# Patient Record
Sex: Male | Born: 1975 | Marital: Single | Smoking: Never smoker
Health system: Southern US, Community
[De-identification: ages and names within clinical notes are randomized; demographics above are authoritative.]

## PROBLEM LIST (undated history)

## (undated) DIAGNOSIS — K219 Gastro-esophageal reflux disease without esophagitis: Secondary | ICD-10-CM

## (undated) DIAGNOSIS — I10 Essential (primary) hypertension: Secondary | ICD-10-CM

## (undated) DIAGNOSIS — M545 Low back pain, unspecified: Secondary | ICD-10-CM

## (undated) DIAGNOSIS — F419 Anxiety disorder, unspecified: Secondary | ICD-10-CM

## (undated) DIAGNOSIS — G4700 Insomnia, unspecified: Secondary | ICD-10-CM

## (undated) DIAGNOSIS — J302 Other seasonal allergic rhinitis: Secondary | ICD-10-CM

## (undated) DIAGNOSIS — E669 Obesity, unspecified: Secondary | ICD-10-CM

## (undated) DIAGNOSIS — N200 Calculus of kidney: Secondary | ICD-10-CM

## (undated) DIAGNOSIS — E785 Hyperlipidemia, unspecified: Secondary | ICD-10-CM

## (undated) HISTORY — DX: Essential (primary) hypertension: I10

## (undated) HISTORY — DX: Gastro-esophageal reflux disease without esophagitis: K21.9

## (undated) HISTORY — DX: Low back pain, unspecified: M54.50

## (undated) HISTORY — PX: NO PAST SURGERIES: SHX2092

## (undated) HISTORY — DX: Obesity, unspecified: E66.9

## (undated) HISTORY — DX: Hyperlipidemia, unspecified: E78.5

## (undated) HISTORY — DX: Calculus of kidney: N20.0

---

## 2006-09-16 ENCOUNTER — Ambulatory Visit: Payer: Self-pay | Admitting: Specialist

## 2007-06-09 ENCOUNTER — Emergency Department: Payer: Self-pay

## 2007-06-10 ENCOUNTER — Emergency Department: Payer: Self-pay | Admitting: Internal Medicine

## 2007-12-26 ENCOUNTER — Emergency Department (HOSPITAL_COMMUNITY): Admission: EM | Admit: 2007-12-26 | Discharge: 2007-12-26 | Payer: Self-pay | Admitting: Emergency Medicine

## 2008-12-14 ENCOUNTER — Ambulatory Visit: Payer: Self-pay | Admitting: Unknown Physician Specialty

## 2010-01-04 ENCOUNTER — Ambulatory Visit: Payer: Self-pay | Admitting: Internal Medicine

## 2011-06-12 ENCOUNTER — Ambulatory Visit (INDEPENDENT_AMBULATORY_CARE_PROVIDER_SITE_OTHER): Payer: BC Managed Care – PPO | Admitting: Internal Medicine

## 2011-06-12 ENCOUNTER — Encounter: Payer: Self-pay | Admitting: Internal Medicine

## 2011-06-12 DIAGNOSIS — G4700 Insomnia, unspecified: Secondary | ICD-10-CM

## 2011-06-12 DIAGNOSIS — E669 Obesity, unspecified: Secondary | ICD-10-CM

## 2011-06-12 DIAGNOSIS — I10 Essential (primary) hypertension: Secondary | ICD-10-CM

## 2011-06-12 DIAGNOSIS — E785 Hyperlipidemia, unspecified: Secondary | ICD-10-CM | POA: Insufficient documentation

## 2011-06-12 MED ORDER — ZOLPIDEM TARTRATE 10 MG PO TABS: 10.0000 mg | ORAL_TABLET | Freq: Every evening | ORAL | Status: DC | PRN

## 2011-06-12 NOTE — Assessment & Plan Note (Signed)
He has lost 15 lbs since his last visit through carbohydrate restriction and exercise .  Reviewed his exercise regimen and suggested that he decrease his regimen from 90 minutes to 60 and increase the frequency to 5 days per week.

## 2011-06-12 NOTE — Patient Instructions (Addendum)
Consider using G2 during your workouts to replace the electrolytes you are losing in your sweat.  Consider shortening your workouts to an hour and increasing the frequency to 5 tmes per week to boost your metabolism and prevent muscle injury  You are doing grreat!  We'll repeat your labs before the holidays.

## 2011-06-12 NOTE — Assessment & Plan Note (Signed)
improved control on current regimen

## 2011-06-12 NOTE — Assessment & Plan Note (Signed)
With elevated triglycerides and LDL by last check.  He would like another 3 months before rechecking.

## 2011-06-12 NOTE — Progress Notes (Signed)
  Subjective:    Patient ID: Mark Welch, male    DOB: September 04, 1976, 35 y.o.   MRN: [id number]  HPI  35 yo hispanic male with history of obesity, hypertension and GERD  With rcenent labs suggesting prediabetes and hypertrigyceridemia, presents for followup .  He has had episodes of  light headedness and muscle cramps.  Both improving with exercise and weight loss.  Has lost 15 lbs in the last month with a low carb diet and exercise .  Goal is 60 more lbs,  Exercising 3 days per week 90 minutes per session.     Review of Systems  Constitutional: Negative for fever, chills, diaphoresis, activity change, appetite change, fatigue and unexpected weight change.  HENT: Negative for hearing loss, ear pain, nosebleeds, congestion, sore throat, facial swelling, rhinorrhea, sneezing, drooling, mouth sores, trouble swallowing, neck pain, neck stiffness, dental problem, voice change, postnasal drip, sinus pressure, tinnitus and ear discharge.   Eyes: Negative for photophobia, pain, discharge, redness, itching and visual disturbance.  Respiratory: Negative for apnea, cough, choking, chest tightness, shortness of breath, wheezing and stridor.   Cardiovascular: Negative for chest pain, palpitations and leg swelling.  Gastrointestinal: Negative for nausea, vomiting, abdominal pain, diarrhea, constipation, blood in stool, abdominal distention, anal bleeding and rectal pain.  Genitourinary: Negative for dysuria, urgency, frequency, hematuria, flank pain, decreased urine volume, scrotal swelling, difficulty urinating and testicular pain.  Musculoskeletal: Positive for myalgias. Negative for back pain, joint swelling, arthralgias and gait problem.  Skin: Negative for color change, rash and wound.  Neurological: Positive for dizziness. Negative for tremors, seizures, syncope, speech difficulty, weakness, light-headedness, numbness and headaches.  Psychiatric/Behavioral: Negative for suicidal ideas, hallucinations,  behavioral problems, confusion, sleep disturbance, dysphoric mood, decreased concentration and agitation. The patient is not nervous/anxious.        Objective:   Physical Exam  Constitutional: He is oriented to person, place, and time.  HENT:  Head: Normocephalic and atraumatic.  Mouth/Throat: Oropharynx is clear and moist.  Eyes: Conjunctivae and EOM are normal.  Neck: Normal range of motion. Neck supple. No JVD present. No thyromegaly present.  Cardiovascular: Normal rate, regular rhythm and normal heart sounds.   Pulmonary/Chest: Effort normal and breath sounds normal. He has no wheezes. He has no rales.  Abdominal: Soft. Bowel sounds are normal. He exhibits no mass. There is no tenderness. There is no rebound.  Musculoskeletal: Normal range of motion. He exhibits no edema.  Neurological: He is alert and oriented to person, place, and time.  Skin: Skin is warm and dry.  Psychiatric: He has a normal mood and affect.          Assessment & Plan:  BP 127/87  Pulse 69  Temp(Src) 97.8 F (36.6 C) (Oral)  Resp 16  Ht 5\' 11"  (1.803 m)  Wt 266 lb 8 oz (120.884 kg)  BMI 37.17 kg/m2  SpO2 97%

## 2011-07-24 ENCOUNTER — Ambulatory Visit: Payer: Self-pay | Admitting: Specialist

## 2011-08-14 ENCOUNTER — Other Ambulatory Visit: Payer: Self-pay | Admitting: Internal Medicine

## 2011-08-21 ENCOUNTER — Other Ambulatory Visit: Payer: BC Managed Care – PPO

## 2011-09-13 ENCOUNTER — Other Ambulatory Visit: Payer: Self-pay | Admitting: Internal Medicine

## 2011-09-20 ENCOUNTER — Telehealth: Payer: Self-pay | Admitting: Internal Medicine

## 2011-09-20 DIAGNOSIS — E785 Hyperlipidemia, unspecified: Secondary | ICD-10-CM

## 2011-09-20 DIAGNOSIS — I10 Essential (primary) hypertension: Secondary | ICD-10-CM

## 2011-09-20 NOTE — Telephone Encounter (Signed)
[phone]  Pt wants to get lab before his appointment on 1/16

## 2011-09-20 NOTE — Telephone Encounter (Signed)
  Morrie Sheldon can you place order for fasting lipids, CM[facility] ? thanks

## 2011-09-23 NOTE — Telephone Encounter (Signed)
Left message asking patient to call back and schedule lab appt.

## 2011-09-23 NOTE — Telephone Encounter (Signed)
Patient returned call. Since it is a fasting lab he can not come this afternoon because he obviously has already eaten and per patient he can not come int tomorrow. I advised him to wait until his appt on wed.

## 2011-09-25 ENCOUNTER — Ambulatory Visit (INDEPENDENT_AMBULATORY_CARE_PROVIDER_SITE_OTHER): Payer: BC Managed Care – PPO | Admitting: Internal Medicine

## 2011-09-25 ENCOUNTER — Encounter: Payer: Self-pay | Admitting: Internal Medicine

## 2011-09-25 VITALS — BP 120/84 | HR 76 | Temp 97.3°F | Wt 274.0 lb

## 2011-09-25 DIAGNOSIS — J069 Acute upper respiratory infection, unspecified: Secondary | ICD-10-CM

## 2011-09-25 DIAGNOSIS — I10 Essential (primary) hypertension: Secondary | ICD-10-CM

## 2011-09-25 DIAGNOSIS — E785 Hyperlipidemia, unspecified: Secondary | ICD-10-CM

## 2011-09-25 DIAGNOSIS — Z23 Encounter for immunization: Secondary | ICD-10-CM

## 2011-09-25 DIAGNOSIS — Z0000 Encounter for general adult medical examination without abnormal findings: Secondary | ICD-10-CM

## 2011-09-25 DIAGNOSIS — E669 Obesity, unspecified: Secondary | ICD-10-CM

## 2011-09-25 LAB — COMPREHENSIVE METABOLIC PANEL
Alkaline Phosphatase: 51 U/L (ref 39–117)
BUN: 14 mg/dL (ref 6–23)
CO2: 22 mEq/L (ref 19–32)
Creatinine, Ser: 0.9 mg/dL (ref 0.4–1.5)
GFR: 96.77 mL/min (ref >60.00)
Glucose, Bld: 135 mg/dL — ABNORMAL HIGH (ref 70–99)
Sodium: 139 mEq/L (ref 135–145)
Total Bilirubin: 1.5 mg/dL — ABNORMAL HIGH (ref 0.3–1.2)
Total Protein: 7.9 g/dL (ref 6.0–8.3)

## 2011-09-25 LAB — HEMOGLOBIN A1C: Hgb A1c MFr Bld: 5.9 % (ref 4.6–6.5)

## 2011-09-25 MED ORDER — AMOXICILLIN-POT CLAVULANATE 875-125 MG PO TABS: 1.0000 | ORAL_TABLET | Freq: Two times a day (BID) | ORAL | Status: AC

## 2011-09-25 NOTE — Patient Instructions (Signed)

## 2011-09-25 NOTE — Progress Notes (Signed)
Subjective:    Patient ID: Mark Welch, male    DOB: [birth year], 36 y.o.   MRN: [id number]  HPI  Mark Welch is a 36 year old Hispanic male with a history of hypertension hyperlipidemia and obesity as well as allergic rhinitis who presents with presents with scratchy throat , rhinitis with clear to yellow/green drainage, and minimal sneezing for the last 2 or 3 days.  He denies any history of productive cough fevers or night sweats and significant myalgias. He has no history of recent sick contacts. He has not had an influenza vaccine this year.  Past Medical History  Diagnosis Date  . Obesity (BMI 30-39.9)   . Hypertension   . Hyperlipidemia   . Kidney stones   . GERD (gastroesophageal reflux disease)     Current Outpatient Prescriptions on File Prior to Visit  Medication Sig Dispense Refill  . amitriptyline (ELAVIL) 25 MG tablet TAKE ONE TABLET BY MOUTH AT BEDTIME  90 tablet  3  . cetirizine (ZYRTEC) 10 MG tablet Take 10 mg by mouth daily.        Marland Kitchen losartan (COZAAR) 50 MG tablet TAKE ONE TABLET BY MOUTH EVERY DAY FOR BLOOD PRESSURE  30 tablet  3  . omeprazole (PRILOSEC) 20 MG capsule Take 20 mg by mouth daily.        Marland Kitchen zolpidem (AMBIEN) 10 MG tablet Take 1 tablet (10 mg total) by mouth at bedtime as needed.  30 tablet  5    Review of Systems  Constitutional: Negative for fever, chills, diaphoresis, activity change, appetite change, fatigue and unexpected weight change.  HENT: Positive for congestion, rhinorrhea and sneezing. Negative for hearing loss, ear pain, nosebleeds, sore throat, facial swelling, drooling, mouth sores, trouble swallowing, neck pain, neck stiffness, dental problem, voice change, postnasal drip, sinus pressure, tinnitus and ear discharge.   Eyes: Negative for photophobia, pain, discharge, redness, itching and visual disturbance.  Respiratory: Negative for apnea, cough, choking, chest tightness, shortness of breath, wheezing and stridor.   Cardiovascular:  Negative for chest pain, palpitations and leg swelling.  Gastrointestinal: Negative for nausea, vomiting, abdominal pain, diarrhea, constipation, blood in stool, abdominal distention, anal bleeding and rectal pain.  Genitourinary: Negative for dysuria, urgency, frequency, hematuria, flank pain, decreased urine volume, scrotal swelling, difficulty urinating and testicular pain.  Musculoskeletal: Negative for myalgias, back pain, joint swelling, arthralgias and gait problem.  Skin: Negative for color change, rash and wound.  Neurological: Negative for dizziness, tremors, seizures, syncope, speech difficulty, weakness, light-headedness, numbness and headaches.  Psychiatric/Behavioral: Negative for suicidal ideas, hallucinations, behavioral problems, confusion, sleep disturbance, dysphoric mood, decreased concentration and agitation. The patient is not nervous/anxious.    BP 120/84  Pulse 76  Temp(Src) 97.3 F (36.3 C) (Oral)  Wt 274 lb (124.286 kg)  SpO2 96%    Objective:   Physical Exam  Constitutional: He is oriented to person, place, and time.  HENT:  Head: Normocephalic and atraumatic.  Mouth/Throat: Oropharynx is clear and moist.  Eyes: Conjunctivae and EOM are normal.  Neck: Normal range of motion. Neck supple. No JVD present. No thyromegaly present.  Cardiovascular: Normal rate, regular rhythm and normal heart sounds.   Pulmonary/Chest: Effort normal and breath sounds normal. He has no wheezes. He has no rales.  Abdominal: Soft. Bowel sounds are normal. He exhibits no mass. There is no tenderness. There is no rebound.  Musculoskeletal: Normal range of motion. He exhibits no edema.  Neurological: He is alert and oriented to person, place, and time.  Skin:  Skin is warm and dry.  Psychiatric: He has a normal mood and affect.      Assessment & Plan:   Acute URI This history and clinical exam are consistent with a viral upper respiratory infection indication and printed information  about management of this illness has been given. He has been given a prescription for an antibiotic if his symptoms were to worsen to the point that he developed fevers above 100.4 severe facial pain or ear pain or purulent discharge that was blood streaked or greenish brown in nature.  Hypertension Well controlled on current regimen no changes today.  Assessment of renal function due today.  Hyperlipidemia He has a history of hypertriglyceridemia and mild hyperlipidemia. He is overdue for fasting labs but is not fasting today. We'll check an LDL today and if significantly elevated we'll have him return for fasting lipid panel.  Obesity (BMI 30-39.9) He has been counseled about his obesity and is making a concerted effort to reduce his weight through diet and exercise. Currently he is exercising 2-3 times daily and following a low carbohydrate diet.    Updated Medication List Outpatient Encounter Prescriptions as of 09/25/2011  Medication Sig Dispense Refill  . amitriptyline (ELAVIL) 25 MG tablet TAKE ONE TABLET BY MOUTH AT BEDTIME  90 tablet  3  . cetirizine (ZYRTEC) 10 MG tablet Take 10 mg by mouth daily.        Marland Kitchen losartan (COZAAR) 50 MG tablet TAKE ONE TABLET BY MOUTH EVERY DAY FOR BLOOD PRESSURE  30 tablet  3  . omeprazole (PRILOSEC) 20 MG capsule Take 20 mg by mouth daily.        Marland Kitchen zolpidem (AMBIEN) 10 MG tablet Take 1 tablet (10 mg total) by mouth at bedtime as needed.  30 tablet  5  . amoxicillin-clavulanate (AUGMENTIN) 875-125 MG per tablet Take 1 tablet by mouth 2 (two) times daily.  14 tablet  0  . DISCONTD: losartan (COZAAR) 25 MG tablet Take 50 mg by mouth daily.

## 2011-09-27 DIAGNOSIS — J069 Acute upper respiratory infection, unspecified: Secondary | ICD-10-CM | POA: Insufficient documentation

## 2011-09-27 NOTE — Assessment & Plan Note (Signed)
This history and clinical exam are consistent with a viral upper respiratory infection indication and printed information about management of this illness has been given. He has been given a prescription for an antibiotic if his symptoms were to worsen to the point that he developed fevers above 100.4 severe facial pain or ear pain or purulent discharge that was blood streaked or greenish brown in nature.

## 2011-09-27 NOTE — Assessment & Plan Note (Signed)
He has been counseled about his obesity and is making a concerted effort to reduce his weight through diet and exercise. Currently he is exercising 2-3 times daily and following a low carbohydrate diet.

## 2011-09-27 NOTE — Assessment & Plan Note (Signed)
He has a history of hypertriglyceridemia and mild hyperlipidemia. He is overdue for fasting labs but is not fasting today. We'll check an LDL today and if significantly elevated we'll have him return for fasting lipid panel.

## 2011-09-27 NOTE — Assessment & Plan Note (Signed)
Well controlled on current regimen no changes today.  Assessment of renal function due today.

## 2011-12-11 ENCOUNTER — Other Ambulatory Visit: Payer: Self-pay | Admitting: Internal Medicine

## 2012-01-01 ENCOUNTER — Other Ambulatory Visit (INDEPENDENT_AMBULATORY_CARE_PROVIDER_SITE_OTHER): Payer: BC Managed Care – PPO | Admitting: *Deleted

## 2012-01-01 DIAGNOSIS — I10 Essential (primary) hypertension: Secondary | ICD-10-CM

## 2012-01-01 DIAGNOSIS — E669 Obesity, unspecified: Secondary | ICD-10-CM

## 2012-01-01 DIAGNOSIS — E785 Hyperlipidemia, unspecified: Secondary | ICD-10-CM

## 2012-01-01 LAB — HEMOGLOBIN A1C: Hgb A1c MFr Bld: 6.2 % (ref 4.6–6.5)

## 2012-01-01 LAB — COMPREHENSIVE METABOLIC PANEL
ALT: 41 U/L (ref 0–53)
AST: 32 U/L (ref 0–37)
Albumin: 3.8 g/dL (ref 3.5–5.2)
CO2: 26 mEq/L (ref 19–32)
Calcium: 9.3 mg/dL (ref 8.4–10.5)
Chloride: 105 mEq/L (ref 96–112)
Potassium: 4.2 mEq/L (ref 3.5–5.1)
Total Protein: 7.9 g/dL (ref 6.0–8.3)

## 2012-01-01 LAB — LIPID PANEL: Total CHOL/HDL Ratio: 7

## 2012-01-02 ENCOUNTER — Other Ambulatory Visit: Payer: Self-pay | Admitting: Internal Medicine

## 2012-01-03 MED ORDER — ZOLPIDEM TARTRATE 10 MG PO TABS: 10.0000 mg | ORAL_TABLET | Freq: Every evening | ORAL | Status: DC | PRN

## 2012-01-03 MED ORDER — OMEPRAZOLE 40 MG PO CPDR: 40.0000 mg | DELAYED_RELEASE_CAPSULE | Freq: Every day | ORAL | Status: DC

## 2012-01-03 MED ORDER — LOSARTAN POTASSIUM 50 MG PO TABS: 50.0000 mg | ORAL_TABLET | Freq: Every day | ORAL | Status: DC

## 2012-07-21 ENCOUNTER — Other Ambulatory Visit: Payer: Self-pay | Admitting: Internal Medicine

## 2012-07-22 ENCOUNTER — Other Ambulatory Visit: Payer: Self-pay

## 2012-07-31 ENCOUNTER — Ambulatory Visit (INDEPENDENT_AMBULATORY_CARE_PROVIDER_SITE_OTHER): Payer: BC Managed Care – PPO | Admitting: Internal Medicine

## 2012-07-31 ENCOUNTER — Encounter: Payer: Self-pay | Admitting: Internal Medicine

## 2012-07-31 VITALS — BP 130/80 | HR 90 | Temp 98.5°F | Resp 12 | Ht 71.5 in | Wt 271.5 lb

## 2012-07-31 DIAGNOSIS — I10 Essential (primary) hypertension: Secondary | ICD-10-CM

## 2012-07-31 DIAGNOSIS — E119 Type 2 diabetes mellitus without complications: Secondary | ICD-10-CM

## 2012-07-31 DIAGNOSIS — E785 Hyperlipidemia, unspecified: Secondary | ICD-10-CM

## 2012-07-31 DIAGNOSIS — E669 Obesity, unspecified: Secondary | ICD-10-CM

## 2012-07-31 DIAGNOSIS — M25539 Pain in unspecified wrist: Secondary | ICD-10-CM

## 2012-07-31 LAB — COMPREHENSIVE METABOLIC PANEL
ALT: 31 U/L (ref 0–53)
AST: 28 U/L (ref 0–37)
Albumin: 3.9 g/dL (ref 3.5–5.2)
CO2: 25 mEq/L (ref 19–32)
Calcium: 9.2 mg/dL (ref 8.4–10.5)
Chloride: 105 mEq/L (ref 96–112)
Creatinine, Ser: 1 mg/dL (ref 0.4–1.5)
GFR: 90.69 mL/min (ref >60.00)
Potassium: 3.9 mEq/L (ref 3.5–5.1)

## 2012-07-31 LAB — HEMOGLOBIN A1C: Hgb A1c MFr Bld: 6 % (ref 4.6–6.5)

## 2012-07-31 LAB — LIPID PANEL
Cholesterol: 218 mg/dL — ABNORMAL HIGH (ref 0–200)
Total CHOL/HDL Ratio: 8
VLDL: 46.4 mg/dL — ABNORMAL HIGH (ref 0.0–40.0)

## 2012-07-31 NOTE — Progress Notes (Signed)
Patient ID: Mark Welch, male   DOB: [birth year], 36 y.o.   MRN: [id number]  Patient Active Problem List  Diagnosis  . Obesity (BMI 30-39.9)  . Hypertension  . Hyperlipidemia  . Acute URI  . Diabetes mellitus type 2, diet-controlled    Subjective:  CC:   Chief Complaint  Patient presents with  . Medication Refill    HPI:   Mark Welch is a 36 y.o. male who presents for follow up on diet controlled diabetes mellitus, obesity, hypertension and dyslipidemia.  He has no specific complaints today he has not been following a low glycemic index diet or exercising regularly due to work and other distractions. He denies any joint pain and insomnia urinary frequency and chest pain. He does not check his blood sugars since he is diet controlled. He has no history of neuropathy and nephropathy or use of medications to control diabetes.   Past Medical History  Diagnosis Date  . Obesity (BMI 30-39.9)   . Hypertension   . Hyperlipidemia   . Kidney stones   . GERD (gastroesophageal reflux disease)     History reviewed. No pertinent past surgical history.       The following portions of the patient's history were reviewed and updated as appropriate: Allergies, current medications, and problem list.    Review of Systems:  Patient denies headache, fevers, malaise, unintentional weight loss, skin rash, eye pain, sinus congestion and sinus pain, sore throat, dysphagia,  hemoptysis , cough, dyspnea, wheezing, chest pain, palpitations, orthopnea, edema, abdominal pain, nausea, melena, diarrhea, constipation, flank pain, dysuria, hematuria, urinary  Frequency, nocturia, numbness, tingling, seizures,  Focal weakness, Loss of consciousness,  Tremor, insomnia, depression, anxiety, and suicidal ideation.       History   Social History  . Marital Status: Single    Spouse Name: N/A    Number of Children: N/A  . Years of Education: N/A   Occupational History  . Not on file.    Social History Main Topics  . Smoking status: Never Smoker   . Smokeless tobacco: Never Used  . Alcohol Use: No  . Drug Use: No  . Sexually Active: Not on file   Other Topics Concern  . Not on file   Social History Narrative  . No narrative on file    Objective:  BP 130/80  Pulse 90  Temp 98.5 F (36.9 C) (Oral)  Resp 12  Ht 5' 11.5" (1.816 m)  Wt 271 lb 8 oz (123.152 kg)  BMI 37.34 kg/m2  SpO2 97%  General appearance: alert, cooperative and appears stated age Ears: normal TM's and external ear canals both ears Throat: lips, mucosa, and tongue normal; teeth and gums normal Neck: no adenopathy, no carotid bruit, supple, symmetrical, trachea midline and thyroid not enlarged, symmetric, no tenderness/mass/nodules Back: symmetric, no curvature. ROM normal. No CVA tenderness. Lungs: clear to auscultation bilaterally Heart: regular rate and rhythm, S1, S2 normal, no murmur, click, rub or gallop Abdomen: soft, non-tender; bowel sounds normal; no masses,  no organomegaly Pulses: 2+ and symmetric Skin: Skin color, texture, turgor normal. No rashes or lesions Lymph nodes: Cervical, supraclavicular, and axillary nodes normal.  Assessment and Plan:  Obesity (BMI 30-39.9) His BMI remains unchanged  And is 37  We discussed the nature and quality of his exercise regimen as well his diet.  He should strive to achieve a sustained heart rate in the aerobic zone for 20 to 30 minutes,  5 days per week as a goal. .I  also am advising him to get back on the low GI diet using six smaller meals a day to stimulate his metabolism. He is not a candidate for appetite suppressants given his high resting heart rate and history of hypertension   Hyperlipidemia He has dyslipidemia with elevated triglycerides, LDLof 166  and low HDL. Low GI diet recommended. Will recommend statin therapy with pravastatin for starting therapy and repeat lipids in 3 months   Diabetes mellitus type 2,  diet-controlled Diagnosis with fasting glucose of 135 in January. His diabetes remains well controlled on diet alone. Hemoglobin A1c 6.0. There is no signs of proteinuria or nephropathy. Foot exam today was normal.  Hypertension Well controlled on current medications.  No changes today.   Updated Medication List Outpatient Encounter Prescriptions as of 07/31/2012  Medication Sig Dispense Refill  . amitriptyline (ELAVIL) 25 MG tablet TAKE ONE TABLET BY MOUTH AT BEDTIME  90 tablet  3  . cetirizine (ZYRTEC) 10 MG tablet Take 10 mg by mouth daily.        Marland Kitchen losartan (COZAAR) 50 MG tablet TAKE ONE TABLET BY MOUTH EVERY DAY  30 tablet  2  . omeprazole (PRILOSEC) 40 MG capsule Take 1 capsule (40 mg total) by mouth daily.  30 capsule  5  . zolpidem (AMBIEN) 10 MG tablet TAKE ONE TABLET BY MOUTH AT BEDTIME AS NEEDED  30 tablet  2  . omeprazole (PRILOSEC) 20 MG capsule Take 20 mg by mouth daily.        . pravastatin (PRAVACHOL) 20 MG tablet Take 1 tablet (20 mg total) by mouth daily.  90 tablet  3     Orders Placed This Encounter  Procedures  . DG Wrist Complete Left  . Hemoglobin A1c  . Lipid panel  . Comprehensive metabolic panel  . TSH  . Microalbumin / creatinine urine ratio  . LDL cholesterol, direct  . HM DIABETES EYE EXAM    No Follow-up on file.

## 2012-07-31 NOTE — Patient Instructions (Addendum)
Your weight has increased again.  I  Recommend you resume your exercise program with a goal of 25 lb st loss over the next 6 months.    This is  my version of a  "Low GI"  Diet:  All of the foods can be found at grocery stores and in bulk at [employer].  The Atkins protein bars and shakes are available in more varieties at [employer], [employer] and [employer].     7 AM Breakfast:  Low carbohydrate Protein  Shakes (I recommend the [employer] AdvantEdge "Carb Control" shakes  Or the low carb shakes by Atkins.   Both are available everywhere:  In  cases at [employer]  Or in 4 packs at grocery stores and pharmacies  2.5 carbs  (Alternative is  a toasted Arnold's Sandwhich Thin w/ peanut butter, a "Bagel Thin" with cream cheese and salmon) or  a scrambled egg burrito made with a low carb tortilla .  Avoid cereal and bananas, oatmeal too unless you are cooking the old fashioned kind that takes 30-40 minutes to prepare.  the rest is overly processed, has minimal fiber, and is loaded with carbohydrates!   10 AM: Protein bar by Atkins (the snack size, under 200 cal).  There are many varieties , available widely again or in bulk in limited varieties at [employer]Js)  Other so called "protein bars" tend to be loaded with carbohydrates.  Remember, in food advertising, the word "energy" is synonymous for " carbohydrate."  Lunch: sandwich of [place], (or any lunchmeat, grilled meat or canned tuna), fresh avocado, mayonnaise  and cheese on a lower carbohydrate pita bread, flatbread, or tortilla . Ok to use regular mayonnaise. The bread is the only source or carbohydrate that can be decreased (Joseph's makes a pita bread and a flat bread that are 50 cal and 4 net carbs ; Toufayan makes a low carb flatbread that's 100 cal and 9 net carbs  and  [place] makes a low carb whole wheat tortilla  That is 210 cal and 6 net carbs)  3 PM:  Mid day :  Another protein bar,  Or a  cheese stick (100 cal, 0 carbs),  Or 1 ounce of  almonds, walnuts, pistachios,  pecans, peanuts,  Macadamia nuts. Or a Dannon light n Fit greek yogurt, 80 cal 8 net carbs . Avoid "granola"; the dried cranberries and raisins are loaded with carbohydrates. Mixed nuts ok if no raisins or cranberries or dried fruit.      6 PM  Dinner:  "mean and green:"  Meat/chicken/fish or a high protein legume; , with a green salad, and a low GI  Veggie (broccoli, cauliflower, green beans, spinach, brussel sprouts. Lima beans) : Avoid "Low fat dressings, as well as Reyne Dumas and [address]! They are loaded with sugar! Instead use ranch, vinagrette,  Blue cheese, etc.  There is a low carb pasta by Dreamfield's available at [facility] that is acceptable and tastes great. Try Michel Angel's chicken piccata over low carb pasta. The chicken dish is 0 carbs, and can be found in frozen section at [employer]Js and [employer]. Also try [employer] "Carnitas" (pulled pork, no sauce,  0 carbs) and his pot roast.   both are in the refrigerated section at BJs   9 PM snack : Breyer's "low carb" fudgsicle or  ice cream bar (Carb Smart line), or  Weight Watcher's ice cream bar , or another "no sugar added" ice cream;a serving of fresh berries/cherries with whipped  cream (Avoid bananas, pineapple, grapes  and watermelon on a regular basis because they are high in sugar)   Remember that snack Substitutions should be less than 15 to 20 carbs  Per serving. Remember to subtract fiber grams and sugar alcohols to get the "net carbs."

## 2012-08-02 ENCOUNTER — Encounter: Payer: Self-pay | Admitting: Internal Medicine

## 2012-08-02 DIAGNOSIS — E119 Type 2 diabetes mellitus without complications: Secondary | ICD-10-CM | POA: Insufficient documentation

## 2012-08-02 MED ORDER — PRAVASTATIN SODIUM 20 MG PO TABS: 20.0000 mg | ORAL_TABLET | Freq: Every day | ORAL | Status: DC

## 2012-08-02 NOTE — Assessment & Plan Note (Signed)
Well controlled on current medications.  No changes today. 

## 2012-08-02 NOTE — Assessment & Plan Note (Addendum)
He has dyslipidemia with elevated triglycerides, LDLof 166  and low HDL. Low GI diet recommended. Will recommend statin therapy with pravastatin for starting therapy and repeat lipids in 3 months

## 2012-08-02 NOTE — Assessment & Plan Note (Signed)
Diagnosis with fasting glucose of 135 in January. His diabetes remains well controlled on diet alone. Hemoglobin A1c 6.0. There is no signs of proteinuria or nephropathy. Foot exam today was normal.

## 2012-08-02 NOTE — Assessment & Plan Note (Addendum)
His BMI remains unchanged  And is 37  We discussed the nature and quality of his exercise regimen as well his diet.  He should strive to achieve a sustained heart rate in the aerobic zone.I also am advising him to get back on the low GI diet using six smaller meals a day to stimulate his metabolism. He is not a candidate for appetite suppressants given his high resting heart rate and history of hypertension

## 2012-08-03 ENCOUNTER — Ambulatory Visit: Payer: Self-pay | Admitting: Internal Medicine

## 2012-08-04 ENCOUNTER — Telehealth: Payer: Self-pay | Admitting: Internal Medicine

## 2012-08-04 DIAGNOSIS — M25532 Pain in left wrist: Secondary | ICD-10-CM | POA: Insufficient documentation

## 2012-08-04 NOTE — Telephone Encounter (Signed)
There are no signs of erosive arthritis, fracture or dislocation by plain films.  Advise trial of tylenol and ibuprofen as needed

## 2012-08-04 NOTE — Assessment & Plan Note (Signed)
No signs of erosive arthritis, fracture or dislocation by plain films.

## 2012-08-04 NOTE — Telephone Encounter (Signed)
Patient advised as instructed 

## 2012-08-10 ENCOUNTER — Encounter: Payer: Self-pay | Admitting: Internal Medicine

## 2012-09-18 ENCOUNTER — Encounter: Payer: Self-pay | Admitting: Internal Medicine

## 2012-09-18 ENCOUNTER — Ambulatory Visit (INDEPENDENT_AMBULATORY_CARE_PROVIDER_SITE_OTHER): Payer: BC Managed Care – PPO | Admitting: Internal Medicine

## 2012-09-18 VITALS — BP 120/84 | HR 80 | Temp 98.2°F | Resp 16 | Wt 274.2 lb

## 2012-09-18 DIAGNOSIS — M7050 Other bursitis of knee, unspecified knee: Secondary | ICD-10-CM

## 2012-09-18 DIAGNOSIS — E669 Obesity, unspecified: Secondary | ICD-10-CM

## 2012-09-18 DIAGNOSIS — I10 Essential (primary) hypertension: Secondary | ICD-10-CM

## 2012-09-18 DIAGNOSIS — M76899 Other specified enthesopathies of unspecified lower limb, excluding foot: Secondary | ICD-10-CM

## 2012-09-18 LAB — HM DIABETES EYE EXAM: HM Diabetic Eye Exam: NORMAL

## 2012-09-18 MED ORDER — MELOXICAM 15 MG PO TABS: 15.0000 mg | ORAL_TABLET | Freq: Every day | ORAL | Status: DC

## 2012-09-18 NOTE — Progress Notes (Signed)
Patient ID: Mark Welch, male   DOB: 1976-07-01, 37 y.o.   MRN: [id number]  Patient Active Problem List  Diagnosis  . Obesity (BMI 30-39.9)  . Hypertension  . Hyperlipidemia  . Diabetes mellitus type 2, diet-controlled  . Wrist pain, left  . Bursitis of knee    Subjective:  CC:   Chief Complaint  Patient presents with  . Knee pain    HPI:   Mark Welch a 37 y.o. male who presents Recurrent knee pain ,  Left knee.  Also having pain in his left quadriceps muscle.  His knee pain is aggravated by walking.  Using the elliptical machine which does not bother it. He notices  Anterior swelling at times and popping,  No giving way. Has not tried NSAIDs daily but last night tried ibuprofen 200 mg dose which did not help .  No history of trauma.    Past Medical History  Diagnosis Date  . Obesity (BMI 30-39.9)   . Hypertension   . Hyperlipidemia   . Kidney stones   . GERD (gastroesophageal reflux disease)     History reviewed. No pertinent past surgical history.       The following portions of the patient's history were reviewed and updated as appropriate: Allergies, current medications, and problem list.    Review of Systems:   12 Pt  review of systems was negative except those addressed in the HPI,     History   Social History  . Marital Status: Single    Spouse Name: N/A    Number of Children: N/A  . Years of Education: N/A   Occupational History  . Not on file.   Social History Main Topics  . Smoking status: Never Smoker   . Smokeless tobacco: Never Used  . Alcohol Use: No  . Drug Use: No  . Sexually Active: Not on file   Other Topics Concern  . Not on file   Social History Narrative  . No narrative on file    Objective:  BP 120/84  Pulse 80  Temp 98.2 F (36.8 C) (Oral)  Resp 16  Wt 274 lb 4 oz (124.399 kg)  SpO2 96%  General appearance: alert, cooperative and appears stated age Ears: normal TM's and external ear canals both  ears Throat: lips, mucosa, and tongue normal; teeth and gums normal Neck: no adenopathy, no carotid bruit, supple, symmetrical, trachea midline and thyroid not enlarged, symmetric, no tenderness/mass/nodules Back: symmetric, no curvature. ROM normal. No CVA tenderness. Lungs: clear to auscultation bilaterally Heart: regular rate and rhythm, S1, S2 normal, no murmur, click, rub or gallop Abdomen: soft, non-tender; bowel sounds normal; no masses,  no organomegaly Pulses: 2+ and symmetric Skin: Skin color, texture, turgor normal. No rashes or lesions Lymph nodes: Cervical, supraclavicular, and axillary nodes normal. MSK: left knee; normal anatomic markings, no effusion, no ligament laxity.  Assessment and Plan:  Bursitis of knee Recommended therapeutic strength NSAIDs for four weeks and avoidance of squats and lunges.   Knee extensions ok with very light weights.  Ice knee after exercise.  Obesity (BMI 30-39.9) His BMI remains unchanged despite exercise. We discussed the nature and quality of her exercises well as of his diet. Recommended using the machines that adjust pace and resistance to maintain his pulse in the aerobic zone.   I also am advising him to get back on the low GI diet using six smaller meals a day to stimulate her metabolism.    Hypertension Well controlled on  current regimen. Renal function stable, no changes today.   Updated Medication List Outpatient Encounter Prescriptions as of 09/18/2012  Medication Sig Dispense Refill  . amitriptyline (ELAVIL) 25 MG tablet TAKE ONE TABLET BY MOUTH AT BEDTIME  90 tablet  3  . cetirizine (ZYRTEC) 10 MG tablet Take 10 mg by mouth daily.        Marland Kitchen losartan (COZAAR) 50 MG tablet TAKE ONE TABLET BY MOUTH EVERY DAY  30 tablet  2  . omeprazole (PRILOSEC) 40 MG capsule Take 1 capsule (40 mg total) by mouth daily.  30 capsule  5  . pravastatin (PRAVACHOL) 20 MG tablet Take 1 tablet (20 mg total) by mouth daily.  90 tablet  3  . zolpidem  (AMBIEN) 10 MG tablet TAKE ONE TABLET BY MOUTH AT BEDTIME AS NEEDED  30 tablet  2  . meloxicam (MOBIC) 15 MG tablet Take 1 tablet (15 mg total) by mouth daily.  90 tablet  1  . [DISCONTINUED] omeprazole (PRILOSEC) 20 MG capsule Take 20 mg by mouth daily.           No orders of the defined types were placed in this encounter.    No Follow-up on file.

## 2012-09-18 NOTE — Patient Instructions (Addendum)
You knee pain is due to bursitis.  We will treat for 4 to 6 weeks with NSAID (meloxicam)  You can still go to the gym but avoid lunges and squats,  Ok to use the  recumbent bike  Ok to add tylenol up to 200 mg daily max dose ( 500 or 1000 mg twice daily ok to add)   Ok to add knee extensions with very low weight to strengthen quads.   Ice your knee for 15 minutes after every workout.  If no better in 4 weeks ,  Call and we will set you up to see Dr Zachery Dauer,  Sports Medicine  Return inlate Feb or early march for lab visit to repeat fasting lipids, A1c.   Look for [employer] pasta to help lower carb intake ([employer],  [employer])

## 2012-09-20 ENCOUNTER — Encounter: Payer: Self-pay | Admitting: Internal Medicine

## 2012-09-20 NOTE — Assessment & Plan Note (Signed)
His BMI remains unchanged despite exercise. We discussed the nature and quality of her exercises well as of his diet. Recommended using the machines that adjust pace and resistance to maintain his pulse in the aerobic zone.   I also am advising him to get back on the low GI diet using six smaller meals a day to stimulate her metabolism.

## 2012-09-20 NOTE — Assessment & Plan Note (Signed)
Well controlled on current regimen. Renal function stable, no changes today. 

## 2012-09-20 NOTE — Assessment & Plan Note (Addendum)
Recommended therapeutic strength NSAIDs for four weeks and avoidance of squats and lunges.   Knee extensions ok with very light weights.  Ice knee after exercise.

## 2012-10-27 ENCOUNTER — Telehealth: Payer: Self-pay | Admitting: Internal Medicine

## 2012-10-27 DIAGNOSIS — E119 Type 2 diabetes mellitus without complications: Secondary | ICD-10-CM

## 2012-10-27 DIAGNOSIS — E785 Hyperlipidemia, unspecified: Secondary | ICD-10-CM

## 2012-10-27 NOTE — Telephone Encounter (Signed)
His insurance will not pay for his a1c becaseu it has not been 90 days  (11/22 was his last lab) , he needs to wait until  Feb 22

## 2012-10-27 NOTE — Telephone Encounter (Signed)
Left detailed message on patient's voice mail.

## 2012-10-27 NOTE — Addendum Note (Signed)
Addended by: Sherlene Shams on: 10/27/2012 05:34 PM   Modules accepted: Orders

## 2012-10-27 NOTE — Telephone Encounter (Signed)
Pt is coming in for labs tomorrow (02.19.2014) what labs and dx would you like? Thank you

## 2012-10-27 NOTE — Telephone Encounter (Signed)
Pt sched lab appt for 2/19, states should be fasting labs.

## 2012-10-28 ENCOUNTER — Other Ambulatory Visit: Payer: BC Managed Care – PPO

## 2012-11-02 ENCOUNTER — Other Ambulatory Visit (INDEPENDENT_AMBULATORY_CARE_PROVIDER_SITE_OTHER): Payer: BC Managed Care – PPO

## 2012-11-02 DIAGNOSIS — E785 Hyperlipidemia, unspecified: Secondary | ICD-10-CM

## 2012-11-02 DIAGNOSIS — E119 Type 2 diabetes mellitus without complications: Secondary | ICD-10-CM

## 2012-11-02 LAB — HEMOGLOBIN A1C: Hgb A1c MFr Bld: 6 % (ref 4.6–6.5)

## 2012-11-02 LAB — COMPREHENSIVE METABOLIC PANEL
ALT: 36 U/L (ref 0–53)
AST: 29 U/L (ref 0–37)
Albumin: 3.7 g/dL (ref 3.5–5.2)
Alkaline Phosphatase: 46 U/L (ref 39–117)
Calcium: 9.1 mg/dL (ref 8.4–10.5)
Chloride: 106 mEq/L (ref 96–112)
Potassium: 3.8 mEq/L (ref 3.5–5.1)
Sodium: 139 mEq/L (ref 135–145)
Total Protein: 7.5 g/dL (ref 6.0–8.3)

## 2012-11-02 LAB — LIPID PANEL
Cholesterol: 203 mg/dL — ABNORMAL HIGH (ref 0–200)
Total CHOL/HDL Ratio: 7

## 2012-11-05 ENCOUNTER — Ambulatory Visit (INDEPENDENT_AMBULATORY_CARE_PROVIDER_SITE_OTHER): Payer: BC Managed Care – PPO | Admitting: Internal Medicine

## 2012-11-05 ENCOUNTER — Encounter: Payer: Self-pay | Admitting: Internal Medicine

## 2012-11-05 VITALS — BP 120/98 | HR 85 | Temp 98.3°F | Resp 12 | Wt 274.0 lb

## 2012-11-05 DIAGNOSIS — E119 Type 2 diabetes mellitus without complications: Secondary | ICD-10-CM

## 2012-11-05 DIAGNOSIS — J069 Acute upper respiratory infection, unspecified: Secondary | ICD-10-CM

## 2012-11-05 DIAGNOSIS — E785 Hyperlipidemia, unspecified: Secondary | ICD-10-CM

## 2012-11-05 MED ORDER — ZOLPIDEM TARTRATE 10 MG PO TABS: 10.0000 mg | ORAL_TABLET | Freq: Every evening | ORAL | Status: DC | PRN

## 2012-11-05 MED ORDER — AMITRIPTYLINE HCL 25 MG PO TABS: 25.0000 mg | ORAL_TABLET | Freq: Every day | ORAL | Status: DC

## 2012-11-05 MED ORDER — LOSARTAN POTASSIUM 50 MG PO TABS: 50.0000 mg | ORAL_TABLET | Freq: Every day | ORAL | Status: DC

## 2012-11-05 MED ORDER — OMEPRAZOLE 40 MG PO CPDR: 40.0000 mg | DELAYED_RELEASE_CAPSULE | Freq: Every day | ORAL | Status: DC

## 2012-11-05 NOTE — Progress Notes (Signed)
Patient ID: Mark Welch, male   DOB: May 26, 1976, 37 y.o.   MRN: [id number]  Patient Active Problem List  Diagnosis  . Obesity (BMI 30-39.9)  . Hypertension  . Dyslipidemia (high LDL; low HDL)  . Diabetes mellitus type 2, diet-controlled  . Wrist pain, left  . Bursitis of knee  . Acute upper respiratory infections of unspecified site    Subjective:  CC:   Chief Complaint  Patient presents with  . Follow-up    HPI:   Mark Welch a 37 y.o. male who presents  For three-month followup on diabetes mellitus, hypertension, hyperlipidemia, and obesity. He has been exercising regularly and following a low glycemic index diet. He has had no low blood sugars. He continues to have occasional wrist and knee pain due to bursitis. This is managed with meloxicam. He has been noticing a dry throat in the mornings for the last week which usually resolves by mid day  And is accompanied by sinus congestion without purulent drainage .  He has been told that he snores.   Past Medical History  Diagnosis Date  . Obesity (BMI 30-39.9)   . Hypertension   . Hyperlipidemia   . Kidney stones   . GERD (gastroesophageal reflux disease)     History reviewed. No pertinent past surgical history.     The following portions of the patient's history were reviewed and updated as appropriate: Allergies, current medications, and problem list.    Review of Systems:  Patient denies headache, fevers, malaise, unintentional weight loss, skin rash, eye pain, sinus congestion and sinus pain, sore throat, dysphagia,  hemoptysis , cough, dyspnea, wheezing, chest pain, palpitations, orthopnea, edema, abdominal pain, nausea, melena, diarrhea, constipation, flank pain, dysuria, hematuria, urinary  Frequency, nocturia, numbness, tingling, seizures,  Focal weakness, Loss of consciousness,  Tremor, insomnia, depression, anxiety, and suicidal ideation.     History   Social History  . Marital Status: Single    Spouse Name: N/A    Number of Children: N/A  . Years of Education: N/A   Occupational History  . Not on file.   Social History Main Topics  . Smoking status: Never Smoker   . Smokeless tobacco: Never Used  . Alcohol Use: No  . Drug Use: No  . Sexually Active: Not on file   Other Topics Concern  . Not on file   Social History Narrative  . No narrative on file    Objective:  BP 120/98  Pulse 85  Temp(Src) 98.3 F (36.8 C) (Tympanic)  Resp 12  Wt 274 lb (124.286 kg)  BMI 37.69 kg/m2  SpO2 98%  General appearance: alert, cooperative and appears stated age Ears: normal TM's and external ear canals both ears Throat: lips, mucosa, and tongue normal; teeth and gums normal Neck: no adenopathy, no carotid bruit, supple, symmetrical, trachea midline and thyroid not enlarged, symmetric, no tenderness/mass/nodules Back: symmetric, no curvature. ROM normal. No CVA tenderness. Lungs: clear to auscultation bilaterally Heart: regular rate and rhythm, S1, S2 normal, no murmur, click, rub or gallop Abdomen: soft, non-tender; bowel sounds normal; no masses,  no organomegaly Pulses: 2+ and symmetric Skin: Skin color, texture, turgor normal. No rashes or lesions Lymph nodes: Cervical, supraclavicular, and axillary nodes normal.  Assessment and Plan:  Diabetes mellitus type 2, diet-controlled Well-controlled on diet alone   hemoglobin A1c has been consistently less than 7.0 . He is up-to-date on eye exams and his foot exam is normal  He has a normal urine microalbumin to creatinine  ratio . He is on the appropriate medications he was reminded to take a baby aspirin daily.  Dyslipidemia (high LDL; low HDL) He has lowered his LDL to 135 and raised his HDL to 30 using diet and exercise and low dose pravastatin . New ACC guidelines recommend starting patients aged 23 or higher on moderate intensity statin therapy for diabetes and concurrent LDLbetween 70-189. He's currently taking pravastatin  20 mg daily. Given his young age and lack of strong family history we will continue pravastatin at this time .    Acute upper respiratory infections of unspecified site His exam is benign in his symptoms are mild.. Advised to use oral and nasal decongestants,  Ibuprofen 400 mg and tylenol 650 mq 8 hrs for aches and pains,  Delsym every 8 hours prn cough  Advised to call for  abx only if symptoms worsen to include fevers, facial pain, purulent sputum./drainage.    Updated Medication List Outpatient Encounter Prescriptions as of 11/05/2012  Medication Sig Dispense Refill  . amitriptyline (ELAVIL) 25 MG tablet Take 1 tablet (25 mg total) by mouth at bedtime.  90 tablet  3  . cetirizine (ZYRTEC) 10 MG tablet Take 10 mg by mouth daily.        Marland Kitchen losartan (COZAAR) 50 MG tablet Take 1 tablet (50 mg total) by mouth daily.  90 tablet  3  . meloxicam (MOBIC) 15 MG tablet Take 1 tablet (15 mg total) by mouth daily.  90 tablet  1  . omeprazole (PRILOSEC) 40 MG capsule Take 1 capsule (40 mg total) by mouth daily.  90 capsule  3  . pravastatin (PRAVACHOL) 20 MG tablet Take 1 tablet (20 mg total) by mouth daily.  90 tablet  3  . zolpidem (AMBIEN) 10 MG tablet Take 1 tablet (10 mg total) by mouth at bedtime as needed.  30 tablet  2  . [DISCONTINUED] amitriptyline (ELAVIL) 25 MG tablet TAKE ONE TABLET BY MOUTH AT BEDTIME  90 tablet  3  . [DISCONTINUED] losartan (COZAAR) 50 MG tablet TAKE ONE TABLET BY MOUTH EVERY DAY  30 tablet  2  . [DISCONTINUED] omeprazole (PRILOSEC) 40 MG capsule Take 1 capsule (40 mg total) by mouth daily.  30 capsule  5  . [DISCONTINUED] zolpidem (AMBIEN) 10 MG tablet TAKE ONE TABLET BY MOUTH AT BEDTIME AS NEEDED  30 tablet  2  . aspirin 81 MG EC tablet Take 1 tablet (81 mg total) by mouth daily. Swallow whole.  30 tablet  12   No facility-administered encounter medications on file as of 11/05/2012.     Orders Placed This Encounter  Procedures  . HM DIABETES FOOT EXAM    No  Follow-up on file.

## 2012-11-05 NOTE — Patient Instructions (Addendum)
Try Sudafed PE 10 mg  (up to 3 every 6 hours for congestion)  Try Simply Saline to flush Sinuses   If you develop ear  Or facial pain ,  Or bloody nasal dsicahrge call for an antibiotic  Your cholewterol and diabetes are excellent!

## 2012-11-07 ENCOUNTER — Encounter: Payer: Self-pay | Admitting: Internal Medicine

## 2012-11-07 DIAGNOSIS — J069 Acute upper respiratory infection, unspecified: Secondary | ICD-10-CM | POA: Insufficient documentation

## 2012-11-07 MED ORDER — ASPIRIN 81 MG PO TBEC: 81.0000 mg | DELAYED_RELEASE_TABLET | Freq: Every day | ORAL | Status: DC

## 2012-11-07 NOTE — Assessment & Plan Note (Signed)
He has lowered his LDL to 135 and raised his HDL to 30 using diet and exercise and low dose pravastatin . New ACC guidelines recommend starting patients aged 39 or higher on moderate intensity statin therapy for diabetes and concurrent LDLbetween 70-189. He's currently taking pravastatin 20 mg daily. Given his young age and lack of strong family history we will continue pravastatin at this time .

## 2012-11-07 NOTE — Assessment & Plan Note (Addendum)
Well-controlled on diet alone   hemoglobin A1c has been consistently less than 7.0 . He is up-to-date on eye exams and his foot exam is normal  He has a normal urine microalbumin to creatinine ratio . He is on the appropriate medications he was reminded to take a baby aspirin daily.

## 2012-11-07 NOTE — Assessment & Plan Note (Signed)
His exam is benign in his symptoms are mild.. Advised to use oral and nasal decongestants,  Ibuprofen 400 mg and tylenol 650 mq 8 hrs for aches and pains,  Delsym every 8 hours prn cough  Advised to call for  abx only if symptoms worsen to include fevers, facial pain, purulent sputum./drainage.

## 2012-11-10 ENCOUNTER — Telehealth: Payer: Self-pay | Admitting: Internal Medicine

## 2012-11-10 NOTE — Telephone Encounter (Signed)
Caller: Chrisotpher/Patient; PCP: Duncan Dull (Adults only); CB#: [phone]; Call regarding Sore Throat;   Today, 11/10/2012 , pt calling  stating he had physical on 11/05/2012  and had mentioned dry throat , with sinus congestion x 1 week. Still  with scratchy throat/ nasal congestion, but hasn't had tried decongestant as suggested by Dr. Darrick Huntsman.  Also with frontal headache, but hasn't tried Tylenol either.  RN reached See in 24 hours  pressure/ pain above eyes with yellow/ green drainage per Upper Respiratory Infection protocol.  Pt wants to try home care tonight and  wants note sent to Dr. Darrick Huntsman about antibiotic that she mentioned at last OV.   RN advised note to Dr. Darrick Huntsman will be sent  after hours , but will not be seen until 11/11/2012  when office reopens. OFFICE FOLLOWUP REQUEST

## 2012-11-12 ENCOUNTER — Ambulatory Visit (INDEPENDENT_AMBULATORY_CARE_PROVIDER_SITE_OTHER): Payer: BC Managed Care – PPO | Admitting: Adult Health

## 2012-11-12 ENCOUNTER — Telehealth: Payer: Self-pay | Admitting: Internal Medicine

## 2012-11-12 ENCOUNTER — Encounter: Payer: Self-pay | Admitting: Adult Health

## 2012-11-12 VITALS — BP 147/89 | HR 85 | Temp 98.5°F | Resp 14 | Ht 72.0 in | Wt 278.0 lb

## 2012-11-12 DIAGNOSIS — J069 Acute upper respiratory infection, unspecified: Secondary | ICD-10-CM

## 2012-11-12 MED ORDER — AMOXICILLIN-POT CLAVULANATE 875-125 MG PO TABS: 1.0000 | ORAL_TABLET | Freq: Two times a day (BID) | ORAL | Status: DC

## 2012-11-12 MED ORDER — FLUTICASONE PROPIONATE 50 MCG/ACT NA SUSP: 2.0000 | Freq: Every day | NASAL | Status: DC

## 2012-11-12 NOTE — Telephone Encounter (Signed)
Patient Information:  Caller Name: [place]  Phone: [phone]  Patient: Mark Welch, Mark Welch  Gender: Male  DOB: 05-14-1976  Age: 37 Years  PCP: Duncan Dull (Adults only)  Office Follow Up:  Does the office need to follow up with this patient?: No  Instructions For The Office: N/A   Symptoms  Reason For Call & Symptoms: Seen in office last week on 11/05/12 for Physical and dx with virus. Advised to take Muscinex and use Saline nasal spray and sx worsening.  Sinus Headaches and has Sore Throat since 11/08/12. Last night he woke up frequently, he was very congested and throat sore and dry. Eyes puffy and feeling pressure. Greenish yellow nasal drainage.  Reviewed Health History In EMR: Yes  Reviewed Medications In EMR: Yes  Reviewed Allergies In EMR: Yes  Reviewed Surgeries / Procedures: Yes  Date of Onset of Symptoms: 12/03/2012  Treatments Tried: Saline nasal spray, Musinex, Tylenol Cold and Sinus 1 tab PO q 6 hr prn- helps some  Treatments Tried Worked: Not Really  Guideline(s) Used:  Headache  Sinus Pain and Congestion  Disposition Per Guideline:   Go to Office Now  Reason For Disposition Reached:   Redness or swelling on the cheek, forehead, or around the eye  Advice Given:  Reassurance:   Sinus congestion is a normal part of a cold.  Usually home treatment with nasal washes can prevent an actual bacterial sinus infection.  Nasal Decongestants for a Very Stuffy or Runny Nose:  If you have a very stuffy nose, nasal decongestant medicines can shrink the swollen nasal mucosa and allow for easier breathing. If you have a very runny nose, these medicines can reduce the amount of drainage. They may be taken as pills by mouth or as a nasal spray.  Most people do Not need to use these medicines. If your nose feels blocked, you should try using nasal washes first.  Pain and Fever Medicines:  For pain or fever relief, take either acetaminophen or ibuprofen.  They are  over-the-counter (OTC) drugs that help treat both fever and pain. You can buy them at the drugstore.  Pain and Fever Medicines:  For pain or fever relief, take either acetaminophen or ibuprofen.  They are over-the-counter (OTC) drugs that help treat both fever and pain. You can buy them at the drugstore.  Acetaminophen (e.g., Tylenol):  Regular Strength Tylenol: Take 650 mg (two 325 mg pills) by mouth every 4-6 hours as needed. Each Regular Strength Tylenol pill has 325 mg of acetaminophen.  Extra Strength Tylenol: Take 1,000 mg (two 500 mg pills) every 8 hours as needed. Each Extra Strength Tylenol pill has 500 mg of acetaminophen.  The most you should take each day is 3,000 mg (10 Regular Strength or 6 Extra Strength pills a day).  Hydration:  Drink plenty of liquids (6-8 glasses of water daily). If the air in your home is dry, use a cool mist humidifier  Expected Course:  Sinus congestion from viral upper respiratory infections (colds) usually lasts 5-10 days.  Occasionally a cold can worsen and turn into bacterial sinusitis. Clues to this are sinus symptoms lasting longer than 10 days, fever lasting longer than 3 days, and worsening pain. Bacterial sinusitis may need antibiotic treatment.  Call Back If:   Severe pain lasts longer than 2 hours after pain medicine  Sinus pain lasts longer than 1 day after starting treatment using nasal washes  Sinus congestion (fullness) lasts longer than 10 days  Fever lasts longer  than 3 days  You become worse.  Appointment Scheduled:  11/12/2012 14:00:00 Appointment Scheduled Provider:  Orville Govern

## 2012-11-12 NOTE — Progress Notes (Signed)
  Subjective:    Patient ID: Mark Welch, male    DOB: Jan 13, 1976, 37 y.o.   MRN: [id number]  HPI  Patient is a pleasant 37 y/o male who presents to clinic today with c/o sinus congestion, sore throat, post nasal drip, headache and mild cough. Denies shortness of breath or wheezing. Symptoms started over the weekend. Patient has tried mucinex. He has also been using saline spray. He does not feel that the OTC medication is helping him.   Current Outpatient Prescriptions on File Prior to Visit  Medication Sig Dispense Refill  . amitriptyline (ELAVIL) 25 MG tablet Take 1 tablet (25 mg total) by mouth at bedtime.  90 tablet  3  . meloxicam (MOBIC) 15 MG tablet Take 1 tablet (15 mg total) by mouth daily.  90 tablet  1  . omeprazole (PRILOSEC) 40 MG capsule Take 1 capsule (40 mg total) by mouth daily.  90 capsule  3  . pravastatin (PRAVACHOL) 20 MG tablet Take 1 tablet (20 mg total) by mouth daily.  90 tablet  3  . zolpidem (AMBIEN) 10 MG tablet Take 1 tablet (10 mg total) by mouth at bedtime as needed.  30 tablet  2  . aspirin 81 MG EC tablet Take 1 tablet (81 mg total) by mouth daily. Swallow whole.  30 tablet  12  . cetirizine (ZYRTEC) 10 MG tablet Take 10 mg by mouth daily.        Marland Kitchen losartan (COZAAR) 50 MG tablet Take 1 tablet (50 mg total) by mouth daily.  90 tablet  3   No current facility-administered medications on file prior to visit.     Review of Systems  Constitutional: Negative for fever and chills.  HENT: Positive for congestion, sore throat, postnasal drip and sinus pressure.   Respiratory: Positive for cough. Negative for chest tightness, shortness of breath and wheezing.   Cardiovascular: Negative for chest pain.  Gastrointestinal: Negative for nausea, vomiting and diarrhea.  Neurological: Positive for headaches. Negative for dizziness and light-headedness.  Psychiatric/Behavioral: Negative.     BP 147/89  Pulse 85  Temp(Src) 98.5 F (36.9 C) (Oral)  Ht 6' (1.829  m)  Wt 278 lb (126.1 kg)  BMI 37.7 kg/m2  SpO2 97%     Objective:   Physical Exam  Constitutional: He is oriented to person, place, and time. No distress.  Overweight.  HENT:  Head: Normocephalic and atraumatic.  Right Ear: External ear normal.  Left Ear: External ear normal.  Pharyngeal erythema without exudate.  Cardiovascular: Normal rate, regular rhythm, normal heart sounds and intact distal pulses.  Exam reveals no gallop.   No murmur heard. Pulmonary/Chest: Effort normal and breath sounds normal. No respiratory distress. He has no wheezes. He has no rales.  Abdominal: Soft. Bowel sounds are normal.  Lymphadenopathy:    He has no cervical adenopathy.  Neurological: He is alert and oriented to person, place, and time.  Skin: Skin is warm and dry.  Psychiatric: He has a normal mood and affect. His behavior is normal. Judgment and thought content normal.          Assessment & Plan:

## 2012-11-12 NOTE — Telephone Encounter (Signed)
FYI Dr Tullo 

## 2012-11-12 NOTE — Assessment & Plan Note (Signed)
Suspect sinus infection. Will treat empirically with Augmentin x 7 days. Also, start flonase nasal spray 2 sprays in each nostril daily. RTC if no resolution of symptoms by Monday.

## 2012-11-12 NOTE — Patient Instructions (Addendum)
Please start your antibiotic today. You will take this twice a day for 7 days.  Also start your flonase nasal spray. You will do 2 sprays in each nostril once a day.  Please let us know if you are not improved by Monday.  What is sinusitis? - Sinusitis is a condition that can cause a stuffy nose, pain in the face, and yellow or green discharge (mucus) from the nose. The sinuses are hollow areas in the bones of the face. They have a thin lining that normally makes a small amount of mucus. When this lining gets infected, it swells and makes extra mucus. This causes symptoms.   Sinusitis can occur when a person gets sick with a cold. The germs causing the cold can also infect the sinuses. Many times, a person feels like his or her cold is getting better. But then he or she gets sinusitis and begins to feel sick again.  What are the symptoms of sinusitis? - Common symptoms of sinusitis include: Stuffy or blocked nose  Thick yellow or green discharge from the nose  Pain in the teeth  Pain or pressure in the face - This often feels worse when a person bends forward.   People with sinusitis can also have other symptoms that include: Fever  Cough  Trouble smelling  Ear pressure or fullness  Headache  Bad breath  Feeling tired   Most of the time, symptoms start to improve in 7 to 10 days.  Should I see a doctor or nurse? - See your doctor or nurse if your symptoms last more than 7 days, or if your symptoms get better at first but then get worse. Sometimes, sinusitis can lead to serious problems. See your doctor or nurse right away (do not wait 7 days) if you have: Fever higher than 102.26F (39.2C)  Sudden and severe pain in the face and head  Trouble seeing or seeing double  Trouble thinking clearly  Swelling or redness around 1 or both eyes  Trouble breathing or a stiff neck   Is there anything I can do on my own to feel better? - Yes. To reduce your symptoms, you can: Take an  over-the-counter pain reliever to reduce the pain  Rinse your nose and sinuses with salt water a few times a day - Ask your doctor or nurse about the best way to do this.  Use a decongestant nose spray - These sprays are sold in a pharmacy. But do not use decongestant nose sprays for more than 2 to 3 days in a row. Using them more than 3 days in a row can make symptoms worse.   You should NOT take an antihistamine for sinusitis. Common antihistamines include diphenhydramine (sample brand name: Benadryl), chlorpheniramine (sample brand name: Chlor-Trimeton), loratadine (sample brand name: Claritin), and cetirizine (sample brand name: Zyrtec). They can treat allergies, but not sinus infections, and could increase your discomfort by drying the lining of your nose and sinuses, or making you tired.   Your doctor might also prescribe a steroid nose spray to reduce the swelling in your nose. (Steroid nose sprays do not contain the same steroids that athletes take to build muscle.)  How is sinusitis treated? - Most of the time, sinusitis does not need to be treated with antibiotic medicines. This is because most sinusitis is caused by viruses - not bacteria - and antibiotics do not kill viruses. Many people get over sinus infections without antibiotics.  Some people with sinusitis do  need treatment with antibiotics. If your symptoms have not improved after 7 to 10 days, ask your doctor if you should take antibiotics. Your doctor might recommend that you wait 1 more week to see if your symptoms improve. But if you have symptoms such as a fever or a lot of pain, he or she might prescribe antibiotics. It is important to follow your doctor's instructions about taking your antibiotics.

## 2013-03-17 ENCOUNTER — Encounter: Payer: Self-pay | Admitting: Adult Health

## 2013-03-17 ENCOUNTER — Ambulatory Visit (INDEPENDENT_AMBULATORY_CARE_PROVIDER_SITE_OTHER): Payer: BC Managed Care – PPO | Admitting: Adult Health

## 2013-03-17 ENCOUNTER — Other Ambulatory Visit: Payer: Self-pay | Admitting: Internal Medicine

## 2013-03-17 VITALS — BP 138/92 | HR 100 | Temp 98.0°F | Resp 12 | Ht 71.5 in | Wt 280.0 lb

## 2013-03-17 DIAGNOSIS — E669 Obesity, unspecified: Secondary | ICD-10-CM

## 2013-03-17 DIAGNOSIS — F419 Anxiety disorder, unspecified: Secondary | ICD-10-CM | POA: Insufficient documentation

## 2013-03-17 DIAGNOSIS — I10 Essential (primary) hypertension: Secondary | ICD-10-CM

## 2013-03-17 DIAGNOSIS — E785 Hyperlipidemia, unspecified: Secondary | ICD-10-CM

## 2013-03-17 DIAGNOSIS — E119 Type 2 diabetes mellitus without complications: Secondary | ICD-10-CM

## 2013-03-17 DIAGNOSIS — Z0000 Encounter for general adult medical examination without abnormal findings: Secondary | ICD-10-CM

## 2013-03-17 DIAGNOSIS — G479 Sleep disorder, unspecified: Secondary | ICD-10-CM

## 2013-03-17 MED ORDER — OMEPRAZOLE 40 MG PO CPDR: 40.0000 mg | DELAYED_RELEASE_CAPSULE | Freq: Every day | ORAL | Status: DC

## 2013-03-17 MED ORDER — TRAZODONE 25 MG HALF TABLET: ORAL_TABLET | ORAL | Status: DC

## 2013-03-17 MED ORDER — PRAVASTATIN SODIUM 20 MG PO TABS: 20.0000 mg | ORAL_TABLET | Freq: Every day | ORAL | Status: DC

## 2013-03-17 MED ORDER — AMITRIPTYLINE HCL 25 MG PO TABS: 25.0000 mg | ORAL_TABLET | Freq: Every day | ORAL | Status: DC

## 2013-03-17 MED ORDER — LOSARTAN POTASSIUM 50 MG PO TABS: 50.0000 mg | ORAL_TABLET | Freq: Every day | ORAL | Status: DC

## 2013-03-17 NOTE — Assessment & Plan Note (Addendum)
Blood pressure slightly elevated today during the visit. Suspect element of white coat syndrome. Continue losartan. Check CBC and a metabolic panel

## 2013-03-17 NOTE — Assessment & Plan Note (Signed)
Difficulty with weight. He recently changed jobs and feels that he has more time to focus on eating properly. His previous job was extremely busy with minimal time for breaks which meant that he would go for hours without anything to eat. This resulted in overeating at the end of the day when he would get home. Discussed following Dr. Melina Schools diet which is carbohydrate control. I think he would do very well on this diet. Patient has copy of the diet. If he manages to drop some weight this would help not only his diabetes but also his cholesterol and blood pressure.

## 2013-03-17 NOTE — Telephone Encounter (Signed)
Pt states no longer taking Meloxicam.

## 2013-03-17 NOTE — Assessment & Plan Note (Signed)
Patient is on pravastatin and tolerating well. Check lipid panel.

## 2013-03-17 NOTE — Assessment & Plan Note (Signed)
Patient has been on Ambien 10 mg at bedtime as needed. He reports inability to sleep if he does not have Ambien. Insurance only provides 15 tablets per month. He would like something that he could take nightly. We will try trazodone 25 mg at bedtime as needed.

## 2013-03-17 NOTE — Assessment & Plan Note (Signed)
Patient's diabetes has been controlled with diet. Check hemoglobin A1c, microalbumin.

## 2013-03-17 NOTE — Progress Notes (Signed)
Subjective:    Patient ID: Mark Welch, male    DOB: 05/20/1976, 37 y.o.   MRN: [id number]  HPI  Patient is a pleasant 37 year old male with history of diabetes mellitus type 2 diet controlled, hypertension, hyperlipidemia, obesity who presents to clinic for his yearly physical. Patient recently started a new job and reports experiencing mild anxiety and sleep disturbance. He has been taking Ambien but needs something that he could take on a daily basis. Patient reports that since starting this new job he has been able to take adequate breaks and eat better. Patient reports that previously he would go for hours at a time without being able to stop for even a snack. Then he would get home starving and overeat. Again, since starting this new job he has been eating more regularly and feels that he has been able to focus on more healthier choices.  Past Medical History  Diagnosis Date  . Obesity (BMI 30-39.9)   . Hypertension   . Hyperlipidemia   . Kidney stones   . GERD (gastroesophageal reflux disease)     Health Maintenance:  Tdap - 07/2010  Flu shot - due 06/2013  Labs - Needs Hgb A1C, lipid panel, metabolic panel  Depression Screen - No feelings of sadness, hopelessness or anhedonia  Tobacco Use - Never  Alcohol Use - None  Dental Exams - Every 6 months  Vision Exam - Yearly  Exercise - Not currently  Diet - Discussed Dr. Melina Schools diet plan. He has a copy of her diet   Review of Systems  Constitutional: Negative.   HENT: Negative.        Dry throat. Pt was taking ambien.  Respiratory: Negative.   Cardiovascular: Negative.   Gastrointestinal: Negative.   Endocrine: Negative.   Genitourinary: Negative.   Musculoskeletal:       Left foot plantar fasciitis - followed by podiatry  Allergic/Immunologic:       Seasonal allergies. Allergic to maple trees. Takes year round antihistamines.  Neurological: Negative.   Hematological: Negative.   Psychiatric/Behavioral:  Positive for sleep disturbance. Negative for suicidal ideas, behavioral problems, confusion, self-injury and agitation. The patient is nervous/anxious.        Anxiety with new job and with travel. Difficulty sleeping.    BP 138/92  Pulse 100  Temp(Src) 98 F (36.7 C) (Oral)  Resp 12  Ht 5' 11.5" (1.816 m)  Wt 280 lb (127.007 kg)  BMI 38.51 kg/m2  SpO2 97%    Objective:   Physical Exam  Constitutional:  Overweight, pleasant male in no apparent distress  HENT:  Head: Normocephalic and atraumatic.  Right Ear: External ear normal.  Left Ear: External ear normal.  Nose: Nose normal.  Mouth/Throat: Oropharynx is clear and moist.  Eyes: Conjunctivae and EOM are normal. Pupils are equal, round, and reactive to light.  Neck: Normal range of motion. Neck supple. No tracheal deviation present. No thyromegaly present.  Cardiovascular: Normal rate, regular rhythm, normal heart sounds and intact distal pulses.  Exam reveals no gallop.   No murmur heard. Pulmonary/Chest: Effort normal and breath sounds normal. No respiratory distress. He has no wheezes. He has no rales.  Abdominal: Soft. Bowel sounds are normal. He exhibits no distension and no mass. There is no tenderness. There is no rebound and no guarding. Hernia confirmed negative in the right inguinal area and confirmed negative in the left inguinal area.  Genitourinary: Testes normal and penis normal. Right testis shows no mass, no swelling and no  tenderness. Right testis is descended. Left testis shows no mass, no swelling and no tenderness. Left testis is descended. No hypospadias, penile erythema or penile tenderness. No discharge found.  Lymphadenopathy:    He has no cervical adenopathy.       Right: No inguinal adenopathy present.       Left: No inguinal adenopathy present.       Assessment & Plan:

## 2013-03-17 NOTE — Patient Instructions (Addendum)
  Please return for your fasting labs. You may have water or black coffee prior.  We will notify you of the results once they are available.  I have sent in refills for your prescriptions to your pharmacy.  Please call with any questions or concerns.

## 2013-03-18 ENCOUNTER — Telehealth: Payer: Self-pay | Admitting: *Deleted

## 2013-03-18 NOTE — Telephone Encounter (Signed)
Ok to change to 50mg  1/2 tab nightly?

## 2013-03-18 NOTE — Telephone Encounter (Signed)
Message below came from:  [employer] number: [phone]  Fax number: [phone]

## 2013-03-18 NOTE — Telephone Encounter (Signed)
Pharmacy Note:  We cannot fet 25 mg strength please prescribe 50 mg, 100 mg or 150 mg strength for this patient   Trazodone 25 mg tab

## 2013-03-19 NOTE — Telephone Encounter (Signed)
Trazodone 50 mg tablets take 1/2 tab at bedtime as needed for sleep.

## 2013-03-22 NOTE — Telephone Encounter (Signed)
[employer] [facility] called, notified of new Rx.

## 2013-03-22 NOTE — Addendum Note (Signed)
Addended by: Chandra Batch E on: 03/22/2013 09:19 AM   Modules accepted: Orders

## 2013-06-18 ENCOUNTER — Ambulatory Visit (INDEPENDENT_AMBULATORY_CARE_PROVIDER_SITE_OTHER): Payer: Managed Care, Other (non HMO) | Admitting: Internal Medicine

## 2013-06-18 ENCOUNTER — Encounter: Payer: Self-pay | Admitting: Internal Medicine

## 2013-06-18 VITALS — BP 140/94 | HR 83 | Temp 98.1°F | Resp 14 | Wt 281.8 lb

## 2013-06-18 DIAGNOSIS — E669 Obesity, unspecified: Secondary | ICD-10-CM

## 2013-06-18 DIAGNOSIS — Z0000 Encounter for general adult medical examination without abnormal findings: Secondary | ICD-10-CM

## 2013-06-18 DIAGNOSIS — E119 Type 2 diabetes mellitus without complications: Secondary | ICD-10-CM

## 2013-06-18 DIAGNOSIS — G479 Sleep disorder, unspecified: Secondary | ICD-10-CM

## 2013-06-18 DIAGNOSIS — E785 Hyperlipidemia, unspecified: Secondary | ICD-10-CM

## 2013-06-18 DIAGNOSIS — Z23 Encounter for immunization: Secondary | ICD-10-CM

## 2013-06-18 DIAGNOSIS — I10 Essential (primary) hypertension: Secondary | ICD-10-CM

## 2013-06-18 LAB — BASIC METABOLIC PANEL
BUN: 13 mg/dL (ref 6–23)
CO2: 27 mEq/L (ref 19–32)
Calcium: 9.2 mg/dL (ref 8.4–10.5)
Chloride: 104 mEq/L (ref 96–112)
Creatinine, Ser: 0.9 mg/dL (ref 0.4–1.5)
GFR: 106.38 mL/min (ref >60.00)
Glucose, Bld: 105 mg/dL — ABNORMAL HIGH (ref 70–99)
Potassium: 4.2 mEq/L (ref 3.5–5.1)
Sodium: 139 mEq/L (ref 135–145)

## 2013-06-18 LAB — CBC WITH DIFFERENTIAL/PLATELET
Basophils Absolute: 0 10*3/uL (ref 0.0–0.1)
Eosinophils Absolute: 0 10*3/uL (ref 0.0–0.7)
Hemoglobin: 14.1 g/dL (ref 13.0–17.0)
Lymphocytes Relative: 22.2 % (ref 12.0–46.0)
MCHC: 34.1 g/dL (ref 30.0–36.0)
Monocytes Relative: 8.4 % (ref 3.0–12.0)
Neutrophils Relative %: 69.1 % (ref 43.0–77.0)
Platelets: 198 10*3/uL (ref 150.0–400.0)
RDW: 13.4 % (ref 11.5–14.6)

## 2013-06-18 LAB — HM DIABETES FOOT EXAM: HM Diabetic Foot Exam: NORMAL

## 2013-06-18 LAB — LIPID PANEL
Cholesterol: 230 mg/dL — ABNORMAL HIGH (ref 0–200)
HDL: 34.3 mg/dL — ABNORMAL LOW (ref >39.00)
Total CHOL/HDL Ratio: 7

## 2013-06-18 LAB — HEMOGLOBIN A1C: Hgb A1c MFr Bld: 6.6 % — ABNORMAL HIGH (ref 4.6–6.5)

## 2013-06-18 MED ORDER — LOSARTAN POTASSIUM 50 MG PO TABS: 100.0000 mg | ORAL_TABLET | Freq: Every day | ORAL | Status: DC

## 2013-06-18 MED ORDER — ESZOPICLONE 2 MG PO TABS: 2.0000 mg | ORAL_TABLET | Freq: Every day | ORAL | Status: DC

## 2013-06-18 NOTE — Patient Instructions (Signed)
I have increased your losartan to 100 mg daily  I have changed your sleep medicaiton to Lunesta  ,  You may increase the dose to 1.5 tablet in a week of it is not helping.   You need to lose weight .    Your first goal is to get your BMI < 30.  This means getting down to 210 lbs.   This is  One version of a  "Low GI"  Diet:  It will still lower your blood sugars and allow you to lose 4 to 8  lbs  per month if you follow it carefully.  Your goal with exercise is a minimum of 30 minutes of aerobic exercise 5 days per week (Walking does not count once it becomes easy!)    All of the foods can be found at grocery stores and in bulk at [employer].  The Atkins protein bars and shakes are available in more varieties at Target, WalMart and [employer]'s Foods.     7 AM Breakfast:  Choose from the following:  Low carbohydrate Protein  Shakes (I recommend the [employer]AS [employer]vantEdge "Carb Control" shakes  Or the low carb shakes by Atkins.    2.5 carbs   Arnold's "Sandwhich Thin"toasted  w/ peanut butter (no jelly: about 20 net carbs  "Bagel Thin" with cream cheese and salmon: about 20 carbs   a scrambled egg/bacon/cheese burrito made with Mission's "carb balance" whole wheat tortilla  (about 10 net carbs )   Avoid cereal and bananas, oatmeal and cream of wheat and grits. They are loaded with carbohydrates!   10 AM: high protein snack  Protein bar by Atkins (the snack size, under 200 cal, usually < 6 net carbs).    A stick of cheese:  Around 1 carb,  100 cal     Dannon Light n Fit [place] Yogurt  (80 cal, 8 carbs)  Other so called "protein bars" and Greek yogurts tend to be loaded with carbohydrates.  Remember, in food advertising, the word "energy" is synonymous for " carbohydrate."  Lunch:   A Sandwich using the bread choices listed, Can use any  Eggs,  lunchmeat, grilled meat or canned tuna), avocado, regular mayo/mustard  and cheese.  A Salad using blue cheese, ranch,  Goddess or vinagrette,  No croutons or  "confetti" and no "candied nuts" but regular nuts OK.   No pretzels or chips.  Pickles and miniature sweet peppers are a good low carb alternative that provide a "crunch"  The bread is the only source of carbohydrate in a sandwich and  can be decreased by trying some of these alternatives to traditional loaf bread  Joseph's makes a pita bread and a flat bread that are 50 cal and 4 net carbs available at BJs and WalMart.  This can be toasted to use with hummous as well  Toufayan makes a low carb flatbread that's 100 cal and 9 net carbs available at [employer] and [employer] makes 2 sizes of  Low carb whole wheat tortilla  (The large one is 210 cal and 6 net carbs) Avoid "Low fat dressings, as well as Reyne Dumas and [address] dressings They are loaded with sugar!   3 PM/ Mid day  Snack:  Consider  1 ounce of  almonds, walnuts, pistachios, pecans, peanuts,  Macadamia nuts or a nut medley.  Avoid "granola"; the dried cranberries and raisins are loaded with carbohydrates. Mixed nuts as long as there are no raisins,  cranberries  or dried fruit.     6 PM  Dinner:     Meat/fowl/fish with a green salad, and either broccoli, cauliflower, green beans, spinach, brussel sprouts or  Lima beans. DO NOT BREAD THE PROTEIN!!      There is a low carb pasta by Dreamfield's that is acceptable and tastes great: only 5 digestible carbs/serving.( All grocery stores but BJs carry it )  Try Kai Levins Angelo's chicken piccata or chicken or eggplant parm over low carb pasta.(Lowes and BJs)   Clifton Custard Sanchez's "Carnitas" (pulled pork, no sauce,  0 carbs) or his beef pot roast to make a dinner burrito (at BJ's)  Pesto over low carb pasta (bj's sells a good quality pesto in the center refrigerated section of the deli   Whole wheat pasta is still full of digestible carbs and  Not as low in glycemic index as Dreamfield's.   Brown rice is still rice,  So skip the rice and noodles if you eat [place] or [place] (or at least limit  to 1/2 cup)  9 PM snack :   Breyer's "low carb" fudgsicle or  ice cream bar (Carb Smart line), or  Weight Watcher's ice cream bar , or another "no sugar added" ice cream;  a serving of fresh berries/cherries with whipped cream   Cheese or DANNON'S LlGHT N FIT [place] YOGURT  Avoid bananas, pineapple, grapes  and watermelon on a regular basis because they are high in sugar.  THINK OF THEM AS DESSERT  Remember that snack Substitutions should be less than 10 NET carbs per serving and meals < 20 carbs. Remember to subtract fiber grams to get the "net carbs."

## 2013-06-18 NOTE — Progress Notes (Signed)
Patient ID: Mark Welch, male   DOB: [birth year], 37 y.o.   MRN: [id number]    Patient Active Problem List   Diagnosis Date Noted  . Sleep disturbance 03/17/2013  . Bursitis of knee 09/18/2012  . Diabetes mellitus type 2, diet-controlled 08/02/2012  . Obesity (BMI 30-39.9)   . Hypertension   . Dyslipidemia (high LDL; low HDL)     Subjective:  CC:   Chief Complaint  Patient presents with  . Follow-up    refill medications    HPI:   [name] a 37 y.o. male who presents for follow up on diet controlled diabetes, hypertension, and obesity. He was last seen 8 months ago. He had an insurance lapse which complicated his ability to afford regular care. He notes that his blood pressure has been elevated lately. He has been checking it at home and at the pharmacy and has noted that his systolics have been between 130 and 140/90.  He does not check his blood sugars regularly. He has not been exercising lately due to persistent foot pain.  He was treated for plantar fasciitis at the [facility].  The  use of the nighttime boots and the injections have helped  improve his pain level but her symptoms are not completely resolved.Marland Kitchen  Has not been back in a few months bc of insurance lapse   he was treated recently by a nurse practitioner for insomnia. She changed his medication from [place] to trazdone  which has not been effective. He is interested in trying [place].    Past Medical History  Diagnosis Date  . Obesity (BMI 30-39.9)   . Hypertension   . Hyperlipidemia   . Kidney stones   . GERD (gastroesophageal reflux disease)     No past surgical history on file.     The following portions of the patient's history were reviewed and updated as appropriate: Allergies, current medications, and problem list.    Review of Systems:   12 Pt  review of systems was negative except those addressed in the HPI,     History   Social History  . Marital Status: Single     Spouse Name: N/A    Number of Children: N/A  . Years of Education: N/A   Occupational History  . Not on file.   Social History Main Topics  . Smoking status: Never Smoker   . Smokeless tobacco: Never Used  . Alcohol Use: No  . Drug Use: No  . Sexual Activity: Not on file   Other Topics Concern  . Not on file   Social History Narrative  . No narrative on file    Objective:  Filed Vitals:   06/18/13 1035  BP: 140/94  Pulse: 83  Temp: 98.1 F (36.7 C)  Resp: 14     General appearance: alert, cooperative and appears stated age Ears: normal TM's and external ear canals both ears Throat: lips, mucosa, and tongue normal; teeth and gums normal Neck: no adenopathy, no carotid bruit, supple, symmetrical, trachea midline and thyroid not enlarged, symmetric, no tenderness/mass/nodules Back: symmetric, no curvature. ROM normal. No CVA tenderness. Lungs: clear to auscultation bilaterally Heart: regular rate and rhythm, S1, S2 normal, no murmur, click, rub or gallop Abdomen: soft, non-tender; bowel sounds normal; no masses,  no organomegaly Pulses: 2+ and symmetric Skin: Skin color, texture, turgor normal. No rashes or lesions Lymph nodes: Cervical, supraclavicular, and axillary nodes normal. Foot exam:  Nails are well trimmed,  No callouses,  Sensation intact to microfilament  Assessment and Plan:  Obesity (BMI 30-39.9) Body mass index is 38.75 kg/(m^2). I have addressed  BMI and recommended wt loss of 10% of body weigh over the next 6 months using a low glycemic index diet and resuming regular exercise a minimum of 5 days per week.    Diabetes mellitus type 2, diet-controlled Well-controlled on diet alone .  hemoglobin A1c has been consistently less than 7.0 . Patient is up-to-date on eye exams and foot exam was done today.  There is  no proteinuria on today's micro urinalysis .  Fasting lipids have been reviewed and statin therapy has been advised and updated according to  new ACC guidelines based on patient's 10 year risk of CAD   Sleep disturbance Recent change from Ambien to trazodone was not successful in treating insomnia. Trial of Lunesta. We'll start with 2 mg daily.  Dyslipidemia (high LDL; low HDL) His LDL and triglycerides are not at goal on current pravastatin. New ACC guidelines recommend starting patients aged 84 or higher on moderate intensity statin therapy for diabetes and concurrent LDL between 70-189. Given his 10 year risk of coronary artery disease of 18%., I have recommended changing him to moderate intensity statin with simvastatin.   Updated Medication List Outpatient Encounter Prescriptions as of 06/18/2013  Medication Sig Dispense Refill  . amitriptyline (ELAVIL) 25 MG tablet Take 1 tablet (25 mg total) by mouth at bedtime.  90 tablet  3  . fexofenadine (ALLEGRA) 180 MG tablet Take 180 mg by mouth daily.      Marland Kitchen losartan (COZAAR) 50 MG tablet Take 2 tablets (100 mg total) by mouth daily.  90 tablet  3  . omeprazole (PRILOSEC) 40 MG capsule Take 1 capsule (40 mg total) by mouth daily.  90 capsule  3  . [DISCONTINUED] losartan (COZAAR) 50 MG tablet Take 1 tablet (50 mg total) by mouth daily.  90 tablet  3  . [DISCONTINUED] pravastatin (PRAVACHOL) 20 MG tablet Take 1 tablet (20 mg total) by mouth daily.  90 tablet  3  . eszopiclone (LUNESTA) 2 MG TABS tablet Take 1 tablet (2 mg total) by mouth at bedtime. Take immediately before bedtime  30 tablet  1  . simvastatin (ZOCOR) 40 MG tablet Take 1 tablet (40 mg total) by mouth at bedtime.  90 tablet  3  . [DISCONTINUED] traZODone (DESYREL) 50 MG tablet Take 25 mg by mouth at bedtime.       No facility-administered encounter medications on file as of 06/18/2013.     Orders Placed This Encounter  Procedures  . Flu Vaccine QUAD 36+ mos PF IM (Fluarix)  . LDL cholesterol, direct  . HM DIABETES EYE EXAM  . HM DIABETES FOOT EXAM    No Follow-up on file.

## 2013-06-20 ENCOUNTER — Telehealth: Payer: Self-pay | Admitting: Internal Medicine

## 2013-06-20 MED ORDER — SIMVASTATIN 40 MG PO TABS: 40.0000 mg | ORAL_TABLET | Freq: Every day | ORAL | Status: DC

## 2013-06-20 NOTE — Assessment & Plan Note (Addendum)
Well-controlled on diet alone .  hemoglobin A1c has been consistently less than 7.0 . Patient is up-to-date on eye exams and foot exam was done today.  There is  no proteinuria on today's micro urinalysis .  Fasting lipids have been reviewed and statin therapy has been advised and updated according to new ACC guidelines based on patient's 10 year risk of CAD  

## 2013-06-20 NOTE — Assessment & Plan Note (Signed)
Body mass index is 38.75 kg/(m^2). I have addressed  BMI and recommended wt loss of 10% of body weigh over the next 6 months using a low glycemic index diet and resuming regular exercise a minimum of 5 days per week.

## 2013-06-20 NOTE — Telephone Encounter (Signed)
Your diabetes remains  well-controlled on diet alone .  hemoglobin A1c has been consistently less than 7.0 .  There is  no proteinuria on today's micro urinalysis Fasting lipids have been reviewed and his current statin therapy is not sufficient. According to the new ACC guidelines based on your 10 year risk of Coronary artery disease you need to be on a stronger cholesterol medication.  I have sent an Rx for generic Zocor to [facility].   You may finish your current pravastatin prescription but then switch to the generic Zocor (simvastatin)   Return in 3 months,  get fasting labs prior if feasible.

## 2013-06-20 NOTE — Assessment & Plan Note (Signed)
Recent change from Ambien to trazodone was not successful in treating insomnia. Trial of Lunesta. We'll start with 2 mg daily.

## 2013-06-20 NOTE — Assessment & Plan Note (Addendum)
His LDL and triglycerides are not at goal on current pravastatin. New ACC guidelines recommend starting patients aged 39 or higher on moderate intensity statin therapy for diabetes and concurrent LDL between 70-189. Given his 10 year risk of coronary artery disease of 18%., I have recommended changing him to moderate intensity statin with simvastatin.

## 2013-06-21 NOTE — Telephone Encounter (Signed)
Patient notified as requested and appointment made for January .

## 2013-06-24 ENCOUNTER — Encounter: Payer: Self-pay | Admitting: *Deleted

## 2013-09-20 ENCOUNTER — Telehealth: Payer: Self-pay | Admitting: *Deleted

## 2013-09-20 NOTE — Telephone Encounter (Signed)
Pt is coming in tomorrow what labs and dx?  

## 2013-09-21 ENCOUNTER — Other Ambulatory Visit (INDEPENDENT_AMBULATORY_CARE_PROVIDER_SITE_OTHER): Payer: Managed Care, Other (non HMO)

## 2013-09-21 ENCOUNTER — Telehealth: Payer: Self-pay | Admitting: *Deleted

## 2013-09-21 DIAGNOSIS — E785 Hyperlipidemia, unspecified: Secondary | ICD-10-CM

## 2013-09-21 DIAGNOSIS — E119 Type 2 diabetes mellitus without complications: Secondary | ICD-10-CM

## 2013-09-21 LAB — COMPREHENSIVE METABOLIC PANEL
ALT: 39 U/L (ref 0–53)
AST: 32 U/L (ref 0–37)
Albumin: 3.7 g/dL (ref 3.5–5.2)
Alkaline Phosphatase: 49 U/L (ref 39–117)
BUN: 15 mg/dL (ref 6–23)
CO2: 26 mEq/L (ref 19–32)
CREATININE: 1 mg/dL (ref 0.4–1.5)
Calcium: 9.2 mg/dL (ref 8.4–10.5)
Chloride: 106 mEq/L (ref 96–112)
GFR: 90.15 mL/min (ref >60.00)
Glucose, Bld: 125 mg/dL — ABNORMAL HIGH (ref 70–99)
Potassium: 3.9 mEq/L (ref 3.5–5.1)
Sodium: 139 mEq/L (ref 135–145)
Total Bilirubin: 0.9 mg/dL (ref 0.3–1.2)
Total Protein: 7.3 g/dL (ref 6.0–8.3)

## 2013-09-21 LAB — LIPID PANEL
Cholesterol: 207 mg/dL — ABNORMAL HIGH (ref 0–200)
HDL: 33.4 mg/dL — ABNORMAL LOW (ref >39.00)
Total CHOL/HDL Ratio: 6
Triglycerides: 203 mg/dL — ABNORMAL HIGH (ref 0.0–149.0)
VLDL: 40.6 mg/dL — ABNORMAL HIGH (ref 0.0–40.0)

## 2013-09-21 LAB — HEMOGLOBIN A1C: HEMOGLOBIN A1C: 6.7 % — AB (ref 4.6–6.5)

## 2013-09-21 LAB — LDL CHOLESTEROL, DIRECT: Direct LDL: 147.5 mg/dL

## 2013-09-21 NOTE — Telephone Encounter (Signed)
Pt is coming in tomorrow what labs and dx?  

## 2013-09-23 ENCOUNTER — Encounter: Payer: Self-pay | Admitting: *Deleted

## 2013-09-24 ENCOUNTER — Ambulatory Visit (INDEPENDENT_AMBULATORY_CARE_PROVIDER_SITE_OTHER): Payer: Managed Care, Other (non HMO) | Admitting: Internal Medicine

## 2013-09-24 ENCOUNTER — Encounter: Payer: Self-pay | Admitting: Internal Medicine

## 2013-09-24 VITALS — BP 128/92 | HR 76 | Temp 97.9°F | Resp 14 | Wt 287.8 lb

## 2013-09-24 DIAGNOSIS — E785 Hyperlipidemia, unspecified: Secondary | ICD-10-CM

## 2013-09-24 DIAGNOSIS — I10 Essential (primary) hypertension: Secondary | ICD-10-CM

## 2013-09-24 DIAGNOSIS — G479 Sleep disorder, unspecified: Secondary | ICD-10-CM

## 2013-09-24 DIAGNOSIS — E119 Type 2 diabetes mellitus without complications: Secondary | ICD-10-CM

## 2013-09-24 DIAGNOSIS — E669 Obesity, unspecified: Secondary | ICD-10-CM

## 2013-09-24 LAB — HM DIABETES EYE EXAM

## 2013-09-24 MED ORDER — ESZOPICLONE 2 MG PO TABS: 2.0000 mg | ORAL_TABLET | Freq: Every day | ORAL | Status: DC

## 2013-09-24 MED ORDER — LOSARTAN POTASSIUM 100 MG PO TABS
100.0000 mg | ORAL_TABLET | Freq: Every day | ORAL | Status: DC
Start: 2013-09-24 — End: 2014-07-18

## 2013-09-24 NOTE — Assessment & Plan Note (Addendum)
Persistently elevated diastolic readings discussed today. He has been checking his blood pressures at home. His Home bps have been 130/80 or less on new dose of losartan. No changes today.

## 2013-09-24 NOTE — Progress Notes (Signed)
Pre-visit discussion using our clinic review tool. No additional management support is needed unless otherwise documented below in the visit note.  

## 2013-09-24 NOTE — Patient Instructions (Addendum)
Your diabetes and cholesterol are fine but your weight is the issue.  Your BMI is 39.  Your are almost "morbidly obese" my AMA standards.  I want you to lose 60 lbs over the next 12 months  to 218 lbs. This will lower your BMI to 30.    This is  Dr Melina Schoolsullo's version of a  "Low GI"  Diet:  It will still lower your blood sugars and allow you to lose 4 to 8  lbs  per month if you follow it carefully.  Your goal with exercise is a minimum of 30 minutes of aerobic exercise 5 days per week (Walking does not count once it becomes easy!)    All of the foods can be found at grocery stores and in bulk at [employer].  The Atkins protein bars and shakes are available in more varieties at Target, WalMart and [employer].     7 AM Breakfast:  Choose from the following:  Low carbohydrate Protein  Shakes (I recommend the EAS AdvantEdge "Carb Control" shakes  Or the low carb shakes by Atkins.    2.5 carbs   Arnold's "Sandwhich Thin"toasted  w/ peanut butter (no jelly: about 20 net carbs  "Bagel Thin" with cream cheese and salmon: about 20 carbs   a scrambled egg/bacon/cheese burrito made with Mission's "carb balance" whole wheat tortilla  (about 10 net carbs )   Avoid cereal and bananas, oatmeal and cream of wheat and grits. They are loaded with carbohydrates!   10 AM: high protein snack  Protein bar by Atkins (the snack size, under 200 cal, usually < 6 net carbs).    A stick of cheese:  Around 1 carb,  100 cal     Dannon Light n Fit [place] Yogurt  (80 cal, 8 carbs)  Other so called "protein bars" and Greek yogurts tend to be loaded with carbohydrates.  Remember, in food advertising, the word "energy" is synonymous for " carbohydrate."  Lunch:   A Sandwich using the bread choices listed, Can use any  Eggs,  lunchmeat, grilled meat or canned tuna), avocado, regular mayo/mustard  and cheese.  A Salad using blue cheese, ranch,  Goddess or vinagrette,  No croutons or "confetti" and no "candied nuts" but regular  nuts OK.   No pretzels or chips.  Pickles and miniature sweet peppers are a good low carb alternative that provide a "crunch"  The bread is the only source of carbohydrate in a sandwich and  can be decreased by trying some of these alternatives to traditional loaf bread  Joseph's makes a pita bread and a flat bread that are 50 cal and 4 net carbs available at BJs and WalMart.  This can be toasted to use with hummous as well  Toufayan makes a low carb flatbread that's 100 cal and 9 net carbs available at [employer] and [employer]  Mission makes 2 sizes of  Low carb whole wheat tortilla  (The large one is 210 cal and 6 net carbs) Avoid "Low fat dressings, as well as Reyne DumasCatalina and [address] dressings They are loaded with sugar!   3 PM/ Mid day  Snack:  Consider  1 ounce of  almonds, walnuts, pistachios, pecans, peanuts,  Macadamia nuts or a nut medley.  Avoid "granola"; the dried cranberries and raisins are loaded with carbohydrates. Mixed nuts as long as there are no raisins,  cranberries or dried fruit.     6 PM  Dinner:  Meat/fowl/fish with a green salad, and either broccoli, cauliflower, green beans, spinach, brussel sprouts or  Lima beans. DO NOT BREAD THE PROTEIN!!      There is a low carb pasta by Dreamfield's that is acceptable and tastes great: only 5 digestible carbs/serving.( All grocery stores but BJs carry it )  Try Hurley Cisco Angelo's chicken piccata or chicken or eggplant parm over low carb pasta.(Lowes and BJs)   Marjory Lies Sanchez's "Carnitas" (pulled pork, no sauce,  0 carbs) or his beef pot roast to make a dinner burrito (at [employer]J's)  Pesto over low carb pasta (bj's sells a good quality pesto in the center refrigerated section of the deli   Whole wheat pasta is still full of digestible carbs and  Not as low in glycemic index as Dreamfield's.   Brown rice is still rice,  So skip the rice and noodles if you eat [place] or [place] (or at least limit to 1/2 cup)  9 PM snack :   Breyer's "low  carb" fudgsicle or  ice cream bar (Carb Smart line), or  Weight Watcher's ice cream bar , or another "no sugar added" ice cream;  a serving of fresh berries/cherries with whipped cream   Cheese or DANNON'S LlGHT N [place] YOGURT  Avoid bananas, pineapple, grapes  and watermelon on a regular basis because they are high in sugar.  THINK OF THEM AS DESSERT  Remember that snack Substitutions should be less than 10 NET carbs per serving and meals < 20 carbs. Remember to subtract fiber grams to get the "net carbs."

## 2013-09-26 NOTE — Progress Notes (Signed)
Patient ID: Cyndee BrightlyJehovany Olesen, male   DOB: [birth year], 38 y.o.   MRN: [id number]  Patient Active Problem List   Diagnosis Date Noted  . Sleep disturbance 03/17/2013  . Bursitis of knee 09/18/2012  . Diabetes mellitus type 2, diet-controlled 08/02/2012  . Obesity (BMI 30-39.9)   . Hypertension   . Dyslipidemia (high LDL; low HDL)     Subjective:  CC:   Chief Complaint  Patient presents with  . Follow-up  . Diabetes    HPI:   Mark Welch a 38 y.o. male who presents for followup on type 2 diabetes, diet controlled,, care by obesity and dyslipidemia. He has no complaints today. He continues to follow a low glycemic index diet but has not been exercising regularly and has gained weight. His knee pain has improved.  He does not cite joint pain as a reason for the lack of exercise; he has just lost his momentum  due to the holidays. and exercise  Past Medical History  Diagnosis Date  . Obesity (BMI 30-39.9)   . Hypertension   . Hyperlipidemia   . Kidney stones   . GERD (gastroesophageal reflux disease)     No past surgical history on file.     The following portions of the patient's history were reviewed and updated as appropriate: Allergies, current medications, and problem list.    Review of Systems:   12 Pt  review of systems was negative except those addressed in the HPI,     History   Social History  . Marital Status: Single    Spouse Name: N/A    Number of Children: N/A  . Years of Education: N/A   Occupational History  . Not on file.   Social History Main Topics  . Smoking status: Never Smoker   . Smokeless tobacco: Never Used  . Alcohol Use: No  . Drug Use: No  . Sexual Activity: Not on file   Other Topics Concern  . Not on file   Social History Narrative  . No narrative on file    Objective:  Filed Vitals:   09/24/13 0821  BP: 128/92  Pulse: 76  Temp: 97.9 F (36.6 C)  Resp: 14     General appearance: alert, cooperative and  appears stated age Ears: normal TM's and external ear canals both ears Throat: lips, mucosa, and tongue normal; teeth and gums normal Neck: no adenopathy, no carotid bruit, supple, symmetrical, trachea midline and thyroid not enlarged, symmetric, no tenderness/mass/nodules Back: symmetric, no curvature. ROM normal. No CVA tenderness. Lungs: clear to auscultation bilaterally Heart: regular rate and rhythm, S1, S2 normal, no murmur, click, rub or gallop Abdomen: soft, non-tender; bowel sounds normal; no masses,  no organomegaly Pulses: 2+ and symmetric Skin: Skin color, texture, turgor normal. No rashes or lesions Lymph nodes: Cervical, supraclavicular, and axillary nodes normal.  Assessment and Plan: Hypertension Persistently elevated diastolic readings discussed today. He has been checking his blood pressures at home. His Home bps have been 130/80 or less on new dose of losartan. No changes today.  Dyslipidemia (high LDL; low HDL) He is taking simvastatin for estimated 10 year risk of coronary artery disease by Framingham risk calculator of 18%.  lfts are normal   Lab Results  Component Value Date   CHOL 207* 09/21/2013   HDL 33.40* 09/21/2013   LDLDIRECT 147.5 09/21/2013   TRIG 203.0* 09/21/2013   CHOLHDL 6 09/21/2013    Diabetes mellitus type 2, diet-controlled Well-controlled on diet alone .  hemoglobin A1c has been consistently less than 7.0 . Patient is up-to-date on eye exams and foot exam was normal today.  There is  no proteinuria on recent micro urinalysis .  Fasting lipids have been reviewed and statin therapy has been advised and updated according to new ACC guidelines based on patient's 10 year risk of CAD .    Lab Results  Component Value Date   MICROALBUR 1.47 06/18/2013    Lab Results  Component Value Date   HGBA1C 6.7* 09/21/2013     Obesity (BMI 30-39.9) I have addressed  BMI and recommended wt loss of 60 lbs over the next 12 months using a low glycemic index  diet and regular exercise a minimum of 5 days per week.     Updated Medication List Outpatient Encounter Prescriptions as of 09/24/2013  Medication Sig  . eszopiclone (LUNESTA) 2 MG TABS tablet Take 1 tablet (2 mg total) by mouth at bedtime. Take immediately before bedtime  . fexofenadine (ALLEGRA) 180 MG tablet Take 180 mg by mouth daily.  Marland Kitchen losartan (COZAAR) 100 MG tablet Take 1 tablet (100 mg total) by mouth daily.  Marland Kitchen omeprazole (PRILOSEC) 40 MG capsule Take 1 capsule (40 mg total) by mouth daily.  . simvastatin (ZOCOR) 40 MG tablet Take 1 tablet (40 mg total) by mouth at bedtime.  . [DISCONTINUED] eszopiclone (LUNESTA) 2 MG TABS tablet Take 1 tablet (2 mg total) by mouth at bedtime. Take immediately before bedtime  . [DISCONTINUED] losartan (COZAAR) 50 MG tablet Take 2 tablets (100 mg total) by mouth daily.  . [DISCONTINUED] amitriptyline (ELAVIL) 25 MG tablet Take 1 tablet (25 mg total) by mouth at bedtime.

## 2013-09-26 NOTE — Assessment & Plan Note (Signed)
Well-controlled on diet alone .  hemoglobin A1c has been consistently less than 7.0 . Patient is up-to-date on eye exams and foot exam was normal today.  There is  no proteinuria on recent micro urinalysis .  Fasting lipids have been reviewed and statin therapy has been advised and updated according to new ACC guidelines based on patient's 10 year risk of CAD .    Lab Results  Component Value Date   MICROALBUR 1.47 06/18/2013    Lab Results  Component Value Date   HGBA1C 6.7* 09/21/2013

## 2013-09-26 NOTE — Assessment & Plan Note (Signed)
I have addressed  BMI and recommended wt loss of 60 lbs over the next 12 months using a low glycemic index diet and regular exercise a minimum of 5 days per week.     

## 2013-09-26 NOTE — Assessment & Plan Note (Signed)
He is taking simvastatin for estimated 10 year risk of coronary artery disease by Framingham risk calculator of 18%.  lfts are normal   Lab Results  Component Value Date   CHOL 207* 09/21/2013   HDL 33.40* 09/21/2013   LDLDIRECT 147.5 09/21/2013   TRIG 203.0* 09/21/2013   CHOLHDL 6 09/21/2013

## 2013-09-28 ENCOUNTER — Telehealth: Payer: Self-pay

## 2013-09-28 NOTE — Telephone Encounter (Signed)
Relevant patient education mailed to patient.  

## 2013-10-07 LAB — HM DIABETES EYE EXAM

## 2013-10-13 ENCOUNTER — Telehealth: Payer: Self-pay | Admitting: Internal Medicine

## 2013-10-13 NOTE — Telephone Encounter (Signed)
Relevant patient education mailed to patient.  

## 2013-11-07 ENCOUNTER — Ambulatory Visit: Payer: Self-pay | Admitting: Emergency Medicine

## 2013-12-31 ENCOUNTER — Ambulatory Visit: Payer: Self-pay | Admitting: Family Medicine

## 2014-01-04 ENCOUNTER — Ambulatory Visit: Payer: Managed Care, Other (non HMO) | Admitting: Internal Medicine

## 2014-01-05 ENCOUNTER — Encounter: Payer: Self-pay | Admitting: Internal Medicine

## 2014-01-05 ENCOUNTER — Ambulatory Visit (INDEPENDENT_AMBULATORY_CARE_PROVIDER_SITE_OTHER): Payer: Managed Care, Other (non HMO) | Admitting: Internal Medicine

## 2014-01-05 VITALS — BP 120/90 | HR 90 | Temp 98.5°F | Resp 16 | Wt 290.8 lb

## 2014-01-05 DIAGNOSIS — E119 Type 2 diabetes mellitus without complications: Secondary | ICD-10-CM

## 2014-01-05 DIAGNOSIS — S43499A Other sprain of unspecified shoulder joint, initial encounter: Secondary | ICD-10-CM

## 2014-01-05 DIAGNOSIS — S46819A Strain of other muscles, fascia and tendons at shoulder and upper arm level, unspecified arm, initial encounter: Secondary | ICD-10-CM

## 2014-01-05 DIAGNOSIS — M62838 Other muscle spasm: Secondary | ICD-10-CM

## 2014-01-05 DIAGNOSIS — G479 Sleep disorder, unspecified: Secondary | ICD-10-CM

## 2014-01-05 DIAGNOSIS — I10 Essential (primary) hypertension: Secondary | ICD-10-CM

## 2014-01-05 DIAGNOSIS — E785 Hyperlipidemia, unspecified: Secondary | ICD-10-CM

## 2014-01-05 LAB — HM DIABETES FOOT EXAM: HM DIABETIC FOOT EXAM: NORMAL

## 2014-01-05 MED ORDER — NABUMETONE 750 MG PO TABS: 750.0000 mg | ORAL_TABLET | Freq: Two times a day (BID) | ORAL | Status: DC

## 2014-01-05 MED ORDER — ESZOPICLONE 2 MG PO TABS: 2.0000 mg | ORAL_TABLET | Freq: Every day | ORAL | Status: DC

## 2014-01-05 NOTE — Patient Instructions (Signed)
Your strained your trapezius muscle  Please stop the ibuprofen and use the nabumetone twice daily for inflammation  You can add  Tylenol 500 every twice daily as well  Save the cyclobenzaprine (muslce relaxer) for night time use since it is sedating  Try alternating between heat and ice applications  15 minutes each

## 2014-01-05 NOTE — Progress Notes (Signed)
Patient ID: Mark Welch, male   DOB: 17-Nov-1975, 38 y.o.   MRN: [id number]  Patient Active Problem List   Diagnosis Date Noted  . Trapezius muscle strain 01/08/2014  . Sleep disturbance 03/17/2013  . Bursitis of knee 09/18/2012  . Diabetes mellitus type 2, diet-controlled 08/02/2012  . Obesity (BMI 30-39.9)   . Hypertension   . Dyslipidemia (high LDL; low HDL)     Subjective:  CC:   Chief Complaint  Patient presents with  . Follow-up    C/O cervical strain [facility] [place] [facility]/13  . Diabetes    HPI:   Mark BrightlyJehovany Lokey is a 38 y.o. male who presents for acute and chronic issues  1) Neck pain.  He developed pain in the top of the scapula on left,  Radiated to side of neck no arm radiation no numbness or tingling.   Marland Kitchen.He was treated for cervical sprain by Urgent Care on Friday and treated with flexeril which has made him drowsy and referred him for [facility]   Cause of pain appears to be work related.  He : works at a [employer] for 6+ hrs ,  And Sleeps on right side . The pain improved initially but has not resolved. Has been taking advil 400 mg  Once daily   2) follow up on diet controlled diabetes, hypertension, and obesity.  He does not check his blood sugars regularly. He has not been exercising lately due to persistent foot pain.  3)  insomnia. He has been taking Lunesta since las visit    Past Medical History  Diagnosis Date  . Obesity (BMI 30-39.9)   . Hypertension   . Hyperlipidemia   . Kidney stones   . GERD (gastroesophageal reflux disease)     No past surgical history on file.     The following portions of the patient's history were reviewed and updated as appropriate: Allergies, current medications, and problem list.    Review of Systems:   Patient denies headache, fevers, malaise, unintentional weight loss, skin rash, eye pain, sinus congestion and sinus pain, sore throat, dysphagia,   hemoptysis , cough, dyspnea, wheezing, chest pain, palpitations, orthopnea, edema, abdominal pain, nausea, melena, diarrhea, constipation, flank pain, dysuria, hematuria, urinary  Frequency, nocturia, numbness, tingling, seizures,  Focal weakness, Loss of consciousness,  Tremor, insomnia, depression, anxiety, and suicidal ideation.     History   Social History  . Marital Status: Single    Spouse Name: N/A    Number of Children: N/A  . Years of Education: N/A   Occupational History  . Not on file.   Social History Main Topics  . Smoking status: Never Smoker   . Smokeless tobacco: Never Used  . Alcohol Use: No  . Drug Use: No  . Sexual Activity: Not on file   Other Topics Concern  . Not on file   Social History Narrative  . No narrative on file    Objective:  Filed Vitals:   01/05/14 1536  BP: 120/90  Pulse: 90  Temp: 98.5 F (36.9 C)  Resp: 16     General appearance: alert, cooperative and appears stated age Ears: normal TM's and external ear canals both ears Throat: lips, mucosa, and tongue normal; teeth and gums normal Neck: no adenopathy, no carotid bruit, supple, symmetrical, trachea midline and thyroid not enlarged, symmetric, no tenderness/mass/nodules Back: symmetric, no curvature. ROM normal. No CVA tenderness. Some tenderness over  trapezius muscles and scapula  Lungs: clear to auscultation bilaterally Heart: regular rate and rhythm, S1, S2 normal, no murmur, click, rub or gallop Abdomen: soft, non-tender; bowel sounds normal; no masses,  no organomegaly Pulses: 2+ and symmetric Skin: Skin color, texture, turgor normal. No rashes or lesions Lymph nodes: Cervical, supraclavicular, and axillary nodes normal.  Assessment and Plan:  Hypertension Well controlled on current regimen. Renal function stable, no changes today.  Diabetes mellitus type 2, diet-controlled Well-controlled on diet alone .  hemoglobin A1c has been consistently less than 7.0 . Patient  is up-to-date on eye exams and foot exam was normal today.  There is  no proteinuria on recent micro urinalysis .  Fasting lipids have been reviewed and statin therapy has been advised and updated according to new ACC guidelines based on patient's 10 year risk of CAD .    Lab Results  Component Value Date   MICROALBUR 0.7 01/05/2014    Lab Results  Component Value Date   HGBA1C 6.5 01/05/2014       Dyslipidemia (high LDL; low HDL) Well controlled on current statin therapy.   Liver enzymes are normal , no changes today.  Lab Results  Component Value Date   CHOL 207* 09/21/2013   HDL 33.40* 09/21/2013   LDLDIRECT 147.5 09/21/2013   TRIG 203.0* 09/21/2013   CHOLHDL 6 09/21/2013    \ Lab Results  Component Value Date   ALT 34 01/05/2014   AST 29 01/05/2014   ALKPHOS 47 01/05/2014   BILITOT 0.6 01/05/2014    Trapezius muscle strain NSAID prescribed.    Updated Medication List Outpatient Encounter Prescriptions as of 01/05/2014  Medication Sig  . cyclobenzaprine (FLEXERIL) 10 MG tablet Take 10 mg by mouth 3 (three) times daily as needed for muscle spasms.  . eszopiclone (LUNESTA) 2 MG TABS tablet Take 1 tablet (2 mg total) by mouth at bedtime. Take immediately before bedtime  . fexofenadine (ALLEGRA) 180 MG tablet Take 180 mg by mouth daily.  Marland Kitchen. losartan (COZAAR) 100 MG tablet Take 1 tablet (100 mg total) by mouth daily.  Marland Kitchen. omeprazole (PRILOSEC) 40 MG capsule Take 1 capsule (40 mg total) by mouth daily.  . simvastatin (ZOCOR) 40 MG tablet Take 1 tablet (40 mg total) by mouth at bedtime.  . [DISCONTINUED] eszopiclone (LUNESTA) 2 MG TABS tablet Take 1 tablet (2 mg total) by mouth at bedtime. Take immediately before bedtime  . nabumetone (RELAFEN) 750 MG tablet Take 1 tablet (750 mg total) by mouth 2 (two) times daily.     Orders Placed This Encounter  Procedures  . Hemoglobin A1c  . Microalbumin / creatinine urine ratio  . Comprehensive metabolic panel  . Magnesium  . HM  DIABETES EYE EXAM  . HM DIABETES FOOT EXAM    Return in about 3 months (around 04/06/2014).

## 2014-01-05 NOTE — Progress Notes (Signed)
Pre-visit discussion using our clinic review tool. No additional management support is needed unless otherwise documented below in the visit note.  

## 2014-01-06 ENCOUNTER — Encounter: Payer: Self-pay | Admitting: *Deleted

## 2014-01-06 LAB — COMPREHENSIVE METABOLIC PANEL
ALK PHOS: 47 U/L (ref 39–117)
ALT: 34 U/L (ref 0–53)
AST: 29 U/L (ref 0–37)
Albumin: 3.9 g/dL (ref 3.5–5.2)
BILIRUBIN TOTAL: 0.6 mg/dL (ref 0.3–1.2)
BUN: 14 mg/dL (ref 6–23)
CO2: 28 meq/L (ref 19–32)
Calcium: 9 mg/dL (ref 8.4–10.5)
Chloride: 105 mEq/L (ref 96–112)
Creatinine, Ser: 0.9 mg/dL (ref 0.4–1.5)
GFR: 94.45 mL/min (ref >60.00)
Glucose, Bld: 87 mg/dL (ref 70–99)
Potassium: 3.7 mEq/L (ref 3.5–5.1)
SODIUM: 138 meq/L (ref 135–145)
Total Protein: 7.5 g/dL (ref 6.0–8.3)

## 2014-01-06 LAB — HEMOGLOBIN A1C: HEMOGLOBIN A1C: 6.5 % (ref 4.6–6.5)

## 2014-01-06 LAB — MICROALBUMIN / CREATININE URINE RATIO
CREATININE, U: 119.2 mg/dL
MICROALB/CREAT RATIO: 0.6 mg/g (ref 0.0–30.0)
Microalb, Ur: 0.7 mg/dL (ref 0.0–1.9)

## 2014-01-06 LAB — MAGNESIUM: Magnesium: 1.9 mg/dL (ref 1.5–2.5)

## 2014-01-08 DIAGNOSIS — S46819A Strain of other muscles, fascia and tendons at shoulder and upper arm level, unspecified arm, initial encounter: Secondary | ICD-10-CM | POA: Insufficient documentation

## 2014-01-08 NOTE — Assessment & Plan Note (Signed)
Well controlled on current regimen. Renal function stable, no changes today. 

## 2014-01-08 NOTE — Assessment & Plan Note (Signed)
Well controlled on current statin therapy.   Liver enzymes are normal , no changes today.  Lab Results  Component Value Date   CHOL 207* 09/21/2013   HDL 33.40* 09/21/2013   LDLDIRECT 147.5 09/21/2013   TRIG 203.0* 09/21/2013   CHOLHDL 6 09/21/2013    \ Lab Results  Component Value Date   ALT 34 01/05/2014   AST 29 01/05/2014   ALKPHOS 47 01/05/2014   BILITOT 0.6 01/05/2014

## 2014-01-08 NOTE — Assessment & Plan Note (Signed)
Well-controlled on diet alone .  hemoglobin A1c has been consistently less than 7.0 . Patient is up-to-date on eye exams and foot exam was normal today.  There is  no proteinuria on recent micro urinalysis .  Fasting lipids have been reviewed and statin therapy has been advised and updated according to new ACC guidelines based on patient's 10 year risk of CAD .    Lab Results  Component Value Date   MICROALBUR 0.7 01/05/2014    Lab Results  Component Value Date   HGBA1C 6.5 01/05/2014

## 2014-01-08 NOTE — Assessment & Plan Note (Signed)
NSAID prescribed.

## 2014-01-18 ENCOUNTER — Encounter: Payer: Self-pay | Admitting: Internal Medicine

## 2014-02-07 ENCOUNTER — Encounter: Payer: Self-pay | Admitting: Internal Medicine

## 2014-03-23 ENCOUNTER — Ambulatory Visit: Payer: Self-pay | Admitting: Internal Medicine

## 2014-03-24 ENCOUNTER — Ambulatory Visit: Payer: Self-pay | Admitting: Internal Medicine

## 2014-03-25 LAB — WBCS, STOOL

## 2014-03-26 LAB — STOOL CULTURE

## 2014-04-01 ENCOUNTER — Ambulatory Visit: Payer: Self-pay | Admitting: Internal Medicine

## 2014-04-03 ENCOUNTER — Ambulatory Visit: Payer: Self-pay | Admitting: Internal Medicine

## 2014-05-04 ENCOUNTER — Other Ambulatory Visit: Payer: Self-pay | Admitting: *Deleted

## 2014-05-04 MED ORDER — OMEPRAZOLE 40 MG PO CPDR: 40.0000 mg | DELAYED_RELEASE_CAPSULE | Freq: Every day | ORAL | Status: DC

## 2014-05-17 LAB — CLOSTRIDIUM DIFFICILE(ARMC)

## 2014-05-24 ENCOUNTER — Ambulatory Visit: Payer: Managed Care, Other (non HMO) | Admitting: Internal Medicine

## 2014-06-28 ENCOUNTER — Ambulatory Visit (INDEPENDENT_AMBULATORY_CARE_PROVIDER_SITE_OTHER): Payer: Managed Care, Other (non HMO) | Admitting: Internal Medicine

## 2014-06-28 ENCOUNTER — Encounter: Payer: Self-pay | Admitting: Internal Medicine

## 2014-06-28 VITALS — BP 124/80 | HR 74 | Temp 98.4°F | Resp 16 | Ht 71.5 in | Wt 285.5 lb

## 2014-06-28 DIAGNOSIS — Z23 Encounter for immunization: Secondary | ICD-10-CM

## 2014-06-28 DIAGNOSIS — E784 Other hyperlipidemia: Secondary | ICD-10-CM

## 2014-06-28 DIAGNOSIS — E119 Type 2 diabetes mellitus without complications: Secondary | ICD-10-CM

## 2014-06-28 DIAGNOSIS — E785 Hyperlipidemia, unspecified: Secondary | ICD-10-CM

## 2014-06-28 DIAGNOSIS — F4010 Social phobia, unspecified: Secondary | ICD-10-CM

## 2014-06-28 DIAGNOSIS — E669 Obesity, unspecified: Secondary | ICD-10-CM

## 2014-06-28 LAB — COMPREHENSIVE METABOLIC PANEL
ALT: 33 U/L (ref 0–53)
AST: 26 U/L (ref 0–37)
Albumin: 3.3 g/dL — ABNORMAL LOW (ref 3.5–5.2)
Alkaline Phosphatase: 52 U/L (ref 39–117)
BILIRUBIN TOTAL: 0.8 mg/dL (ref 0.2–1.2)
BUN: 15 mg/dL (ref 6–23)
CO2: 24 meq/L (ref 19–32)
CREATININE: 1 mg/dL (ref 0.4–1.5)
Calcium: 9.4 mg/dL (ref 8.4–10.5)
Chloride: 103 mEq/L (ref 96–112)
GFR: 91.96 mL/min (ref >60.00)
Glucose, Bld: 104 mg/dL — ABNORMAL HIGH (ref 70–99)
Potassium: 3.7 mEq/L (ref 3.5–5.1)
Sodium: 141 mEq/L (ref 135–145)
Total Protein: 7.5 g/dL (ref 6.0–8.3)

## 2014-06-28 LAB — HEMOGLOBIN A1C: Hgb A1c MFr Bld: 6.4 % (ref 4.6–6.5)

## 2014-06-28 LAB — MICROALBUMIN / CREATININE URINE RATIO
CREATININE, U: 112.4 mg/dL
MICROALB/CREAT RATIO: 0.8 mg/g (ref 0.0–30.0)
Microalb, Ur: 0.9 mg/dL (ref 0.0–1.9)

## 2014-06-28 MED ORDER — PAROXETINE HCL 10 MG PO TABS: 10.0000 mg | ORAL_TABLET | Freq: Every day | ORAL | Status: DC

## 2014-06-28 MED ORDER — ALPRAZOLAM 0.5 MG PO TABS: 0.5000 mg | ORAL_TABLET | Freq: Every day | ORAL | Status: DC | PRN

## 2014-06-28 MED ORDER — ALPRAZOLAM 0.5 MG PO TBDP: 0.5000 mg | ORAL_TABLET | Freq: Two times a day (BID) | ORAL | Status: DC | PRN

## 2014-06-28 NOTE — Patient Instructions (Signed)
I am prescribing paxil to take daily for your social anxiety. Start with 1/2 a tablet daily for the first 4 days,  And increase to a full tablet if you do not experience any nausea.  Save the alprazolam for episodes of panic or extreme anxiety.  It can be sedating,  So start with 1/2 tablet and repeat dose in 20 minutes if not calmed down, .  DO NOT combine with alcohol or share with others because  it is a controlled substance.

## 2014-06-28 NOTE — Progress Notes (Signed)
Pre-visit discussion using our clinic review tool. No additional management support is needed unless otherwise documented below in the visit note.  

## 2014-06-28 NOTE — Progress Notes (Signed)
Patient ID: Mark Welch, male   DOB: [birth year], 38 y.o.   MRN: [id number]  Patient Active Problem List   Diagnosis Date Noted  . Childhood social anxiety disorder 06/29/2014  . Trapezius muscle strain 01/08/2014  . Sleep disturbance 03/17/2013  . Bursitis of knee 09/18/2012  . Diabetes mellitus type 2, diet-controlled 08/02/2012  . Obesity (BMI 30-39.9)   . Hypertension   . Dyslipidemia (high LDL; low HDL)     Subjective:  CC:   Chief Complaint  Patient presents with  . Follow-up    Medication refill  . Anxiety    Patient stated he is forgetting things, and having trouble with anxiety    HPI:   Mark Welch is a 38 y.o. male who presents for   Follow up on DM Type 2, , hypertension and obesity.  New issue:  Anxiety getting out of control ,  Occurring With social situations,  ocurring at work and with large family gatherings.  Reacts to crowds by trying to isolate himself,    History of painful shyness as an adolescent,   Feels like he is forgetting things  A lot,  Not with driving, more with home chores .  Working 40 hours per week,  Has 2 roommates   No panic attacks, but doesn't' handle small difficulties appropriately,    Past Medical History  Diagnosis Date  . Obesity (BMI 30-39.9)   . Hypertension   . Hyperlipidemia   . Kidney stones   . GERD (gastroesophageal reflux disease)     History reviewed. No pertinent past surgical history.     The following portions of the patient's history were reviewed and updated as appropriate: Allergies, current medications, and problem list.    Review of Systems:   Patient denies headache, fevers, malaise, unintentional weight loss, skin rash, eye pain, sinus congestion and sinus pain, sore throat, dysphagia,  hemoptysis , cough, dyspnea, wheezing, chest pain, palpitations, orthopnea, edema, abdominal pain, nausea, melena, diarrhea, constipation, flank pain, dysuria, hematuria, urinary   Frequency, nocturia, numbness, tingling, seizures,  Focal weakness, Loss of consciousness,  Tremor, insomnia, depression, anxiety, and suicidal ideation.     History   Social History  . Marital Status: Single    Spouse Name: N/A    Number of Children: N/A  . Years of Education: N/A   Occupational History  . Not on file.   Social History Main Topics  . Smoking status: Never Smoker   . Smokeless tobacco: Never Used  . Alcohol Use: No  . Drug Use: No  . Sexual Activity: Not on file   Other Topics Concern  . Not on file   Social History Narrative  . No narrative on file    Objective:  Filed Vitals:   06/28/14 1346  BP: 124/80  Pulse: 74  Temp: 98.4 F (36.9 C)  Resp: 16     General appearance: alert, cooperative and appears stated age Ears: normal TM's and external ear canals both ears Throat: lips, mucosa, and tongue normal; teeth and gums normal Neck: no adenopathy, no carotid bruit, supple, symmetrical, trachea midline and thyroid not enlarged, symmetric, no tenderness/mass/nodules Back: symmetric, no curvature. ROM normal. No CVA tenderness. Lungs: clear to auscultation bilaterally Heart: regular rate and rhythm, S1, S2 normal, no murmur, click, rub or gallop Abdomen: soft, non-tender; bowel sounds normal; no masses,  no organomegaly Pulses: 2+ and symmetric Skin: Skin color, texture, turgor normal. No rashes or lesions Lymph nodes: Cervical, supraclavicular, and axillary nodes  normal.  Assessment and Plan:  Diabetes mellitus type 2, diet-controlled Well-controlled on diet alone .  hemoglobin A1c has been consistently less than 7.0 . Patient is up-to-date on eye exams and foot exam was normal today.  There is  no proteinuria on recent micro urinalysis .  Fasting lipids have been reviewed and statin therapy has been advised and updated according to new ACC guidelines based on patient's 10 year risk of CAD .    Lab Results  Component Value Date   HGBA1C 6.4  06/28/2014    Lab Results  Component Value Date   MICROALBUR 0.9 06/28/2014          Obesity (BMI 30-39.9) I have addressed  BMI and recommended wt loss of 60 lbs over the next 12 months using a low glycemic index diet and regular exercise a minimum of 5 days per week.      Dyslipidemia (high LDL; low HDL) Well controlled on current statin therapy.   Liver enzymes are normal , no changes today.  Lab Results  Component Value Date   CHOL 207* 09/21/2013   HDL 33.40* 09/21/2013   LDLDIRECT 147.5 09/21/2013   TRIG 203.0* 09/21/2013   CHOLHDL 6 09/21/2013    \ Lab Results  Component Value Date   ALT 33 06/28/2014   AST 26 06/28/2014   ALKPHOS 52 06/28/2014   BILITOT 0.8 06/28/2014      Childhood social anxiety disorder Starting paxil.  Prn alprazolam. The risks and benefits of benzodiazepine use were discussed with patient today including excessive sedation leading to respiratory depression,  impaired thinking/driving, and addiction.  Patient was advised to avoid concurrent use with alcohol, to use medication only as needed and not to share with others  .     Updated Medication List Outpatient Encounter Prescriptions as of 06/28/2014  Medication Sig  . eszopiclone (LUNESTA) 2 MG TABS tablet Take 1 tablet (2 mg total) by mouth at bedtime. Take immediately before bedtime  . fexofenadine (ALLEGRA) 180 MG tablet Take 180 mg by mouth daily.  . fluticasone (FLONASE) 50 MCG/ACT nasal spray Place 2 sprays into both nostrils daily.  Marland Kitchen. losartan (COZAAR) 100 MG tablet Take 1 tablet (100 mg total) by mouth daily.  Marland Kitchen. omeprazole (PRILOSEC) 40 MG capsule Take 1 capsule (40 mg total) by mouth daily.  . simvastatin (ZOCOR) 40 MG tablet Take 1 tablet (40 mg total) by mouth at bedtime.  . ALPRAZolam (XANAX) 0.5 MG tablet Take 1 tablet (0.5 mg total) by mouth daily as needed for anxiety.  Marland Kitchen. PARoxetine (PAXIL) 10 MG tablet Take 1 tablet (10 mg total) by mouth daily.  . [DISCONTINUED]  ALPRAZolam (NIRAVAM) 0.5 MG dissolvable tablet Take 1 tablet (0.5 mg total) by mouth 2 (two) times daily as needed for anxiety.  . [DISCONTINUED] cyclobenzaprine (FLEXERIL) 10 MG tablet Take 10 mg by mouth 3 (three) times daily as needed for muscle spasms.  . [DISCONTINUED] nabumetone (RELAFEN) 750 MG tablet Take 1 tablet (750 mg total) by mouth 2 (two) times daily.     Orders Placed This Encounter  Procedures  . Pneumococcal polysaccharide vaccine 23-valent greater than or equal to 2yo subcutaneous/IM  . Comprehensive metabolic panel  . Hemoglobin A1c  . Microalbumin / creatinine urine ratio    Return in about 3 months (around 09/28/2014) for follow up diabetes.

## 2014-06-29 ENCOUNTER — Encounter: Payer: Self-pay | Admitting: Internal Medicine

## 2014-06-29 DIAGNOSIS — F418 Other specified anxiety disorders: Secondary | ICD-10-CM | POA: Insufficient documentation

## 2014-06-29 NOTE — Assessment & Plan Note (Addendum)
Starting paxil.  Prn alprazolam. The risks and benefits of benzodiazepine use were discussed with patient today including excessive sedation leading to respiratory depression,  impaired thinking/driving, and addiction.  Patient was advised to avoid concurrent use with alcohol, to use medication only as needed and not to share with others  .

## 2014-06-29 NOTE — Assessment & Plan Note (Signed)
I have addressed  BMI and recommended wt loss of 60 lbs over the next 12 months using a low glycemic index diet and regular exercise a minimum of 5 days per week.

## 2014-06-29 NOTE — Assessment & Plan Note (Signed)
Well controlled on current statin therapy.   Liver enzymes are normal , no changes today.  Lab Results  Component Value Date   CHOL 207* 09/21/2013   HDL 33.40* 09/21/2013   LDLDIRECT 147.5 09/21/2013   TRIG 203.0* 09/21/2013   CHOLHDL 6 09/21/2013    \ Lab Results  Component Value Date   ALT 33 06/28/2014   AST 26 06/28/2014   ALKPHOS 52 06/28/2014   BILITOT 0.8 06/28/2014

## 2014-06-29 NOTE — Assessment & Plan Note (Signed)
Well-controlled on diet alone .  hemoglobin A1c has been consistently less than 7.0 . Patient is up-to-date on eye exams and foot exam was normal today.  There is  no proteinuria on recent micro urinalysis .  Fasting lipids have been reviewed and statin therapy has been advised and updated according to new ACC guidelines based on patient's 10 year risk of CAD .    Lab Results  Component Value Date   HGBA1C 6.4 06/28/2014    Lab Results  Component Value Date   MICROALBUR 0.9 06/28/2014

## 2014-06-30 ENCOUNTER — Encounter: Payer: Self-pay | Admitting: *Deleted

## 2014-07-18 ENCOUNTER — Telehealth: Payer: Self-pay | Admitting: Internal Medicine

## 2014-07-18 ENCOUNTER — Other Ambulatory Visit: Payer: Self-pay | Admitting: *Deleted

## 2014-07-18 DIAGNOSIS — I10 Essential (primary) hypertension: Secondary | ICD-10-CM

## 2014-07-18 DIAGNOSIS — G479 Sleep disorder, unspecified: Secondary | ICD-10-CM

## 2014-07-18 MED ORDER — ESZOPICLONE 2 MG PO TABS: 2.0000 mg | ORAL_TABLET | Freq: Every day | ORAL | Status: DC

## 2014-07-18 MED ORDER — OMEPRAZOLE 40 MG PO CPDR: 40.0000 mg | DELAYED_RELEASE_CAPSULE | Freq: Every day | ORAL | Status: DC

## 2014-07-18 MED ORDER — LOSARTAN POTASSIUM 100 MG PO TABS: 100.0000 mg | ORAL_TABLET | Freq: Every day | ORAL | Status: DC

## 2014-07-18 NOTE — Telephone Encounter (Signed)
Patient called requesting refill on omeprazole

## 2014-07-18 NOTE — Telephone Encounter (Signed)
Last visit 06/28/14, ok refill?

## 2014-07-18 NOTE — Telephone Encounter (Signed)
Ok to refill,  Authorized in epic and prin3ted

## 2014-07-19 NOTE — Telephone Encounter (Signed)
Faxed

## 2014-09-14 ENCOUNTER — Telehealth: Payer: Self-pay | Admitting: Internal Medicine

## 2014-09-14 ENCOUNTER — Ambulatory Visit: Payer: Managed Care, Other (non HMO) | Admitting: Internal Medicine

## 2014-09-14 DIAGNOSIS — E785 Hyperlipidemia, unspecified: Secondary | ICD-10-CM

## 2014-09-14 DIAGNOSIS — E119 Type 2 diabetes mellitus without complications: Secondary | ICD-10-CM

## 2014-09-14 DIAGNOSIS — I10 Essential (primary) hypertension: Secondary | ICD-10-CM

## 2014-09-14 NOTE — Telephone Encounter (Signed)
Patient would like to know if he can have his labs before his 10/04/14 appointment.  An appointment was made for the labs on 09/27/14.  Please advise if he can keep this appointment.

## 2014-09-14 NOTE — Telephone Encounter (Signed)
His labs cannot be done before jan 20th for insurance purposes.  A1c testing prior to 90 days is not reimbursed. Reschedule his lab appt only

## 2014-09-14 NOTE — Telephone Encounter (Signed)
Lab rescheduled to 09/29/14.

## 2014-09-25 LAB — HM DIABETES EYE EXAM

## 2014-09-27 ENCOUNTER — Other Ambulatory Visit: Payer: Managed Care, Other (non HMO)

## 2014-09-28 ENCOUNTER — Other Ambulatory Visit (INDEPENDENT_AMBULATORY_CARE_PROVIDER_SITE_OTHER): Payer: Managed Care, Other (non HMO)

## 2014-09-28 DIAGNOSIS — I10 Essential (primary) hypertension: Secondary | ICD-10-CM

## 2014-09-28 DIAGNOSIS — E784 Other hyperlipidemia: Secondary | ICD-10-CM

## 2014-09-28 DIAGNOSIS — E785 Hyperlipidemia, unspecified: Secondary | ICD-10-CM

## 2014-09-28 DIAGNOSIS — E119 Type 2 diabetes mellitus without complications: Secondary | ICD-10-CM

## 2014-09-28 LAB — COMPREHENSIVE METABOLIC PANEL
ALK PHOS: 47 U/L (ref 39–117)
ALT: 31 U/L (ref 0–53)
AST: 26 U/L (ref 0–37)
Albumin: 4.1 g/dL (ref 3.5–5.2)
BILIRUBIN TOTAL: 0.6 mg/dL (ref 0.2–1.2)
BUN: 16 mg/dL (ref 6–23)
CO2: 30 mEq/L (ref 19–32)
Calcium: 9.3 mg/dL (ref 8.4–10.5)
Chloride: 104 mEq/L (ref 96–112)
Creatinine, Ser: 0.92 mg/dL (ref 0.40–1.50)
GFR: 96.47 mL/min (ref >60.00)
GLUCOSE: 121 mg/dL — AB (ref 70–99)
POTASSIUM: 4 meq/L (ref 3.5–5.1)
Sodium: 139 mEq/L (ref 135–145)
Total Protein: 7.2 g/dL (ref 6.0–8.3)

## 2014-09-28 LAB — LIPID PANEL
CHOL/HDL RATIO: 7
Cholesterol: 231 mg/dL — ABNORMAL HIGH (ref 0–200)
HDL: 33.8 mg/dL — ABNORMAL LOW (ref >39.00)
NonHDL: 197.2
Triglycerides: 204 mg/dL — ABNORMAL HIGH (ref 0.0–149.0)
VLDL: 40.8 mg/dL — AB (ref 0.0–40.0)

## 2014-09-28 LAB — HEMOGLOBIN A1C: Hgb A1c MFr Bld: 6.3 % (ref 4.6–6.5)

## 2014-09-28 LAB — LDL CHOLESTEROL, DIRECT: Direct LDL: 170 mg/dL

## 2014-09-29 ENCOUNTER — Other Ambulatory Visit: Payer: Managed Care, Other (non HMO)

## 2014-10-04 ENCOUNTER — Encounter: Payer: Self-pay | Admitting: Internal Medicine

## 2014-10-04 ENCOUNTER — Encounter: Payer: Self-pay | Admitting: *Deleted

## 2014-10-04 ENCOUNTER — Ambulatory Visit (INDEPENDENT_AMBULATORY_CARE_PROVIDER_SITE_OTHER): Payer: Managed Care, Other (non HMO) | Admitting: Internal Medicine

## 2014-10-04 VITALS — BP 122/86 | HR 79 | Resp 14 | Ht 71.5 in | Wt 279.5 lb

## 2014-10-04 DIAGNOSIS — E784 Other hyperlipidemia: Secondary | ICD-10-CM

## 2014-10-04 DIAGNOSIS — F4010 Social phobia, unspecified: Secondary | ICD-10-CM

## 2014-10-04 DIAGNOSIS — E785 Hyperlipidemia, unspecified: Secondary | ICD-10-CM

## 2014-10-04 DIAGNOSIS — E669 Obesity, unspecified: Secondary | ICD-10-CM

## 2014-10-04 DIAGNOSIS — E119 Type 2 diabetes mellitus without complications: Secondary | ICD-10-CM

## 2014-10-04 DIAGNOSIS — I10 Essential (primary) hypertension: Secondary | ICD-10-CM

## 2014-10-04 MED ORDER — PAROXETINE HCL 20 MG PO TABS: 20.0000 mg | ORAL_TABLET | Freq: Every day | ORAL | Status: DC

## 2014-10-04 NOTE — Progress Notes (Signed)
Pre visit review using our clinic review tool, if applicable. No additional management support is needed unless otherwise documented below in the visit note. 

## 2014-10-04 NOTE — Patient Instructions (Addendum)
We will increase the paxil to 20 mg daily   You can use the xanax as needed   Resume the simvastatin daily to lower your risk of heart attack  You do not need to check your sugars yet because your a1c is very low (good) at 6.3
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
## 2014-10-04 NOTE — Assessment & Plan Note (Addendum)
Untreated lipids are elevated. Advised to resume simvastatin .   Lab Results  Component Value Date   CHOL 231* 09/28/2014   HDL 33.80* 09/28/2014   LDLDIRECT 170.0 09/28/2014   TRIG 204.0* 09/28/2014   CHOLHDL 7 09/28/2014

## 2014-10-05 NOTE — Assessment & Plan Note (Signed)
I have congratulated him in reduction of   BMI and encouraged  Continued weight loss with goal of 10% of body weigh over the next 6 months using a low glycemic index diet and regular exercise a minimum of 5 days per week.   

## 2014-10-05 NOTE — Assessment & Plan Note (Signed)
Well controlled on current regimen. Renal function stable, no changes today.  Lab Results  Component Value Date   CREATININE 0.92 09/28/2014   Lab Results  Component Value Date   NA 139 09/28/2014   K 4.0 09/28/2014   CL 104 09/28/2014   CO2 30 09/28/2014

## 2014-10-05 NOTE — Assessment & Plan Note (Signed)
i have increased the paxil to 20 mg daily given his persistent symptoms.

## 2014-10-05 NOTE — Assessment & Plan Note (Signed)
Well-controlled on diet alone .  hemoglobin A1c has been consistently less than 7.0 . Patient is up-to-date on eye exams and foot exam was normal today.  There is  no proteinuria on recent micro urinalysis .  Fasting lipids have been reviewed and statin therapy has been advised and updated according to new ACC guidelines based on patient's 10 year risk of CAD .    Lab Results  Component Value Date   HGBA1C 6.3 09/28/2014    Lab Results  Component Value Date   MICROALBUR 0.9 06/28/2014

## 2015-01-11 ENCOUNTER — Other Ambulatory Visit: Payer: Managed Care, Other (non HMO)

## 2015-01-12 ENCOUNTER — Telehealth: Payer: Self-pay | Admitting: *Deleted

## 2015-01-12 ENCOUNTER — Other Ambulatory Visit (INDEPENDENT_AMBULATORY_CARE_PROVIDER_SITE_OTHER): Payer: Managed Care, Other (non HMO)

## 2015-01-12 DIAGNOSIS — E785 Hyperlipidemia, unspecified: Secondary | ICD-10-CM

## 2015-01-12 DIAGNOSIS — Z79899 Other long term (current) drug therapy: Secondary | ICD-10-CM

## 2015-01-12 LAB — LIPID PANEL
CHOL/HDL RATIO: 6
Cholesterol: 205 mg/dL — ABNORMAL HIGH (ref 0–200)
HDL: 31.6 mg/dL — ABNORMAL LOW (ref >39.00)
NONHDL: 173.4
Triglycerides: 215 mg/dL — ABNORMAL HIGH (ref 0.0–149.0)
VLDL: 43 mg/dL — ABNORMAL HIGH (ref 0.0–40.0)

## 2015-01-12 LAB — COMPREHENSIVE METABOLIC PANEL
ALT: 24 U/L (ref 0–53)
AST: 18 U/L (ref 0–37)
Albumin: 3.8 g/dL (ref 3.5–5.2)
Alkaline Phosphatase: 47 U/L (ref 39–117)
BILIRUBIN TOTAL: 0.6 mg/dL (ref 0.2–1.2)
BUN: 13 mg/dL (ref 6–23)
CO2: 27 mEq/L (ref 19–32)
CREATININE: 0.97 mg/dL (ref 0.40–1.50)
Calcium: 9.2 mg/dL (ref 8.4–10.5)
Chloride: 105 mEq/L (ref 96–112)
GFR: 90.63 mL/min (ref >60.00)
Glucose, Bld: 111 mg/dL — ABNORMAL HIGH (ref 70–99)
Potassium: 4 mEq/L (ref 3.5–5.1)
Sodium: 138 mEq/L (ref 135–145)
Total Protein: 7 g/dL (ref 6.0–8.3)

## 2015-01-12 LAB — LDL CHOLESTEROL, DIRECT: LDL DIRECT: 138 mg/dL

## 2015-01-12 NOTE — Telephone Encounter (Signed)
Labs and dx?  

## 2015-01-18 ENCOUNTER — Ambulatory Visit: Payer: Managed Care, Other (non HMO) | Admitting: Internal Medicine

## 2015-02-01 ENCOUNTER — Ambulatory Visit: Payer: Managed Care, Other (non HMO) | Admitting: Internal Medicine

## 2015-02-21 ENCOUNTER — Ambulatory Visit (INDEPENDENT_AMBULATORY_CARE_PROVIDER_SITE_OTHER): Payer: Managed Care, Other (non HMO) | Admitting: Urology

## 2015-02-21 ENCOUNTER — Encounter: Payer: Self-pay | Admitting: Urology

## 2015-02-21 VITALS — BP 126/83 | HR 80 | Resp 18 | Ht 71.0 in | Wt 282.4 lb

## 2015-02-21 DIAGNOSIS — E669 Obesity, unspecified: Secondary | ICD-10-CM | POA: Diagnosis not present

## 2015-02-21 DIAGNOSIS — N529 Male erectile dysfunction, unspecified: Secondary | ICD-10-CM

## 2015-02-21 LAB — URINALYSIS, COMPLETE
Bilirubin, UA: NEGATIVE
GLUCOSE, UA: NEGATIVE
KETONES UA: NEGATIVE
LEUKOCYTES UA: NEGATIVE
NITRITE UA: NEGATIVE
PROTEIN UA: NEGATIVE
Specific Gravity, UA: 1.02 (ref 1.005–1.030)
Urobilinogen, Ur: 0.2 mg/dL (ref 0.2–1.0)
pH, UA: 5 (ref 5.0–7.5)

## 2015-02-21 LAB — MICROSCOPIC EXAMINATION: Bacteria, UA: NONE SEEN

## 2015-02-21 MED ORDER — TADALAFIL 20 MG PO TABS: 20.0000 mg | ORAL_TABLET | Freq: Every day | ORAL | Status: DC | PRN

## 2015-02-21 NOTE — Progress Notes (Signed)
02/21/2015 10:07 AM   Mark Welch 1975-12-01 [id number]  Referring provider: Sherlene Shams, MD [address] [place], [place] [address]  Chief Complaint  Patient presents with  . Erectile Dysfunction    HPI: Mark Welch is a 39 y/o Hispanic male who presents today as a self referral for ED.  He has been suffering with ED for the last several years.  He was originally seen by Dr. Rosann Auerbach and started on Cialis, on demand dosing.  This was effective for a while and then his ED resolved and he stopped the Cialis.  He hasn't taken the Cialis in over a year.  He has been having an increase in his ED over the last year.  He c/o difficulty maintaining erections.  He loses the erections when he ejaculates prematurely;.  He is able to achieve a firm erection.  He does not have curvature or pain with his erections.  He is still having spontaneous tumescence.  His SHIM score today is 12 (mild-moderate).    He has a prior h/o of stones, but he denies any current flank pain, hematuria or passage of fragments.   He does not c/o urinary difficulty.  He also denies any fevers, chills, nausea or vomiting.  He has gained a lot of weight over the last year and he is a diet controlled diabetic.   He also has high cholesterol. He is on a statin.   He stated that his thyroid functions have been tested and are normal.         SHIM      02/21/15 0946       SHIM: Over the last 6 months:   How do you rate your confidence that you could get and keep an erection? Moderate     When you had erections with sexual stimulation, how often were your erections hard enough for penetration (entering your partner)? Sometimes (about half the time)     During sexual intercourse, how often were you able to maintain your erection after you had penetrated (entered) your partner? Very Difficult     During sexual intercourse, how difficult was it to maintain your erection to completion of intercourse?  Very Difficult     When you attempted sexual intercourse, how often was it satisfactory for you? Very Difficult     SHIM Total Score   SHIM 12         PMH: Past Medical History  Diagnosis Date  . Obesity (BMI 30-39.9)   . Hypertension   . Hyperlipidemia   . Kidney stones   . GERD (gastroesophageal reflux disease)     Surgical History: History reviewed. No pertinent past surgical history.  Home Medications:    Medication List       This list is accurate as of: 02/21/15 10:07 AM.  Always use your most recent med list.               ALPRAZolam 0.5 MG tablet  Commonly known as:  XANAX  Take 1 tablet (0.5 mg total) by mouth daily as needed for anxiety.     eszopiclone 2 MG Tabs tablet  Commonly known as:  LUNESTA  Take 1 tablet (2 mg total) by mouth at bedtime. Take immediately before bedtime     fexofenadine 180 MG tablet  Commonly known as:  ALLEGRA  Take 180 mg by mouth daily.     fluticasone 50 MCG/ACT nasal spray  Commonly known as:  FLONASE  Place  2 sprays into both nostrils daily.     losartan 100 MG tablet  Commonly known as:  COZAAR  Take 1 tablet (100 mg total) by mouth daily.     omeprazole 40 MG capsule  Commonly known as:  PRILOSEC  Take 1 capsule (40 mg total) by mouth daily.     PARoxetine 20 MG tablet  Commonly known as:  PAXIL  Take 1 tablet (20 mg total) by mouth daily.     simvastatin 40 MG tablet  Commonly known as:  ZOCOR  Take 1 tablet (40 mg total) by mouth at bedtime.        Allergies: No Known Allergies  Family History: Family History  Problem Relation Age of Onset  . Hypertension Mother   . Cancer Mother     ovarian  . Cancer Father     kidney    Social History:  reports that he has never smoked. He has never used smokeless tobacco. He reports that he does not drink alcohol or use illicit drugs.  ROS: Urological Symptom Review  Patient is experiencing the following symptoms: Erectile Dysfunction   Review of  Systems  Gastrointestinal (upper)  : Indigestion/heartburn  Gastrointestinal (lower) : Negative for lower GI symptoms  Constitutional : Fatigue  Skin: Negative for skin symptoms  Eyes: Negative for eye symptoms  Ear/Nose/Throat : Negative for Ear/Nose/Throat symptoms  Hematologic/Lymphatic: Negative for Hematologic/Lymphatic symptoms  Cardiovascular : Negative for cardiovascular symptoms  Respiratory : Negative for respiratory symptoms  Endocrine: Negative for endocrine symptoms  Musculoskeletal: Back pain  Neurological: Negative for neurological symptoms  Psychologic: Depression Anxiety   Physical Exam: BP 126/83 mmHg  Pulse 80  Resp 18  Ht  (1.803 m)  Wt 282 lb 6.4 oz (128.096 kg)  BMI 39.40 kg/m2  Constitutional:  Alert and oriented, No acute distress. HEENT: [place] AT, moist mucus membranes.  Trachea midline, no masses. Cardiovascular: No clubbing, cyanosis, or edema. Respiratory: Normal respiratory effort, no increased work of breathing. GI: Abdomen is soft, non tender, non distended, no abdominal masses GU: No CVA tenderness.  Patient with an uncircumcised phallus. Foreskin easily retracted.  Urethral meatus is patent.  No penile discharge. No penile lesions or rashes. Scrotum without lesions, cysts, rashes and/or edema.  Testicles are located scrotally bilaterally. No masses are appreciated in the testicles. Left and right epididymis are normal. Rectal: Patient with  normal sphincter tone. Perineum without scarring or rashes. No rectal masses are appreciated. I could not exam the prostate due to patient clenching his buttocks tissue together.  Skin: No rashes, bruises or suspicious lesions. Lymph: No cervical or inguinal adenopathy. Neurologic: Grossly intact, no focal deficits, moving all 4 extremities. Psychiatric: Normal mood and affect.  Laboratory Data: Lab Results  Component Value Date   WBC 8.9 06/18/2013   HGB 14.1 06/18/2013   HCT  41.4 06/18/2013   MCV 86.2 06/18/2013   PLT 198.0 06/18/2013    Lab Results  Component Value Date   CREATININE 0.97 01/12/2015    No results found for: PSA  No results found for: TESTOSTERONE  Lab Results  Component Value Date   HGBA1C 6.3 09/28/2014    Urinalysis Results for orders placed or performed in visit on 02/21/15  Microscopic Examination  Result Value Ref Range   WBC, UA 0-5 0 -  5 /hpf   RBC, UA 0-2 0 -  2 /hpf   Epithelial Cells (non renal) 0-10 0 - 10 /hpf   Bacteria, UA None seen  None seen/Few  Urinalysis, Complete  Result Value Ref Range   Specific Gravity, UA 1.020 1.005 - 1.030   pH, UA 5.0 5.0 - 7.5   Color, UA Yellow Yellow   Appearance Ur Clear Clear   Leukocytes, UA Negative Negative   Protein, UA Negative Negative/Trace   Glucose, UA Negative Negative   Ketones, UA Negative Negative   RBC, UA 1+ (A) Negative   Bilirubin, UA Negative Negative   Urobilinogen, Ur 0.2 0.2 - 1.0 mg/dL   Nitrite, UA Negative Negative   Microscopic Examination See below:    Pertinent Imaging:   Assessment & Plan:    1. Erectile dysfunction, unspecified erectile dysfunction type-  Patient's SHIM score today is 12 (mild-moderate).  What he describes is mostly a premature ejaculation.  He is taking a SSRI.  He has taken Cialis in the past with good success, so I will restart that medication.  I have given him samples and sent a prescription to his pharmacy.  He will RTC in one month if he does not find the Cialis effective in resolving his ED.  We will discuss other treatment modalities at that time.  If the Cialis if effective, he will RTC in one year for a SHIM and symptom recheck.    Medication Samples have been provided to the patient.  Drug name: Cialis  Qty: 3 tablets  LOT: [id number]  Exp.Date: 07 2018  The patient has been instructed regarding the correct time, dose, and frequency of taking this medication, including desired effects and most common side effects.    - Urinalysis, Complete  2. Obesity- Patient is trying to lose weight.  He is counseled on how his weight is contributing to his ED.    No Follow-up on file.  Michiel Cowboy, PA-C  [facility] [address], [address] [place], [place] [address] [phone]

## 2015-02-28 ENCOUNTER — Ambulatory Visit (INDEPENDENT_AMBULATORY_CARE_PROVIDER_SITE_OTHER): Payer: Managed Care, Other (non HMO) | Admitting: Internal Medicine

## 2015-02-28 ENCOUNTER — Encounter: Payer: Self-pay | Admitting: Internal Medicine

## 2015-02-28 VITALS — BP 122/84 | HR 83 | Temp 98.8°F | Resp 14 | Ht 71.0 in | Wt 279.5 lb

## 2015-02-28 DIAGNOSIS — G479 Sleep disorder, unspecified: Secondary | ICD-10-CM | POA: Diagnosis not present

## 2015-02-28 DIAGNOSIS — F411 Generalized anxiety disorder: Secondary | ICD-10-CM | POA: Diagnosis not present

## 2015-02-28 DIAGNOSIS — M546 Pain in thoracic spine: Secondary | ICD-10-CM

## 2015-02-28 DIAGNOSIS — E784 Other hyperlipidemia: Secondary | ICD-10-CM | POA: Diagnosis not present

## 2015-02-28 DIAGNOSIS — I10 Essential (primary) hypertension: Secondary | ICD-10-CM | POA: Diagnosis not present

## 2015-02-28 DIAGNOSIS — E785 Hyperlipidemia, unspecified: Secondary | ICD-10-CM

## 2015-02-28 DIAGNOSIS — E119 Type 2 diabetes mellitus without complications: Secondary | ICD-10-CM

## 2015-02-28 DIAGNOSIS — E669 Obesity, unspecified: Secondary | ICD-10-CM

## 2015-02-28 MED ORDER — ALPRAZOLAM 0.5 MG PO TABS: 0.5000 mg | ORAL_TABLET | Freq: Every day | ORAL | Status: DC | PRN

## 2015-02-28 MED ORDER — SIMVASTATIN 40 MG PO TABS: 40.0000 mg | ORAL_TABLET | Freq: Every day | ORAL | Status: DC

## 2015-02-28 MED ORDER — OMEPRAZOLE 40 MG PO CPDR: 40.0000 mg | DELAYED_RELEASE_CAPSULE | Freq: Every day | ORAL | Status: DC

## 2015-02-28 MED ORDER — SERTRALINE HCL 50 MG PO TABS: 50.0000 mg | ORAL_TABLET | Freq: Every day | ORAL | Status: DC

## 2015-02-28 MED ORDER — LOSARTAN POTASSIUM 100 MG PO TABS: 100.0000 mg | ORAL_TABLET | Freq: Every day | ORAL | Status: DC

## 2015-02-28 MED ORDER — ESZOPICLONE 2 MG PO TABS: 2.0000 mg | ORAL_TABLET | Freq: Every day | ORAL | Status: DC

## 2015-02-28 NOTE — Progress Notes (Signed)
Pre-visit discussion using our clinic review tool. No additional management support is needed unless otherwise documented below in the visit note.  

## 2015-02-28 NOTE — Progress Notes (Signed)
Subjective:  Patient ID: [name], male    DOB: 18-Jun-1976  Age: 39 y.o. MRN: [id number]  CC: The primary encounter diagnosis was Sleep disturbance. Diagnoses of Essential hypertension, benign, Dyslipidemia (high LDL; low HDL), Generalized anxiety disorder, Diabetes mellitus type 2, diet-controlled, Essential hypertension, Obesity (BMI 30-39.9), and Midline thoracic back pain were also pertinent to this visit.  HPI [facility] [place] presents for follow up on anxiety.  Has been taking paxil with no sustained  improvement .  Symptoms include insomnia, frustration and anger due to increased stress  at work .  Uses the alprazolam sparingly .  Tried weaning himself off of paxil for a few days and saw no change   Not exercising due to thoracic back pain .  Stopped seeing chiropractor after he was sent to PT for neck and back pain .  Pain is aggravated by poorly designed work station.  He has to stoop over to view the computer monitor and the mouse  Which are on a low stationery desk .  Works in  the [employer] at [employer].   Outpatient Prescriptions Prior to Visit  Medication Sig Dispense Refill  . fexofenadine (ALLEGRA) 180 MG tablet Take 180 mg by mouth daily.    . fluticasone (FLONASE) 50 MCG/ACT nasal spray Place 2 sprays into both nostrils daily.    . tadalafil (CIALIS) 20 MG tablet Take 1 tablet (20 mg total) by mouth daily as needed for erectile dysfunction. 6 tablet 12  . ALPRAZolam (XANAX) 0.5 MG tablet Take 1 tablet (0.5 mg total) by mouth daily as needed for anxiety. 30 tablet 0  . eszopiclone (LUNESTA) 2 MG TABS tablet Take 1 tablet (2 mg total) by mouth at bedtime. Take immediately before bedtime 90 tablet 1  . losartan (COZAAR) 100 MG tablet Take 1 tablet (100 mg total) by mouth daily. 90 tablet 3  . omeprazole (PRILOSEC) 40 MG capsule Take 1 capsule (40 mg total) by mouth daily. 90 capsule 3  . PARoxetine (PAXIL) 20 MG tablet Take 1 tablet (20 mg total) by  mouth daily. 30 tablet 3  . simvastatin (ZOCOR) 40 MG tablet Take 1 tablet (40 mg total) by mouth at bedtime. 90 tablet 3   No facility-administered medications prior to visit.    Review of Systems;  Patient denies headache, fevers, malaise, unintentional weight loss, skin rash, eye pain, sinus congestion and sinus pain, sore throat, dysphagia,  hemoptysis , cough, dyspnea, wheezing, chest pain, palpitations, orthopnea, edema, abdominal pain, nausea, melena, diarrhea, constipation, flank pain, dysuria, hematuria, urinary  Frequency, nocturia, numbness, tingling, seizures,  Focal weakness, Loss of consciousness,  Tremor, insomnia, depression, anxiety, and suicidal ideation.      Objective:  BP 122/84 mmHg  Pulse 83  Temp(Src) 98.8 F (37.1 C) (Oral)  Resp 14  Ht 5\' 11"  (1.803 m)  Wt 279 lb 8 oz (126.78 kg)  BMI 39.00 kg/m2  SpO2 95%  BP Readings from Last 3 Encounters:  02/28/15 122/84  02/21/15 126/83  10/04/14 122/86    Wt Readings from Last 3 Encounters:  02/28/15 279 lb 8 oz (126.78 kg)  02/21/15 282 lb 6.4 oz (128.096 kg)  10/04/14 279 lb 8 oz (126.78 kg)    General appearance: alert, cooperative and appears stated age Ears: normal TM's and external ear canals both ears Throat: lips, mucosa, and tongue normal; teeth and gums normal Neck: no adenopathy, no carotid bruit, supple, symmetrical, trachea midline and thyroid not enlarged, symmetric, no tenderness/mass/nodules  Back: symmetric, no curvature. ROM normal. No CVA tenderness. Lungs: clear to auscultation bilaterally Heart: regular rate and rhythm, S1, S2 normal, no murmur, click, rub or gallop Abdomen: soft, non-tender; bowel sounds normal; no masses,  no organomegaly Pulses: 2+ and symmetric Skin: Skin color, texture, turgor normal. No rashes or lesions Lymph nodes: Cervical, supraclavicular, and axillary nodes normal.  Lab Results  Component Value Date   HGBA1C 6.3 09/28/2014   HGBA1C 6.4 06/28/2014    HGBA1C 6.5 01/05/2014    Lab Results  Component Value Date   CREATININE 0.97 01/12/2015   CREATININE 0.92 09/28/2014   CREATININE 1.0 06/28/2014    Lab Results  Component Value Date   WBC 8.9 06/18/2013   HGB 14.1 06/18/2013   HCT 41.4 06/18/2013   PLT 198.0 06/18/2013   GLUCOSE 111* 01/12/2015   CHOL 205* 01/12/2015   TRIG 215.0* 01/12/2015   HDL 31.60* 01/12/2015   LDLDIRECT 138.0 01/12/2015   ALT 24 01/12/2015   AST 18 01/12/2015   NA 138 01/12/2015   K 4.0 01/12/2015   CL 105 01/12/2015   CREATININE 0.97 01/12/2015   BUN 13 01/12/2015   CO2 27 01/12/2015   TSH 1.65 07/31/2012   HGBA1C 6.3 09/28/2014   MICROALBUR 0.9 06/28/2014    No results found.  Assessment & Plan:   Problem List Items Addressed This Visit    Obesity (BMI 30-39.9)    His weight has increasd from a nadir of 260 lbs to 273 currently.  Reviewed  BMI and encouraged  Continued weight loss with goal of 10% of body weight over the next 6 months using a low glycemic index diet and regular exercise a minimum of 5 days per week.          Hypertension    Well controlled on current regimen. Renal function stable, no changes today.  Lab Results  Component Value Date   CREATININE 0.97 01/12/2015   Lab Results  Component Value Date   NA 138 01/12/2015   K 4.0 01/12/2015   CL 105 01/12/2015   CO2 27 01/12/2015           Relevant Medications   losartan (COZAAR) 100 MG tablet   simvastatin (ZOCOR) 40 MG tablet   Diabetes mellitus type 2, diet-controlled    Well-controlled on diet alone .  hemoglobin A1c has been consistently less than 7.0 and he is due for repeat labs in late July . Patient is up-to-date on eye exams and foot exam was normal in January .  There is  no proteinuria on recent micro urinalysis .  Fasting lipids have been reviewed and statin therapy has been advised and updated according to new ACC guidelines based on patient's 10 year risk of CAD .    Lab Results  Component  Value Date   HGBA1C 6.3 09/28/2014    Lab Results  Component Value Date   MICROALBUR 0.9 06/28/2014                   Relevant Medications   losartan (COZAAR) 100 MG tablet   simvastatin (ZOCOR) 40 MG tablet   Generalized anxiety disorder    Present since childhood,  Not improving with current medication.  Discussed medication change to zoloft and titrating dose gradually to 100 mg daily,  Along with recommendations to start a regular exercise program       Thoracic back pain    Secondary to improper work conditions at [employer] center, which cause  him to spend most of the day bent over to view a monitor that is placed at too low a level.  He has several fellow employees who have resigned  due to the work conditions.  Encouraged to contact [employer] if management refuses to address the issue       Dyslipidemia (high LDL; low HDL)   Relevant Medications   simvastatin (ZOCOR) 40 MG tablet   Sleep disturbance - Primary   Relevant Medications   eszopiclone (LUNESTA) 2 MG TABS tablet    Other Visit Diagnoses    Essential hypertension, benign        Relevant Medications    losartan (COZAAR) 100 MG tablet    simvastatin (ZOCOR) 40 MG tablet      A total of 25 minutes of face to face time was spent with patient more than half of which was spent in counselling about the above mentioned conditions  and coordination of care   I have discontinued Mr. Mcbreen's PARoxetine. I am also having him start on sertraline. Additionally, I am having him maintain his fexofenadine, fluticasone, tadalafil, omeprazole, ALPRAZolam, eszopiclone, losartan, and simvastatin.  Meds ordered this encounter  Medications  . omeprazole (PRILOSEC) 40 MG capsule    Sig: Take 1 capsule (40 mg total) by mouth daily.    Dispense:  90 capsule    Refill:  3  . ALPRAZolam (XANAX) 0.5 MG tablet    Sig: Take 1 tablet (0.5 mg total) by mouth daily as needed for anxiety.    Dispense:  30 tablet    Refill:   5  . eszopiclone (LUNESTA) 2 MG TABS tablet    Sig: Take 1 tablet (2 mg total) by mouth at bedtime. Take immediately before bedtime    Dispense:  90 tablet    Refill:  1  . losartan (COZAAR) 100 MG tablet    Sig: Take 1 tablet (100 mg total) by mouth daily.    Dispense:  90 tablet    Refill:  3  . simvastatin (ZOCOR) 40 MG tablet    Sig: Take 1 tablet (40 mg total) by mouth at bedtime.    Dispense:  90 tablet    Refill:  3  . sertraline (ZOLOFT) 50 MG tablet    Sig: Take 1 tablet (50 mg total) by mouth daily.    Dispense:  30 tablet    Refill:  3    Medications Discontinued During This Encounter  Medication Reason  . PARoxetine (PAXIL) 20 MG tablet   . omeprazole (PRILOSEC) 40 MG capsule Reorder  . ALPRAZolam (XANAX) 0.5 MG tablet Reorder  . eszopiclone (LUNESTA) 2 MG TABS tablet Reorder  . losartan (COZAAR) 100 MG tablet Reorder  . simvastatin (ZOCOR) 40 MG tablet Reorder    Follow-up: Return in about 3 months (around 05/31/2015).   Sherlene Shams, MD

## 2015-02-28 NOTE — Patient Instructions (Addendum)
I am changing your medication from Paxil to Zoloft .  We will start at 50 mg initially    If you feel nauseated at that dose,  STOP it for 3 days and resume at 1/2 tablet for the first few days  My goal is to get you to 100 mg for relief of anxiety but this will take some time stay at 50 mg for 2-3 weeks,  And if no appreciable change,  increase to 100 mg   You can switch to nighttime dosing (after dinner )   Try to get some regular exercise to help blow off steam , but give yourself a break between days of exercise (try Tuesday Thursday, and Sunday for starters,  And focus on cardio and core strengthening exercises to strengthen your back )

## 2015-03-02 DIAGNOSIS — M546 Pain in thoracic spine: Secondary | ICD-10-CM | POA: Insufficient documentation

## 2015-03-02 NOTE — Assessment & Plan Note (Signed)
Well controlled on current regimen. Renal function stable, no changes today.  Lab Results  Component Value Date   CREATININE 0.97 01/12/2015   Lab Results  Component Value Date   NA 138 01/12/2015   K 4.0 01/12/2015   CL 105 01/12/2015   CO2 27 01/12/2015

## 2015-03-02 NOTE — Assessment & Plan Note (Signed)
Well-controlled on diet alone .  hemoglobin A1c has been consistently less than 7.0 and he is due for repeat labs in late July . Patient is up-to-date on eye exams and foot exam was normal in January .  There is  no proteinuria on recent micro urinalysis .  Fasting lipids have been reviewed and statin therapy has been advised and updated according to new ACC guidelines based on patient's 10 year risk of CAD .    Lab Results  Component Value Date   HGBA1C 6.3 09/28/2014    Lab Results  Component Value Date   MICROALBUR 0.9 06/28/2014

## 2015-03-02 NOTE — Assessment & Plan Note (Addendum)
Secondary to improper work conditions at [employer] center, which cause him to spend most of the day bent over to view a monitor that is placed at too low a level.  He has several fellow employees who have resigned  due to the work conditions.  Encouraged to contact [employer] if management refuses to address the issue

## 2015-03-02 NOTE — Assessment & Plan Note (Signed)
His weight has increasd from a nadir of 260 lbs to 273 currently.  Reviewed  BMI and encouraged  Continued weight loss with goal of 10% of body weight over the next 6 months using a low glycemic index diet and regular exercise a minimum of 5 days per week.

## 2015-03-02 NOTE — Assessment & Plan Note (Signed)
Present since childhood,  Not improving with current medication.  Discussed medication change to zoloft and titrating dose gradually to 100 mg daily,  Along with recommendations to start a regular exercise program

## 2015-03-25 ENCOUNTER — Ambulatory Visit
Admission: EM | Admit: 2015-03-25 | Discharge: 2015-03-25 | Disposition: A | Discharge status: Home / Self Care | Payer: Managed Care, Other (non HMO) | Attending: Family Medicine | Admitting: Family Medicine

## 2015-03-25 ENCOUNTER — Encounter: Payer: Self-pay | Admitting: Emergency Medicine

## 2015-03-25 DIAGNOSIS — H6092 Unspecified otitis externa, left ear: Secondary | ICD-10-CM | POA: Diagnosis not present

## 2015-03-25 HISTORY — DX: Other seasonal allergic rhinitis: J30.2

## 2015-03-25 HISTORY — DX: Insomnia, unspecified: G47.00

## 2015-03-25 MED ORDER — CIPROFLOXACIN-DEXAMETHASONE 0.3-0.1 % OT SUSP: 4.0000 [drp] | Freq: Two times a day (BID) | OTIC | Status: AC

## 2015-03-25 NOTE — ED Notes (Signed)
Ear Pain left ear for 3 days

## 2015-03-25 NOTE — ED Provider Notes (Signed)
CSN: [id number]     Arrival date & time 03/25/15  1137 History   First MD Initiated Contact with Patient 03/25/15 1219     Chief Complaint  Patient presents with  . Otalgia   (Consider location/radiation/quality/duration/timing/severity/associated sxs/prior Treatment) HPI  This a 39 year old male who presents today with a left earache that he's had for 3 days. He has been on Ceftin for several days from a sore throat treated by his primary care physician. He has not been swimming nor at altitude is having left ear pain with sounds like "the ocean". He has noticed some drainage from the ear as well. Is unchanged remembers is he's been using a special soap to debride his face and may have gotten into his ear accidentally. He denies any fever or chills.  Past Medical History  Diagnosis Date  . Obesity (BMI 30-39.9)   . Hypertension   . Hyperlipidemia   . Kidney stones   . GERD (gastroesophageal reflux disease)   . Insomnia   . Seasonal allergies    Past Surgical History  Procedure Laterality Date  . No past surgeries     Family History  Problem Relation Age of Onset  . Hypertension Mother   . Cancer Mother     ovarian  . Cancer Father     kidney   History  Substance Use Topics  . Smoking status: Never Smoker   . Smokeless tobacco: Never Used  . Alcohol Use: No    Review of Systems  HENT: Positive for ear discharge, ear pain, hearing loss, sore throat and tinnitus.   All other systems reviewed and are negative.   Allergies  Review of patient's allergies indicates no known allergies.  Home Medications   Prior to Admission medications   Medication Sig Start Date End Date Taking? Authorizing Provider  eszopiclone (LUNESTA) 2 MG TABS tablet Take 1 tablet (2 mg total) by mouth at bedtime. Take immediately before bedtime 02/28/15  Yes Sherlene Shamseresa L Tullo, MD  fexofenadine (ALLEGRA) 180 MG tablet Take 180 mg by mouth daily.   Yes Historical Provider, MD  fluticasone (FLONASE) 50  MCG/ACT nasal spray Place 2 sprays into both nostrils daily.   Yes Historical Provider, MD  losartan (COZAAR) 100 MG tablet Take 1 tablet (100 mg total) by mouth daily. 02/28/15  Yes Sherlene Shamseresa L Tullo, MD  omeprazole (PRILOSEC) 40 MG capsule Take 1 capsule (40 mg total) by mouth daily. 02/28/15  Yes Sherlene Shamseresa L Tullo, MD  sertraline (ZOLOFT) 50 MG tablet Take 1 tablet (50 mg total) by mouth daily. 02/28/15  Yes Sherlene Shamseresa L Tullo, MD  simvastatin (ZOCOR) 40 MG tablet Take 1 tablet (40 mg total) by mouth at bedtime. 02/28/15  Yes Sherlene Shamseresa L Tullo, MD  tadalafil (CIALIS) 20 MG tablet Take 1 tablet (20 mg total) by mouth daily as needed for erectile dysfunction. 02/21/15  Yes Shannon A McGowan, PA-C  ALPRAZolam (XANAX) 0.5 MG tablet Take 1 tablet (0.5 mg total) by mouth daily as needed for anxiety. 02/28/15   Sherlene Shamseresa L Tullo, MD  ciprofloxacin-dexamethasone (CIPRODEX) otic suspension Place 4 drops into the left ear 2 (two) times daily. 03/25/15 04/01/15  Chrissie NoaWilliam P Roemer, PA-C   BP 134/77 mmHg  Pulse 92  Temp(Src) 98.9 F (37.2 C) (Oral)  Resp 18  Ht 6' (1.829 m)  Wt 276 lb (125.193 kg)  BMI 37.42 kg/m2  SpO2 96% Physical Exam  Constitutional: He is oriented to person, place, and time. He appears well-developed and well-nourished.  HENT:  Head: Normocephalic and atraumatic.  Left Ear: External ear normal.  Examination of left ear shows a marked dullness of the eardrum with loss of the light reflex. The canal is normal. Examination of the right ear shows a markedly swollen canal with some maceration present. I'm not able to visualize the TM. Traction on the tragus reproduces his pain. His oropharynx is benign  Neck: Neck supple.  Pulmonary/Chest: Effort normal and breath sounds normal. No respiratory distress. He has no wheezes. He has no rales. He exhibits no tenderness.  Lymphadenopathy:    He has no cervical adenopathy.  Neurological: He is alert and oriented to person, place, and time.  Skin: Skin is warm  and dry.  Psychiatric: He has a normal mood and affect. His behavior is normal. Judgment and thought content normal.  Nursing note and vitals reviewed.   ED Course  Procedures (including critical care time) Labs Review Labs Reviewed - No data to display  Imaging Review No results found.   MDM   1. External otitis of left ear    Discharge Medication List as of 03/25/2015 12:56 PM    START taking these medications   Details  ciprofloxacin-dexamethasone (CIPRODEX) otic suspension Place 4 drops into the left ear 2 (two) times daily., Starting 03/25/2015, Until Sat 04/01/15, Normal      Plan: 1. Diagnosis reviewed with patient 2. rx as per orders; risks, benefits, potential side effects reviewed with patient 3. Recommend supportive treatment with flonase for 1 month. 4. F/u prn if symptoms worsen or don't improve with his PCP.      Lutricia Feil, PA-C 03/25/15 1312

## 2015-03-25 NOTE — Discharge Instructions (Signed)
Otitis Externa °Otitis externa is a bacterial or fungal infection of the outer ear canal. This is the area from the eardrum to the outside of the ear. Otitis externa is sometimes called "swimmer's ear." °CAUSES  °Possible causes of infection include: °· Swimming in dirty water. °· Moisture remaining in the ear after swimming or bathing. °· Mild injury (trauma) to the ear. °· Objects stuck in the ear (foreign body). °· Cuts or scrapes (abrasions) on the outside of the ear. °SIGNS AND SYMPTOMS  °The first symptom of infection is often itching in the ear canal. Later signs and symptoms may include swelling and redness of the ear canal, ear pain, and yellowish-white fluid (pus) coming from the ear. The ear pain may be worse when pulling on the earlobe. °DIAGNOSIS  °Your health care provider will perform a physical exam. A sample of fluid may be taken from the ear and examined for bacteria or fungi. °TREATMENT  °Antibiotic ear drops are often given for 10 to 14 days. Treatment may also include pain medicine or corticosteroids to reduce itching and swelling. °HOME CARE INSTRUCTIONS  °· Apply antibiotic ear drops to the ear canal as prescribed by your health care provider. °· Take medicines only as directed by your health care provider. °· If you have diabetes, follow any additional treatment instructions from your health care provider. °· Keep all follow-up visits as directed by your health care provider. °PREVENTION  °· Keep your ear dry. Use the corner of a towel to absorb water out of the ear canal after swimming or bathing. °· Avoid scratching or putting objects inside your ear. This can damage the ear canal or remove the protective wax that lines the canal. This makes it easier for bacteria and fungi to grow. °· Avoid swimming in lakes, polluted water, or poorly chlorinated pools. °· You may use ear drops made of rubbing alcohol and vinegar after swimming. Combine equal parts of white vinegar and alcohol in a bottle.  Put 3 or 4 drops into each ear after swimming. °SEEK MEDICAL CARE IF:  °· You have a fever. °· Your ear is still red, swollen, painful, or draining pus after 3 days. °· Your redness, swelling, or pain gets worse. °· You have a severe headache. °· You have redness, swelling, pain, or tenderness in the area behind your ear. °MAKE SURE YOU:  °· Understand these instructions. °· Will watch your condition. °· Will get help right away if you are not doing well or get worse. °Document Released: 08/26/2005 Document Revised: 01/10/2014 Document Reviewed: 09/12/2011 °ExitCare® Patient Information ©2015 [employer]. This information is not intended to replace advice given to you by your health care provider. Make sure you discuss any questions you have with your health care provider. ° °Ear Drops °You have been diagnosed with a condition requiring you to put drops of medicine into your outer ear. °HOME CARE INSTRUCTIONS  °· Put drops in the affected ear as instructed. After putting the drops in, you will need to lie down with the affected ear facing up for ten minutes so the drops will remain in the ear canal and run down and fill the canal. Continue using the ear drops for as long as directed by your health care provider. °· Prior to getting up, put a cotton ball gently in your ear canal. Leave enough of the cotton ball out so it can be easily removed. Do not attempt to push this down into the canal with a cotton-tipped swab   or other instrument. °· Do not irrigate or wash out your ears if you have had a perforated eardrum or mastoid surgery, or unless instructed to do so by your health care provider. °· Keep appointments with your health care provider as instructed. °· Finish all medicine, or use for the length of time prescribed by your health care provider. Continue the drops even if your problem seems to be doing well after a couple days, or continue as instructed. °SEEK MEDICAL CARE IF: °· You become worse or develop  increasing pain. °· You notice any unusual drainage from your ear (particularly if the drainage has a bad smell). °· You develop hearing difficulties. °· You experience a serious form of dizziness in which you feel as if the room is spinning, and you feel nauseated (vertigo). °· The outside of your ear becomes red or swollen or both. This may be a sign of an allergic reaction. °MAKE SURE YOU:  °· Understand these instructions. °· Will watch your condition. °· Will get help right away if you are not doing well or get worse. °Document Released: 08/20/2001 Document Revised: 08/31/2013 Document Reviewed: 03/23/2013 °ExitCare® Patient Information ©2015 [employer]. This information is not intended to replace advice given to you by your health care provider. Make sure you discuss any questions you have with your health care provider. ° °

## 2015-03-27 ENCOUNTER — Ambulatory Visit: Payer: Self-pay | Admitting: Urology

## 2015-05-10 ENCOUNTER — Encounter: Payer: Self-pay | Admitting: Internal Medicine

## 2015-05-10 ENCOUNTER — Other Ambulatory Visit: Payer: Self-pay | Admitting: Internal Medicine

## 2015-05-10 MED ORDER — SERTRALINE HCL 100 MG PO TABS: 100.0000 mg | ORAL_TABLET | Freq: Every day | ORAL | Status: DC

## 2015-07-05 ENCOUNTER — Encounter: Payer: Self-pay | Admitting: Internal Medicine

## 2015-07-05 ENCOUNTER — Ambulatory Visit (INDEPENDENT_AMBULATORY_CARE_PROVIDER_SITE_OTHER): Payer: Managed Care, Other (non HMO) | Admitting: Internal Medicine

## 2015-07-05 VITALS — BP 128/80 | HR 83 | Temp 98.6°F | Resp 12 | Ht 72.0 in | Wt 274.2 lb

## 2015-07-05 DIAGNOSIS — E669 Obesity, unspecified: Secondary | ICD-10-CM

## 2015-07-05 DIAGNOSIS — F419 Anxiety disorder, unspecified: Secondary | ICD-10-CM

## 2015-07-05 DIAGNOSIS — F411 Generalized anxiety disorder: Secondary | ICD-10-CM

## 2015-07-05 DIAGNOSIS — F5105 Insomnia due to other mental disorder: Secondary | ICD-10-CM

## 2015-07-05 DIAGNOSIS — E119 Type 2 diabetes mellitus without complications: Secondary | ICD-10-CM | POA: Diagnosis not present

## 2015-07-05 DIAGNOSIS — E784 Other hyperlipidemia: Secondary | ICD-10-CM | POA: Diagnosis not present

## 2015-07-05 DIAGNOSIS — E785 Hyperlipidemia, unspecified: Secondary | ICD-10-CM

## 2015-07-05 DIAGNOSIS — Z23 Encounter for immunization: Secondary | ICD-10-CM | POA: Diagnosis not present

## 2015-07-05 DIAGNOSIS — I10 Essential (primary) hypertension: Secondary | ICD-10-CM | POA: Diagnosis not present

## 2015-07-05 LAB — COMPREHENSIVE METABOLIC PANEL
ALT: 29 U/L (ref 0–53)
AST: 23 U/L (ref 0–37)
Albumin: 4.1 g/dL (ref 3.5–5.2)
Alkaline Phosphatase: 59 U/L (ref 39–117)
BILIRUBIN TOTAL: 0.4 mg/dL (ref 0.2–1.2)
BUN: 13 mg/dL (ref 6–23)
CHLORIDE: 106 meq/L (ref 96–112)
CO2: 27 mEq/L (ref 19–32)
CREATININE: 0.86 mg/dL (ref 0.40–1.50)
Calcium: 9.2 mg/dL (ref 8.4–10.5)
GFR: 103.89 mL/min (ref >60.00)
Glucose, Bld: 98 mg/dL (ref 70–99)
POTASSIUM: 4.1 meq/L (ref 3.5–5.1)
Sodium: 142 mEq/L (ref 135–145)
Total Protein: 7.5 g/dL (ref 6.0–8.3)

## 2015-07-05 LAB — LIPID PANEL
CHOL/HDL RATIO: 6
Cholesterol: 211 mg/dL — ABNORMAL HIGH (ref 0–200)
HDL: 33.7 mg/dL — AB (ref >39.00)
LDL Cholesterol: 137 mg/dL — ABNORMAL HIGH (ref 0–99)
NONHDL: 176.96
Triglycerides: 199 mg/dL — ABNORMAL HIGH (ref 0.0–149.0)
VLDL: 39.8 mg/dL (ref 0.0–40.0)

## 2015-07-05 LAB — MICROALBUMIN / CREATININE URINE RATIO
CREATININE, U: 135.2 mg/dL
Microalb Creat Ratio: 0.7 mg/g (ref 0.0–30.0)
Microalb, Ur: 1 mg/dL (ref 0.0–1.9)

## 2015-07-05 LAB — HEMOGLOBIN A1C: Hgb A1c MFr Bld: 6 % (ref 4.6–6.5)

## 2015-07-05 NOTE — Patient Instructions (Signed)
Diabetes and Exercise Exercising regularly is important. It is not just about losing weight. It has many health benefits, such as:  Improving your overall fitness, flexibility, and endurance.  Increasing your bone density.  Helping with weight control.  Decreasing your body fat.  Increasing your muscle strength.  Reducing stress and tension.  Improving your overall health. People with diabetes who exercise gain additional benefits because exercise:  Reduces appetite.  Improves the body's use of blood sugar (glucose).  Helps lower or control blood glucose.  Decreases blood pressure.  Helps control blood lipids (such as cholesterol and triglycerides).  Improves the body's use of the hormone insulin by:  Increasing the body's insulin sensitivity.  Reducing the body's insulin needs.  Decreases the risk for heart disease because exercising:  Lowers cholesterol and triglycerides levels.  Increases the levels of good cholesterol (such as high-density lipoproteins [HDL]) in the body.  Lowers blood glucose levels. YOUR ACTIVITY PLAN  Choose an activity that you enjoy, and set realistic goals. To exercise safely, you should begin practicing any new physical activity slowly, and gradually increase the intensity of the exercise over time. Your health care provider or diabetes educator can help create an activity plan that works for you. General recommendations include:  Encouraging children to engage in at least 60 minutes of physical activity each day.  Stretching and performing strength training exercises, such as yoga or weight lifting, at least 2 times per week.  Performing a total of at least 150 minutes of moderate-intensity exercise each week, such as brisk walking or water aerobics.  Exercising at least 3 days per week, making sure you allow no more than 2 consecutive days to pass without exercising.  Avoiding long periods of inactivity (90 minutes or more). When you  have to spend an extended period of time sitting down, take frequent breaks to walk or stretch. RECOMMENDATIONS FOR EXERCISING WITH TYPE 1 OR TYPE 2 DIABETES   Check your blood glucose before exercising. If blood glucose levels are greater than 240 mg/dL, check for urine ketones. Do not exercise if ketones are present.  Avoid injecting insulin into areas of the body that are going to be exercised. For example, avoid injecting insulin into:  The arms when playing tennis.  The legs when jogging.  Keep a record of:  Food intake before and after you exercise.  Expected peak times of insulin action.  Blood glucose levels before and after you exercise.  The type and amount of exercise you have done.  Review your records with your health care provider. Your health care provider will help you to develop guidelines for adjusting food intake and insulin amounts before and after exercising.  If you take insulin or oral hypoglycemic agents, watch for signs and symptoms of hypoglycemia. They include:  Dizziness.  Shaking.  Sweating.  Chills.  Confusion.  Drink plenty of water while you exercise to prevent dehydration or heat stroke. Body water is lost during exercise and must be replaced.  Talk to your health care provider before starting an exercise program to make sure it is safe for you. Remember, almost any type of activity is better than none.   This information is not intended to replace advice given to you by your health care provider. Make sure you discuss any questions you have with your health care provider.   Document Released: 11/16/2003 Document Revised: 01/10/2015 Document Reviewed: 02/02/2013 Elsevier Interactive Patient Education 2016 [employer] Inc.  

## 2015-07-05 NOTE — Progress Notes (Signed)
Pre-visit discussion using our clinic review tool. No additional management support is needed unless otherwise documented below in the visit note.  

## 2015-07-05 NOTE — Progress Notes (Signed)
Subjective:  Patient ID: Unk Mark Welch, male    DOB: [birth year]  Age: 39 y.o. MRN: [id number]  CC: The primary encounter diagnosis was Encounter for immunization. Diagnoses of Hyperlipidemia, Diabetes mellitus type 2, diet-controlled (HCC), Dyslipidemia (high LDL; low HDL), Essential hypertension, Obesity (BMI 30-39.9), Generalized anxiety disorder, and Insomnia secondary to anxiety were also pertinent to this visit.  [facility] presents for follow up on diet controlled DM., obesity and hyerptension with hyperlipidemia .  Last seen  In June.  He feels generally well, is exercising twice weekly and checking blood sugars once daily at variable times.  BS have not been checked   Denies any recent hypoglyemic events.  Taking his medications as directed. Following a carbohydrate modified diet 6 days per week. Denies numbness, burning and tingling of extremities. Appetite is good.    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  Viral.  Takes allegra daily  Had the flu shot   Outpatient Prescriptions Prior to Visit  Medication Sig Dispense Refill  . ALPRAZolam (XANAX) 0.5 MG tablet Take 1 tablet (0.5 mg total) by mouth daily as needed for anxiety. 30 tablet 5  . eszopiclone (LUNESTA) 2 MG TABS tablet Take 1 tablet (2 mg total) by mouth at bedtime. Take immediately before bedtime 90 tablet 1  . fexofenadine (ALLEGRA) 180 MG tablet Take 180 mg by mouth daily.    . fluticasone (FLONASE) 50 MCG/ACT nasal spray Place 2 sprays into both nostrils daily.    Marland Kitchen. losartan (COZAAR) 100 MG tablet Take 1 tablet (100 mg total) by mouth daily. 90 tablet 3  . omeprazole (PRILOSEC) 40 MG capsule Take 1 capsule (40 mg total) by mouth daily. 90 capsule 3  . sertraline (ZOLOFT) 100 MG tablet Take 1 tablet  (100 mg total) by mouth daily. 90 tablet 1  . simvastatin (ZOCOR) 40 MG tablet Take 1 tablet (40 mg total) by mouth at bedtime. 90 tablet 3  . tadalafil (CIALIS) 20 MG tablet Take 1 tablet (20 mg total) by mouth daily as needed for erectile dysfunction. 6 tablet 12   No facility-administered medications prior to visit.    Review of Systems;  Patient denies headache, fevers, malaise, unintentional weight loss, skin rash, eye pain, sinus congestion and sinus pain, sore throat, dysphagia,  hemoptysis , cough, dyspnea, wheezing, chest pain, palpitations, orthopnea, edema, abdominal pain, nausea, melena, diarrhea, constipation, flank pain, dysuria, hematuria, urinary  Frequency, nocturia, numbness, tingling, seizures,  Focal weakness, Loss of consciousness,  Tremor, insomnia, depression, anxiety, and suicidal ideation.      Objective:  BP 128/80 mmHg  Pulse 83  Temp(Src) 98.6 F (37 C) (Oral)  Resp 12  Ht 6' (1.829 m)  Wt 274 lb 4 oz (124.399 kg)  BMI 37.19 kg/m2  SpO2 96%  BP Readings from Last 3 Encounters:  07/05/15 128/80  03/25/15 134/77  02/28/15 122/84    Wt Readings from Last 3 Encounters:  07/05/15 274 lb 4 oz (124.399 kg)  03/25/15 276 lb (125.193 kg)  02/28/15 279 lb 8 oz (126.78 kg)    General appearance: alert, cooperative and appears stated age Ears: normal TM's and external ear canals both ears Throat: lips, mucosa, and tongue normal; teeth and gums normal Neck: no adenopathy, no carotid  bruit, supple, symmetrical, trachea midline and thyroid not enlarged, symmetric, no tenderness/mass/nodules Back: symmetric, no curvature. ROM normal. No CVA tenderness. Lungs: clear to auscultation bilaterally Heart: regular rate and rhythm, S1, S2 normal, no murmur, click, rub or gallop Abdomen: soft, non-tender; bowel sounds normal; no masses,  no organomegaly Pulses: 2+ and symmetric Skin: Skin color, texture, turgor normal. No rashes or lesions Lymph nodes: Cervical,  supraclavicular, and axillary nodes normal.  Lab Results  Component Value Date   HGBA1C 6.0 07/05/2015   HGBA1C 6.3 09/28/2014   HGBA1C 6.4 06/28/2014    Lab Results  Component Value Date   CREATININE 0.86 07/05/2015   CREATININE 0.97 01/12/2015   CREATININE 0.92 09/28/2014    Lab Results  Component Value Date   WBC 8.9 06/18/2013   HGB 14.1 06/18/2013   HCT 41.4 06/18/2013   PLT 198.0 06/18/2013   GLUCOSE 98 07/05/2015   CHOL 211* 07/05/2015   TRIG 199.0* 07/05/2015   HDL 33.70* 07/05/2015   LDLDIRECT 138.0 01/12/2015   LDLCALC 137* 07/05/2015   ALT 29 07/05/2015   AST 23 07/05/2015   NA 142 07/05/2015   K 4.1 07/05/2015   CL 106 07/05/2015   CREATININE 0.86 07/05/2015   BUN 13 07/05/2015   CO2 27 07/05/2015   TSH 1.65 07/31/2012   HGBA1C 6.0 07/05/2015   MICROALBUR 1.0 07/05/2015    No results found.  Assessment & Plan:   Problem List Items Addressed This Visit    Obesity (BMI 30-39.9)    I have congratulated him in reduction of   BMI and encouraged  Continued weight loss with goal of 10% of body weigh over the next 6 months using a low glycemic index diet and regular exercise a minimum of 5 days per week.        Hypertension    Well controlled on current regimen. Renal function stable, no changes today.  Lab Results  Component Value Date   CREATININE 0.86 07/05/2015   Lab Results  Component Value Date   NA 142 07/05/2015   K 4.1 07/05/2015   CL 106 07/05/2015   CO2 27 07/05/2015             Dyslipidemia (high LDL; low HDL)    Improvoed since resuming simvastatin . LFTS normal. No changes today   Lab Results  Component Value Date   CHOL 211* 07/05/2015   HDL 33.70* 07/05/2015   LDLCALC 137* 07/05/2015   LDLDIRECT 138.0 01/12/2015   TRIG 199.0* 07/05/2015   CHOLHDL 6 07/05/2015    Lab Results  Component Value Date   ALT 29 07/05/2015   AST 23 07/05/2015   ALKPHOS 59 07/05/2015   BILITOT 0.4 07/05/2015          Diabetes  mellitus type 2, diet-controlled (HCC)    Well-controlled on diet alone .  hemoglobin A1c has been consistently less than 7.0 is up-to-date on eye exams and foot exam was normal today .  There is  no proteinuria on recent micro urinalysis .  Fasting lipids have been reviewed and statin therapy has been prescribed according to new ACC guidelines based on patient's 10 year risk of CAD .    Lab Results  Component Value Date   HGBA1C 6.0 07/05/2015    Lab Results  Component Value Date   MICROALBUR 1.0 07/05/2015   Lab Results  Component Value Date   CHOL 211* 07/05/2015   HDL 33.70* 07/05/2015   LDLCALC 137* 07/05/2015   LDLDIRECT  138.0 01/12/2015   TRIG 199.0* 07/05/2015   CHOLHDL 6 07/05/2015                      Insomnia secondary to anxiety    Prior change from  Ambien to trazodone was not successful in treating insomnia. Managed currently with  Lunesta. 2 mg daily.        Generalized anxiety disorder    Present since childhood,  Improved with change to zoloft titrated gradually to 100 mg daily,  Along with a regular exercise program          Other Visit Diagnoses    Encounter for immunization    -  Primary    Hyperlipidemia          A total of 25 minutes of face to face time was spent with patient more than half of which was spent in counselling about the above mentioned conditions  and coordination of care  I am having Mr. Gair maintain his fexofenadine, fluticasone, tadalafil, omeprazole, ALPRAZolam, eszopiclone, losartan, simvastatin, and sertraline.  No orders of the defined types were placed in this encounter.    There are no discontinued medications.  Follow-up: Return in about 6 months (around 01/03/2016).   Sherlene Shams, MD

## 2015-07-08 ENCOUNTER — Encounter: Payer: Self-pay | Admitting: Internal Medicine

## 2015-07-08 NOTE — Assessment & Plan Note (Signed)
Prior change from  Ambien to trazodone was not successful in treating insomnia. Managed currently with  Lunesta. 2 mg daily.

## 2015-07-08 NOTE — Assessment & Plan Note (Signed)
Present since childhood,  Improved with change to zoloft titrated gradually to 100 mg daily,  Along with a regular exercise program

## 2015-07-08 NOTE — Assessment & Plan Note (Signed)
I have congratulated him in reduction of   BMI and encouraged  Continued weight loss with goal of 10% of body weigh over the next 6 months using a low glycemic index diet and regular exercise a minimum of 5 days per week.   

## 2015-07-08 NOTE — Assessment & Plan Note (Signed)
Well-controlled on diet alone .  hemoglobin A1c has been consistently less than 7.0 is up-to-date on eye exams and foot exam was normal today .  There is  no proteinuria on recent micro urinalysis .  Fasting lipids have been reviewed and statin therapy has been prescribed according to new ACC guidelines based on patient's 10 year risk of CAD .    Lab Results  Component Value Date   HGBA1C 6.0 07/05/2015    Lab Results  Component Value Date   MICROALBUR 1.0 07/05/2015   Lab Results  Component Value Date   CHOL 211* 07/05/2015   HDL 33.70* 07/05/2015   LDLCALC 137* 07/05/2015   LDLDIRECT 138.0 01/12/2015   TRIG 199.0* 07/05/2015   CHOLHDL 6 07/05/2015

## 2015-07-08 NOTE — Assessment & Plan Note (Signed)
Improvoed since resuming simvastatin . LFTS normal. No changes today   Lab Results  Component Value Date   CHOL 211* 07/05/2015   HDL 33.70* 07/05/2015   LDLCALC 137* 07/05/2015   LDLDIRECT 138.0 01/12/2015   TRIG 199.0* 07/05/2015   CHOLHDL 6 07/05/2015    Lab Results  Component Value Date   ALT 29 07/05/2015   AST 23 07/05/2015   ALKPHOS 59 07/05/2015   BILITOT 0.4 07/05/2015

## 2015-07-08 NOTE — Assessment & Plan Note (Signed)
Well controlled on current regimen. Renal function stable, no changes today.  Lab Results  Component Value Date   CREATININE 0.86 07/05/2015   Lab Results  Component Value Date   NA 142 07/05/2015   K 4.1 07/05/2015   CL 106 07/05/2015   CO2 27 07/05/2015

## 2015-07-24 NOTE — Telephone Encounter (Signed)
Mailed unread message to patient.  

## 2015-08-28 ENCOUNTER — Encounter: Payer: Self-pay | Admitting: Emergency Medicine

## 2015-08-28 ENCOUNTER — Ambulatory Visit
Admission: EM | Admit: 2015-08-28 | Discharge: 2015-08-28 | Disposition: A | Discharge status: Home / Self Care | Payer: Managed Care, Other (non HMO) | Attending: Emergency Medicine | Admitting: Emergency Medicine

## 2015-08-28 DIAGNOSIS — J069 Acute upper respiratory infection, unspecified: Secondary | ICD-10-CM

## 2015-08-28 DIAGNOSIS — J029 Acute pharyngitis, unspecified: Secondary | ICD-10-CM | POA: Diagnosis not present

## 2015-08-28 LAB — RAPID STREP SCREEN (MED CTR MEBANE ONLY): Streptococcus, Group A Screen (Direct): NEGATIVE

## 2015-08-28 MED ORDER — MOMETASONE FUROATE 50 MCG/ACT NA SUSP: 2.0000 | Freq: Every day | NASAL | Status: DC

## 2015-08-28 MED ORDER — IBUPROFEN 800 MG PO TABS: 800.0000 mg | ORAL_TABLET | Freq: Four times a day (QID) | ORAL | Status: DC | PRN

## 2015-08-28 NOTE — ED Provider Notes (Signed)
HPI  SUBJECTIVE:  Mark Welch is a 39 y.o. male who presents with nasal congestion, dry cough, sore throat for the past 5 or 6 days. He reports a raspy voice denies muffled voice.Marland Kitchen He states symptoms are worse with exposure to dry air, better with Advil allergies/congestion. He has tried Mucinex, other over-the-counter cold medications, increasing fluids, salt water gargles, humidifier. No nausea, vomiting, for changes, ear pain, sensation of throat swelling shut, neck pain, dental pain, rash, allergy type symptoms, GERD-type symptoms. No body aches, headaches, history of mono, fatigue. No drooling, trismus. Patient has sick contacts with similar symptoms. No antipyretic in the past 4-6 hours. past medical history of GERD, on omeprazole, borderline diabetes that is diet controlled, hypertension, depression/anxiety. No history of asthma, emphysema, COPD.    Past Medical History  Diagnosis Date  . Obesity (BMI 30-39.9)   . Hypertension   . Hyperlipidemia   . Kidney stones   . GERD (gastroesophageal reflux disease)   . Insomnia   . Seasonal allergies     Past Surgical History  Procedure Laterality Date  . No past surgeries      Family History  Problem Relation Age of Onset  . Hypertension Mother   . Cancer Mother     ovarian  . Cancer Father     kidney    Social History  Substance Use Topics  . Smoking status: Never Smoker   . Smokeless tobacco: Never Used  . Alcohol Use: No    No current facility-administered medications for this encounter.  Current outpatient prescriptions:  .  ALPRAZolam (XANAX) 0.5 MG tablet, Take 1 tablet (0.5 mg total) by mouth daily as needed for anxiety., Disp: 30 tablet, Rfl: 5 .  eszopiclone (LUNESTA) 2 MG TABS tablet, Take 1 tablet (2 mg total) by mouth at bedtime. Take immediately before bedtime, Disp: 90 tablet, Rfl: 1 .  ibuprofen (ADVIL,MOTRIN) 800 MG tablet, Take 1 tablet (800 mg total) by mouth every 6 (six) hours as needed.,  Disp: 30 tablet, Rfl: 0 .  losartan (COZAAR) 100 MG tablet, Take 1 tablet (100 mg total) by mouth daily., Disp: 90 tablet, Rfl: 3 .  mometasone (NASONEX) 50 MCG/ACT nasal spray, Place 2 sprays into the nose daily., Disp: 17 g, Rfl: 0 .  omeprazole (PRILOSEC) 40 MG capsule, Take 1 capsule (40 mg total) by mouth daily., Disp: 90 capsule, Rfl: 3 .  sertraline (ZOLOFT) 100 MG tablet, Take 1 tablet (100 mg total) by mouth daily., Disp: 90 tablet, Rfl: 1 .  simvastatin (ZOCOR) 40 MG tablet, Take 1 tablet (40 mg total) by mouth at bedtime., Disp: 90 tablet, Rfl: 3 .  tadalafil (CIALIS) 20 MG tablet, Take 1 tablet (20 mg total) by mouth daily as needed for erectile dysfunction., Disp: 6 tablet, Rfl: 12 .  [DISCONTINUED] fexofenadine (ALLEGRA) 180 MG tablet, Take 180 mg by mouth daily., Disp: , Rfl:   No Known Allergies   ROS  As noted in HPI.   Physical Exam  BP 126/85 mmHg  Pulse 68  Temp(Src) 97.2 F (36.2 C) (Tympanic)  Resp 16  Ht 6' (1.829 m)  Wt 273 lb (123.832 kg)  BMI 37.02 kg/m2  SpO2 98%  Constitutional: Well developed, well nourished, no acute distress Eyes:  EOMI, conjunctiva normal bilaterally HENT: Normocephalic, atraumatic,mucus membranes moist TMs normal bilaterally. Positive nasal congestion, swollen, erythematous turbinates. No sinus tenderness. Positive erythematous oropharynx, uvula midline. Tonsils normal. No exudates. No intraoral ulcers. Airway widely patent. No drooling, trismus, stridor. Lymph:  No cervical lymphadenopathy Respiratory: Normal inspiratory effort lungs clear bilaterally good air movement Cardiovascular: Normal rate regular rhythm no murmurs, rubs, gallops GI: nondistended.  skin: No rash, skin intact Musculoskeletal: no deformities Neurologic: Alert & oriented x 3, no focal neuro deficits Psychiatric: Speech and behavior appropriate   ED Course   Medications - No data to display  Orders Placed This Encounter  Procedures  . Rapid strep  screen    Standing Status: Standing     Number of Occurrences: 1     Standing Expiration Date:     Order Specific Question:  Patient immune status    Answer:  Normal  . Culture, group A strep ([facility] only)    Standing Status: Standing     Number of Occurrences: 1     Standing Expiration Date:     No results found for this or any previous visit (from the past 24 hour(s)). No results found.  ED Clinical Impression  Pharyngitis  URI (upper respiratory infection)   ED Assessment/Plan  Rapid strep negative. Feel that sore throat is viral/caused by nasal congestion/postnasal drip. No evidence of deep space infection at this time. Home with supportive treatment, ibuprofen, Benadryl/Maalox mixture, throat coat Tea, saline nasal irrigation, Nasonex, Mucinex D/Mucinex. follow-up with primary care physician in 2 days or return here if not getting better. No indications for antibiotics at this time. Discussed labs, medical decision-making, plan and follow-up with patient. Patient agrees with plan.   *This clinic note was created using Dragon dictation software. Therefore, there may be occasional mistakes despite careful proofreading.  ?   Domenick GongAshley Bianca Raneri, MD 08/29/15 [id number]

## 2015-08-28 NOTE — ED Notes (Signed)
Patient c/o sore throat and cough for 6 days.

## 2015-08-28 NOTE — Discharge Instructions (Signed)
your rapid strep was negative today. Tylenol and ibuprofen together as needed for pain.  Make sure you drink plenty of extra fluids.  Some people find salt water gargles and  Traditional Medic[employer]'s "Throat Coat" tea helpful. Take 5 mL of liquid Benadryl and 5 mL of Maalox. Mix it together, and then hold it in your mouth for as long as you can and then swallow. You may do this 4 times a day.  Return here for double sickening, or if you have this for more than 10 days and are not getting better. We can reevaluate the need for antibiotics at that time.  Go to www.goodrx.com to look up your medications. This will give you a list of where you can find your prescriptions at the most affordable prices.

## 2015-08-31 LAB — CULTURE, GROUP A STREP (THRC)

## 2015-09-30 ENCOUNTER — Other Ambulatory Visit: Payer: Self-pay | Admitting: Internal Medicine

## 2015-10-02 ENCOUNTER — Ambulatory Visit: Admission: EM | Admit: 2015-10-02 | Discharge: 2015-10-02 | Discharge status: Absent without leave | Payer: 59

## 2015-10-17 ENCOUNTER — Ambulatory Visit
Admission: EM | Admit: 2015-10-17 | Discharge: 2015-10-17 | Disposition: A | Discharge status: Home / Self Care | Payer: Managed Care, Other (non HMO) | Attending: Family Medicine | Admitting: Family Medicine

## 2015-10-17 DIAGNOSIS — J111 Influenza due to unidentified influenza virus with other respiratory manifestations: Secondary | ICD-10-CM

## 2015-10-17 HISTORY — DX: Anxiety disorder, unspecified: F41.9

## 2015-10-17 LAB — RAPID INFLUENZA A&B ANTIGENS: Influenza A (ARMC): NOT DETECTED

## 2015-10-17 LAB — RAPID INFLUENZA A&B ANTIGENS (ARMC ONLY): INFLUENZA B (ARMC): NOT DETECTED

## 2015-10-17 MED ORDER — OSELTAMIVIR PHOSPHATE 75 MG PO CAPS: 75.0000 mg | ORAL_CAPSULE | Freq: Two times a day (BID) | ORAL | Status: DC

## 2015-10-17 NOTE — ED Notes (Signed)
Patient states since last night he has had n/v/fever and body aches.  He also complains of body aches, feeling bloated, and states that his pulse was very fast earlier today.  Denies throat or chest pain.

## 2015-10-17 NOTE — Discharge Instructions (Signed)
° ° °  Tylenol 500 mg tablet 2 - alternate with Ibuprofen/Advil 2 tablets every 6 hours for fever, headache , muscle aches Avoid spicy or greasy food- stay with bland diet-  Increase liquids-preferably water- modify amount of Gatorade Need to void every two hours- Solid food is not imporotant -adequate water is  Rx to be determined in 24 hours-- If symptoms persist or increase - fill Rx  If you are feeling better - treat symptoms , rest  And follow up with PCP  Viral Infections A viral infection can be caused by different types of viruses.Most viral infections are not serious and resolve on their own. However, some infections may cause severe symptoms and may lead to further complications. SYMPTOMS Viruses can frequently cause:  Minor sore throat.  Aches and pains.  Headaches.  Runny nose.  Different types of rashes.  Watery eyes.  Tiredness.  Cough.  Loss of appetite.  Gastrointestinal infections, resulting in nausea, vomiting, and diarrhea. These symptoms do not respond to antibiotics because the infection is not caused by bacteria. However, you might catch a bacterial infection following the viral infection. This is sometimes called a "superinfection." Symptoms of such a bacterial infection may include:  Worsening sore throat with pus and difficulty swallowing.  Swollen neck glands.  Chills and a high or persistent fever.  Severe headache.  Tenderness over the sinuses.  Persistent overall ill feeling (malaise), muscle aches, and tiredness (fatigue).  Persistent cough.  Yellow, green, or brown mucus production with coughing. HOME CARE INSTRUCTIONS   Only take over-the-counter or prescription medicines for pain, discomfort, diarrhea, or fever as directed by your caregiver.  Drink enough water and fluids to keep your urine clear or pale yellow. Sports drinks can provide valuable electrolytes, sugars, and hydration.  Get plenty of rest and maintain proper  nutrition. Soups and broths with crackers or rice are fine. SEEK IMMEDIATE MEDICAL CARE IF:   You have severe headaches, shortness of breath, chest pain, neck pain, or an unusual rash.  You have uncontrolled vomiting, diarrhea, or you are unable to keep down fluids.  You or your child has an oral temperature above 102 F (38.9 C), not controlled by medicine.  Your baby is older than 3 months with a rectal temperature of 102 F (38.9 C) or higher.  Your baby is 60 months old or younger with a rectal temperature of 100.4 F (38 C) or higher. MAKE SURE YOU:   Understand these instructions.  Will watch your condition.  Will get help right away if you are not doing well or get worse.   This information is not intended to replace advice given to you by your health care provider. Make sure you discuss any questions you have with your health care provider.

## 2015-10-17 NOTE — ED Provider Notes (Signed)
CSN: [id number]     Arrival date & time 10/17/15  1454 History   First MD Initiated Contact with Patient 10/17/15 1713     Chief Complaint  Patient presents with  . Generalized Body Aches  . Nausea  . Emesis  . Headache  . Tachycardia  . Bloated   (Consider location/radiation/quality/duration/timing/severity/associated sxs/prior Treatment) HPI   [name] with onset mild headache last night ; episode of vomit x 1 in night; has been drinking water and Gatorade Otherwise anorexic for solid food- tried to eat egg and bacon biscuit for breakfast ; total body muscles ache, long bones also, pulse has been pounding and he is anxious about it Friends have been sick Family with DM- he denies DX   Had a cough and cold about 2 weeks ago--waited a few days and became concerned about cough and phlegm- [facility] prescribed 10 day antibiotic( name unknown) recently completed-- symptoms resolved  Anxious about  new onset today.  Mom alive with ovarian cancer--Dad died with liver cancer  Past Medical History  Diagnosis Date  . Obesity (BMI 30-39.9)   . Hypertension   . Hyperlipidemia   . Kidney stones   . GERD (gastroesophageal reflux disease)   . Insomnia   . Seasonal allergies   . Anxiety    Past Surgical History  Procedure Laterality Date  . No past surgeries     Family History  Problem Relation Age of Onset  . Hypertension Mother   . Cancer Mother     ovarian  . Cancer Father     kidney   Social History  Substance Use Topics  . Smoking status: Never Smoker   . Smokeless tobacco: Never Used  . Alcohol Use: No    Review of Systems Review of 10 systems negative for acute change except as referenced in HPI   Allergies  Maple flavor  Home Medications   Prior to Admission medications   Medication Sig Start Date End Date Taking? Authorizing Provider  losartan (COZAAR) 100 MG tablet Take 1 tablet (100 mg total) by mouth daily. 02/28/15  Yes Sherlene Shams, MD  omeprazole  (PRILOSEC) 40 MG capsule Take 1 capsule (40 mg total) by mouth daily. 02/28/15  Yes Sherlene Shams, MD  ALPRAZolam Prudy Feeler) 0.5 MG tablet Take 1 tablet (0.5 mg total) by mouth daily as needed for anxiety. 02/28/15   Sherlene Shams, MD  eszopiclone (LUNESTA) 2 MG TABS tablet Take 1 tablet (2 mg total) by mouth at bedtime. Take immediately before bedtime 02/28/15   Sherlene Shams, MD  ibuprofen (ADVIL,MOTRIN) 800 MG tablet Take 1 tablet (800 mg total) by mouth every 6 (six) hours as needed. 08/28/15   Domenick Gong, MD  mometasone (NASONEX) 50 MCG/ACT nasal spray Place 2 sprays into the nose daily. 08/28/15   Domenick Gong, MD  oseltamivir (TAMIFLU) 75 MG capsule Take 1 capsule (75 mg total) by mouth every 12 (twelve) hours. 10/17/15   Rae Halsted, PA-C  PARoxetine (PAXIL) 20 MG tablet TAKE 1 TABLET BY MOUTH EVERY DAY 10/03/15   Sherlene Shams, MD  sertraline (ZOLOFT) 100 MG tablet Take 1 tablet (100 mg total) by mouth daily. 05/10/15   Sherlene Shams, MD  simvastatin (ZOCOR) 40 MG tablet Take 1 tablet (40 mg total) by mouth at bedtime. 02/28/15   Sherlene Shams, MD  tadalafil (CIALIS) 20 MG tablet Take 1 tablet (20 mg total) by mouth daily as needed for erectile dysfunction. 02/21/15  Harle Battiest, PA-C   Meds Ordered and Administered this Visit  Medications - No data to display  BP 135/89 mmHg  Pulse 107  Temp(Src) 99 F (37.2 C) (Tympanic)  Resp 17  Ht 6' (1.829 m)  Wt 275 lb (124.739 kg)  BMI 37.29 kg/m2  SpO2 98% No data found.   Physical Exam   Constitutional -alert and oriented, fatigued appearing and anxious about being sick General: tolerating grape gatorade     6 feet   275 pounds   Mildly febrile has taken ibuprofen Head-atraumatic, normocephalic, reports mild headache Eyes- conjunctiva normal, EOMI ,conjugate gaze Ears: grossly normal hearing, canals clear, TM neg Nose- mild congestion clear  rhinorrhea Mouth/throat- mucous membranes moist ,oropharynx  non-erythematous Neck- supple without glandular enlargement CV- p 128  and rhythmn reg Resp-no distress, normal respiratory effort,clear to auscultation bilaterally GI- soft,non-tender,no distention GU-  not examined MSK- non tender, normal ROM, ambulatory, no gait instability,on /off table solo- complains of knees and thigh bones aching Neuro- normal speech and language, no gross focal neurological deficit appreciated,  Skin-warm,dry ,intact;  Psych-mood and affect grossly normal; speech and behavior grossly normal- fatigued  ED Course  Procedures (including critical care time)  Labs Review Labs Reviewed  RAPID INFLUENZA A&B ANTIGENS ([facility] ONLY)   Results for orders placed or performed during the hospital encounter of 10/17/15  Rapid Influenza A&B Antigens ([facility] only)  Result Value Ref Range   Influenza A ([facility]) NOT DETECTED    Influenza B ([facility]) NOT DETECTED      Imaging Review No results found.  Have discussed that Flu is a virus and symptoms can be similar - He is very anxious to have medication Flu swabs reported neg but symptoms package very similar... Have encouraged him to rest through the evening and see how he feels in the morning as this is first day of reported Symptoms. Rx given for Tamiflu if no improvememnt FLuids,hydration, rest ,warm showers, vaporizer-- minimize contacts until afebrile 48 hours  MDM   1. Flu syndrome    Diagnosis and treatment discussed.   Questions fielded, expectations and recommendations reviewed. Discussed follow up and return parameters including no resolution or any worsening condition..  Patient expresses understanding  and agrees to plan.  Will return to [facility] with questions, concerns or exacerbation.     Rae Halsted, PA-C 10/19/15 1701

## 2015-10-19 ENCOUNTER — Encounter: Payer: Self-pay | Admitting: Physician Assistant

## 2016-01-24 ENCOUNTER — Ambulatory Visit (INDEPENDENT_AMBULATORY_CARE_PROVIDER_SITE_OTHER): Payer: BLUE CROSS/BLUE SHIELD | Admitting: Internal Medicine

## 2016-01-24 ENCOUNTER — Encounter: Payer: Self-pay | Admitting: Internal Medicine

## 2016-01-24 VITALS — BP 130/102 | HR 87 | Temp 98.4°F | Resp 12 | Ht 72.0 in | Wt 291.8 lb

## 2016-01-24 DIAGNOSIS — E669 Obesity, unspecified: Secondary | ICD-10-CM

## 2016-01-24 DIAGNOSIS — E785 Hyperlipidemia, unspecified: Secondary | ICD-10-CM | POA: Diagnosis not present

## 2016-01-24 DIAGNOSIS — E119 Type 2 diabetes mellitus without complications: Secondary | ICD-10-CM | POA: Diagnosis not present

## 2016-01-24 DIAGNOSIS — E784 Other hyperlipidemia: Secondary | ICD-10-CM

## 2016-01-24 DIAGNOSIS — I10 Essential (primary) hypertension: Secondary | ICD-10-CM

## 2016-01-24 DIAGNOSIS — F411 Generalized anxiety disorder: Secondary | ICD-10-CM

## 2016-01-24 MED ORDER — PAROXETINE HCL 10 MG PO TABS: ORAL_TABLET | ORAL | Status: DC

## 2016-01-24 MED ORDER — NALTREXONE-BUPROPION HCL ER 8-90 MG PO TB12: ORAL_TABLET | ORAL | Status: DC

## 2016-01-24 MED ORDER — ALPRAZOLAM 0.5 MG PO TABS: 0.5000 mg | ORAL_TABLET | Freq: Every day | ORAL | Status: DC | PRN

## 2016-01-24 NOTE — Patient Instructions (Signed)
Resume your Paxil for anxiety.  Start with 10 mg daily, , you can increase the dose to 20 mg after two weeks if you need to   Do not start the Contrave until you have your anxiety under control   Bupropion; Naltrexone extended-release tablets What is this medicine? BUPROPION; NALTREXONE (byoo PROE pee on; nal TREX one) is a combination product used to promote and maintain weight loss in obese adults or overweight adults who also have weight related medical problems. This medicine should be used with a reduced calorie diet and increased physical activity. This medicine may be used for other purposes; ask your health care provider or pharmacist if you have questions. What should I tell my health care provider before I take this medicine? They need to know if you have any of these conditions: -an eating disorder, such as anorexia or bulimia -diabetes -glaucoma -head injury -heart disease -high blood pressure -history of a drug or alcohol abuse problem -history of a tumor or infection of your brain or spine -history of stroke -history of irregular heartbeat -kidney disease -liver disease -mental illness such as bipolar disorder or psychosis -seizures -suicidal thoughts, plans, or attempt; a previous suicide attempt by you or a family member -an unusual or allergic reaction to bupropion, naltrexone, other medicines, foods, dyes, or preservatives breast-feeding -pregnant or trying to become pregnant How should I use this medicine? Take this medicine by mouth with a glass of water. Follow the directions on the prescription label. Take this medicine in the morning and in the evenings as directed by your healthcare professional. Bonita QuinYou can take it with or without food. Do not take with high-fat meals as this may increase your risk of seizures. Do not crush, chew, or cut these tablets. Do not take your medicine more often than directed. Do not stop taking this medicine suddenly except upon the  advice of your doctor. A special MedGuide will be given to you by the pharmacist with each prescription and refill. Be sure to read this information carefully each time. Talk to your pediatrician regarding the use of this medicine in children. Special care may be needed. Overdosage: If you think you have taken too much of this medicine contact a poison control center or emergency room at once. NOTE: This medicine is only for you. Do not share this medicine with others. What if I miss a dose? If you miss a dose, skip the missed dose and take your next tablet at the regular time. Do not take double or extra doses. What may interact with this medicine? Do not take this medicine with any of the following medications: -any prescription or street opioid drug like codiene, heroin, methadone -linezolid -MAOIs like Carbex, Eldepryl, Marplan, Nardil, and Parnate -methylene blue (injected into a vein) -other medicines that contain bupropion like Zyban or Wellbutrin This medicine may also interact with the following medications: -alcohol -certain medicines for anxiety or sleep -certain medicines for blood pressure like metoprolol, propranolol -certain medicines for depression or psychotic disturbances -certain medicines for HIV or AIDS like efavirenz, lopinavir, nelfinavir, ritonavir -certain medicines for irregular heart beat like propafenone, flecainide -certain medicines for Parkinson's disease like amantadine, levodopa -certain medicines for seizures like carbamazepine, phenytoin, phenobarbital -cimetidine -clopidogrel -cyclophosphamide -disulfiram -furazolidone -isoniazid -nicotine -orphenadrine -procarbazine -steroid medicines like prednisone or cortisone -stimulant medicines for attention disorders, weight loss, or to stay awake -tamoxifen -theophylline -thioridazine -thiotepa -ticlopidine -tramadol -warfarin This list may not describe all possible interactions. Give your health  care provider  a list of all the medicines, herbs, non-prescription drugs, or dietary supplements you use. Also tell them if you smoke, drink alcohol, or use illegal drugs. Some items may interact with your medicine. What should I watch for while using this medicine? This medicine is intended to be used in addition to a healthy diet and appropriate exercise. The best results are achieved this way. Do not increase or in any way change your dose without consulting your doctor or health care professional. Do not take this medicine with other prescription or over-the-counter weight loss products without consulting your doctor or health care professional. Your doctor should tell you to stop taking this medicine if you do not lose a certain amount of weight within the first 12 weeks of treatment. Visit your doctor or health care professional for regular checkups. Your doctor may order blood tests or other tests to see how you are doing. This medicine may affect blood sugar levels. If you have diabetes, check with your doctor or health care professional before you change your diet or the dose of your diabetic medicine. Patients and their families should watch out for new or worsening depression or thoughts of suicide. Also watch out for sudden changes in feelings such as feeling anxious, agitated, panicky, irritable, hostile, aggressive, impulsive, severely restless, overly excited and hyperactive, or not being able to sleep. If this happens, especially at the beginning of treatment or after a change in dose, call your health care professional. Avoid alcoholic drinks while taking this medicine. Drinking large amounts of alcoholic beverages, using sleeping or anxiety medicines, or quickly stopping the use of these agents while taking this medicine may increase your risk for a seizure. What side effects may I notice from receiving this medicine? Side effects that you should report to your doctor or health care  professional as soon as possible: -allergic reactions like skin rash, itching or hives, swelling of the face, lips, or tongue -breathing problems -changes in vision, hearing -chest pain -confusion -dark urine -depressed mood -fast or irregular heart beat -fever -hallucination, loss of contact with reality -increased blood pressure -light-colored stools -redness, blistering, peeling or loosening of the skin, including inside the mouth -right upper belly pain -seizures -suicidal thoughts or other mood changes -unusually weak or tired -vomiting -yellowing of the eyes or skin Side effects that usually do not require medical attention (Report these to your doctor or health care professional if they continue or are bothersome.): -constipation -diarrhea -dizziness -dry mouth -headache -nausea -trouble sleeping This list may not describe all possible side effects. Call your doctor for medical advice about side effects. You may report side effects to FDA at 1-800-FDA-1088. Where should I keep my medicine? Keep out of the reach of children. Store at room temperature between 15 and 30 degrees C (59 and 86 degrees F). Throw away any unused medicine after the expiration date. NOTE: This sheet is a summary. It may not cover all possible information. If you have questions about this medicine, talk to your doctor, pharmacist, or health care provider.    2016, [employer] Standard. (2013-06-02 15:17:29)

## 2016-01-24 NOTE — Progress Notes (Signed)
Pre-visit discussion using our clinic review tool. No additional management support is needed unless otherwise documented below in the visit note.  

## 2016-01-24 NOTE — Progress Notes (Signed)
Subjective:  Patient ID: [name], male    DOB: 01-10-1976  Age: 40 y.o. MRN: [id number]  CC: The primary encounter diagnosis was Diabetes mellitus without complication (HCC). Diagnoses of Hyperlipidemia, Obesity (BMI 30-39.9), Essential hypertension, Generalized anxiety disorder, Diabetes mellitus type 2, diet-controlled (HCC), and Dyslipidemia (high LDL; low HDL) were also pertinent to this visit.  HPI [facility] presents for diabetes follow up,  Last seen in late October,  Lost his insurance for a while,  Has been unable to afford his medications without insurance, left [employer] and now working a less strenuous .and has gained 16 lbs since February when he was seen in Urgent Care for flu like symptoms with negative flue test.  tamiflu given unot taken  .  felt bad for 4 days.   Exercising an average of 3 times weekly.  Has resumed a low carb diet. Losing a little bit of weight but requesting help,  Has done resaerach on Cotrave and would like to try the medication for a few months     Missed a few doses of losartan this week  BP up.  Has not resumed simvastatin ate a fwe corn chips,  No lunch   Sleeping well without Lunesta , but having daytime anxiety and wants to resume Paxil and alprazolam   Lab Results  Component Value Date   HGBA1C 6.9* 01/24/2016     Outpatient Prescriptions Prior to Visit  Medication Sig Dispense Refill  . losartan (COZAAR) 100 MG tablet Take 1 tablet (100 mg total) by mouth daily. 90 tablet 3  . omeprazole (PRILOSEC) 40 MG capsule Take 1 capsule (40 mg total) by mouth daily. 90 capsule 3  . ibuprofen (ADVIL,MOTRIN) 800 MG tablet Take 1 tablet (800 mg total) by mouth every 6 (six) hours as needed. (Patient not taking: Reported on 01/24/2016) 30 tablet 0  . ALPRAZolam (XANAX) 0.5 MG tablet Take 1 tablet (0.5 mg total) by mouth daily as needed for anxiety. (Patient not taking: Reported on 01/24/2016) 30 tablet 5  . eszopiclone (LUNESTA)  2 MG TABS tablet Take 1 tablet (2 mg total) by mouth at bedtime. Take immediately before bedtime (Patient not taking: Reported on 01/24/2016) 90 tablet 1  . mometasone (NASONEX) 50 MCG/ACT nasal spray Place 2 sprays into the nose daily. (Patient not taking: Reported on 01/24/2016) 17 g 0  . oseltamivir (TAMIFLU) 75 MG capsule Take 1 capsule (75 mg total) by mouth every 12 (twelve) hours. 10 capsule 0  . PARoxetine (PAXIL) 20 MG tablet TAKE 1 TABLET BY MOUTH EVERY DAY (Patient not taking: Reported on 01/24/2016) 30 tablet 2  . sertraline (ZOLOFT) 100 MG tablet Take 1 tablet (100 mg total) by mouth daily. (Patient not taking: Reported on 01/24/2016) 90 tablet 1  . simvastatin (ZOCOR) 40 MG tablet Take 1 tablet (40 mg total) by mouth at bedtime. (Patient not taking: Reported on 01/24/2016) 90 tablet 3  . tadalafil (CIALIS) 20 MG tablet Take 1 tablet (20 mg total) by mouth daily as needed for erectile dysfunction. (Patient not taking: Reported on 01/24/2016) 6 tablet 12   No facility-administered medications prior to visit.    Review of Systems;  Patient denies headache, fevers, malaise, unintentional weight loss, skin rash, eye pain, sinus congestion and sinus pain, sore throat, dysphagia,  hemoptysis , cough, dyspnea, wheezing, chest pain, palpitations, orthopnea, edema, abdominal pain, nausea, melena, diarrhea, constipation, flank pain, dysuria, hematuria, urinary  Frequency, nocturia, numbness, tingling, seizures,  Focal weakness, Loss of consciousness,  Tremor, insomnia, depression, anxiety, and suicidal ideation.      Objective:  BP 130/102 mmHg  Pulse 87  Temp(Src) 98.4 F (36.9 C) (Oral)  Resp 12  Ht 6' (1.829 m)  Wt 291 lb 12 oz (132.337 kg)  BMI 39.56 kg/m2  SpO2 98%  BP Readings from Last 3 Encounters:  01/24/16 130/102  10/17/15 135/89  08/28/15 126/85    Wt Readings from Last 3 Encounters:  01/24/16 291 lb 12 oz (132.337 kg)  10/17/15 275 lb (124.739 kg)  08/28/15 273 lb  (123.832 kg)    General appearance: alert, cooperative and appears stated age Ears: normal TM's and external ear canals both ears Throat: lips, mucosa, and tongue normal; teeth and gums normal Neck: no adenopathy, no carotid bruit, supple, symmetrical, trachea midline and thyroid not enlarged, symmetric, no tenderness/mass/nodules Back: symmetric, no curvature. ROM normal. No CVA tenderness. Lungs: clear to auscultation bilaterally Heart: regular rate and rhythm, S1, S2 normal, no murmur, click, rub or gallop Abdomen: soft, non-tender; bowel sounds normal; no masses,  no organomegaly Pulses: 2+ and symmetric Skin: Skin color, texture, turgor normal. No rashes or lesions Lymph nodes: Cervical, supraclavicular, and axillary nodes normal.  Lab Results  Component Value Date   HGBA1C 6.9* 01/24/2016   HGBA1C 6.0 07/05/2015   HGBA1C 6.3 09/28/2014    Lab Results  Component Value Date   CREATININE 0.90 01/24/2016   CREATININE 0.86 07/05/2015   CREATININE 0.97 01/12/2015    Lab Results  Component Value Date   WBC 8.9 06/18/2013   HGB 14.1 06/18/2013   HCT 41.4 06/18/2013   PLT 198.0 06/18/2013   GLUCOSE 136* 01/24/2016   CHOL 211* 01/24/2016   TRIG * 01/24/2016    421.0 Triglyceride is over 400; calculations on Lipids are invalid.   HDL 27.70* 01/24/2016   LDLDIRECT 125.0 01/24/2016   LDLCALC 137* 07/05/2015   ALT 30 01/24/2016   AST 24 01/24/2016   NA 139 01/24/2016   K 3.7 01/24/2016   CL 104 01/24/2016   CREATININE 0.90 01/24/2016   BUN 12 01/24/2016   CO2 25 01/24/2016   TSH 1.65 07/31/2012   HGBA1C 6.9* 01/24/2016   MICROALBUR 2.1* 01/24/2016    No results found.  Assessment & Plan:   Problem List Items Addressed This Visit    Obesity (BMI 30-39.9)    He has regained much of the weight he lost last year.  I have addressed  BMI and recommended wt loss of 10% of body weight over the next 6 months using a low glycemic index diet and regular exercise a minimum of  5 days per week.   Patient requesting a trial of contrave.         Relevant Medications   Naltrexone-Bupropion HCl ER 8-90 MG TB12   Hypertension    Elevated due to lapse in medication.  Resume losartan      Relevant Medications   simvastatin (ZOCOR) 40 MG tablet   Dyslipidemia (high LDL; low HDL)    Trigs are elvated since suspending LFTS normal. Resume statin and repeat in 3 months   Lab Results  Component Value Date   CHOL 211* 01/24/2016   HDL 27.70* 01/24/2016   LDLCALC 137* 07/05/2015   LDLDIRECT 125.0 01/24/2016   TRIG * 01/24/2016    421.0 Triglyceride is over 400; calculations on Lipids are invalid.   CHOLHDL 8 01/24/2016    Lab Results  Component Value Date   ALT 30 01/24/2016   AST 24  01/24/2016   ALKPHOS 54 01/24/2016   BILITOT 0.3 01/24/2016            Relevant Medications   simvastatin (ZOCOR) 40 MG tablet   Diabetes mellitus type 2, diet-controlled (HCC)    Historically well-controlled on current medications.  hemoglobin A1c has been consistently at or  less than 7.0 . Patient is up-to-date on eye exams and foot exam is normal today. Patient has proteinuria and will resume ARB.   Patient is tolerating statin therapy for CAD risk reduction  Lab Results  Component Value Date   HGBA1C 6.9* 01/24/2016   Lab Results  Component Value Date   MICROALBUR 2.1* 01/24/2016         Relevant Medications   simvastatin (ZOCOR) 40 MG tablet   Generalized anxiety disorder    Advised to resume paxil at 10 mg and increase to 20 mg after 2 weeks       Other Visit Diagnoses    Diabetes mellitus without complication (HCC)    -  Primary    Relevant Medications    simvastatin (ZOCOR) 40 MG tablet    Other Relevant Orders    Comprehensive metabolic panel (Completed)    Hemoglobin A1c (Completed)    Lipid panel (Completed)    Microalbumin / creatinine urine ratio (Completed)    Hyperlipidemia        Relevant Medications    simvastatin (ZOCOR) 40 MG  tablet    Other Relevant Orders    LDL cholesterol, direct (Completed)       I have discontinued Mr. Rahal's tadalafil, eszopiclone, sertraline, mometasone, and oseltamivir. I have also changed his PARoxetine. Additionally, I am having him start on Naltrexone-Bupropion HCl ER. Lastly, I am having him maintain his omeprazole, losartan, ibuprofen, fexofenadine, ALPRAZolam, and simvastatin.  Meds ordered this encounter  Medications  . fexofenadine (ALLEGRA) 180 MG tablet    Sig: Take 180 mg by mouth daily.  Marland Kitchen. PARoxetine (PAXIL) 10 MG tablet    Sig: One tablet daily    Dispense:  30 tablet    Refill:  2    Increase to 20 mg Month #2  . ALPRAZolam (XANAX) 0.5 MG tablet    Sig: Take 1 tablet (0.5 mg total) by mouth daily as needed for anxiety.    Dispense:  30 tablet    Refill:  2  . Naltrexone-Bupropion HCl ER 8-90 MG TB12    Sig: One tablet every morning for one week, then twice daily for one week.. Increase gradually to 2 tablets twice daily    Dispense:  120 tablet    Refill:  0  . simvastatin (ZOCOR) 40 MG tablet    Sig: Take 1 tablet (40 mg total) by mouth at bedtime.    Dispense:  90 tablet    Refill:  3    Medications Discontinued During This Encounter  Medication Reason  . oseltamivir (TAMIFLU) 75 MG capsule Error  . PARoxetine (PAXIL) 20 MG tablet Reorder  . ALPRAZolam (XANAX) 0.5 MG tablet Reorder  . eszopiclone (LUNESTA) 2 MG TABS tablet   . sertraline (ZOLOFT) 100 MG tablet   . tadalafil (CIALIS) 20 MG tablet   . simvastatin (ZOCOR) 40 MG tablet Reorder  . mometasone (NASONEX) 50 MCG/ACT nasal spray     Follow-up: No Follow-up on file.   Sherlene ShamsULLO, TERESA L, MD

## 2016-01-25 LAB — LIPID PANEL
CHOLESTEROL: 211 mg/dL — AB (ref 0–200)
HDL: 27.7 mg/dL — ABNORMAL LOW (ref >39.00)
Total CHOL/HDL Ratio: 8
Triglycerides: 421 mg/dL — ABNORMAL HIGH (ref 0.0–149.0)

## 2016-01-25 LAB — COMPREHENSIVE METABOLIC PANEL
ALBUMIN: 4.1 g/dL (ref 3.5–5.2)
ALK PHOS: 54 U/L (ref 39–117)
ALT: 30 U/L (ref 0–53)
AST: 24 U/L (ref 0–37)
BILIRUBIN TOTAL: 0.3 mg/dL (ref 0.2–1.2)
BUN: 12 mg/dL (ref 6–23)
CO2: 25 mEq/L (ref 19–32)
Calcium: 9.3 mg/dL (ref 8.4–10.5)
Chloride: 104 mEq/L (ref 96–112)
Creatinine, Ser: 0.9 mg/dL (ref 0.40–1.50)
GFR: 98.31 mL/min (ref >60.00)
GLUCOSE: 136 mg/dL — AB (ref 70–99)
Potassium: 3.7 mEq/L (ref 3.5–5.1)
Sodium: 139 mEq/L (ref 135–145)
TOTAL PROTEIN: 7.4 g/dL (ref 6.0–8.3)

## 2016-01-25 LAB — MICROALBUMIN / CREATININE URINE RATIO
CREATININE, U: 165.5 mg/dL
MICROALB UR: 2.1 mg/dL — AB (ref 0.0–1.9)
Microalb Creat Ratio: 1.3 mg/g (ref 0.0–30.0)

## 2016-01-25 LAB — LDL CHOLESTEROL, DIRECT: Direct LDL: 125 mg/dL

## 2016-01-25 LAB — HEMOGLOBIN A1C: HEMOGLOBIN A1C: 6.9 % — AB (ref 4.6–6.5)

## 2016-01-26 MED ORDER — SIMVASTATIN 40 MG PO TABS: 40.0000 mg | ORAL_TABLET | Freq: Every day | ORAL | Status: DC

## 2016-01-26 NOTE — Assessment & Plan Note (Signed)
Historically well-controlled on current medications.  hemoglobin A1c has been consistently at or  less than 7.0 . Patient is up-to-date on eye exams and foot exam is normal today. Patient has proteinuria and will resume ARB.   Patient is tolerating statin therapy for CAD risk reduction  Lab Results  Component Value Date   HGBA1C 6.9* 01/24/2016   Lab Results  Component Value Date   MICROALBUR 2.1* 01/24/2016

## 2016-01-26 NOTE — Assessment & Plan Note (Addendum)
He has regained much of the weight he lost last year.  I have addressed  BMI and recommended wt loss of 10% of body weight over the next 6 months using a low glycemic index diet and regular exercise a minimum of 5 days per week.   Patient requesting a trial of contrave.

## 2016-01-26 NOTE — Assessment & Plan Note (Signed)
Trigs are elvated since suspending LFTS normal. Resume statin and repeat in 3 months   Lab Results  Component Value Date   CHOL 211* 01/24/2016   HDL 27.70* 01/24/2016   LDLCALC 137* 07/05/2015   LDLDIRECT 125.0 01/24/2016   TRIG * 01/24/2016    421.0 Triglyceride is over 400; calculations on Lipids are invalid.   CHOLHDL 8 01/24/2016    Lab Results  Component Value Date   ALT 30 01/24/2016   AST 24 01/24/2016   ALKPHOS 54 01/24/2016   BILITOT 0.3 01/24/2016

## 2016-01-26 NOTE — Assessment & Plan Note (Signed)
Advised to resume paxil at 10 mg and increase to 20 mg after 2 weeks

## 2016-01-26 NOTE — Assessment & Plan Note (Signed)
Elevated due to lapse in medication.  Resume losartan

## 2016-01-27 ENCOUNTER — Encounter: Payer: Self-pay | Admitting: Internal Medicine

## 2016-02-14 ENCOUNTER — Telehealth: Payer: Self-pay | Admitting: *Deleted

## 2016-02-14 NOTE — Telephone Encounter (Signed)
Patient has requested to have a prior authorization for Contrabe He stated that this will be a new [facility] [place]

## 2016-02-23 NOTE — Telephone Encounter (Signed)
Prior authorization started for Contrave through Cover My Meds.

## 2016-02-26 ENCOUNTER — Telehealth: Payer: Self-pay | Admitting: Internal Medicine

## 2016-02-26 MED ORDER — LORCASERIN HCL 10 MG PO TABS: 1.0000 | ORAL_TABLET | Freq: Two times a day (BID) | ORAL | Status: DC

## 2016-02-26 NOTE — Telephone Encounter (Signed)
Mark Welch [phone] [id number] called from [employer] regarding Contrave 8-90mg  was denied notifying due to the type of plan the pt has. On the denial letter it does list other options.   Thank you!

## 2016-02-26 NOTE — Telephone Encounter (Signed)
Please let the patient know that either acceptable medication is recommended,  Belviq or Qsymia (phentermine/topiramate combination).  If he has not preference, i will choose.

## 2016-02-26 NOTE — Telephone Encounter (Signed)
Patient needs to have tried two of the plan's alternatives: Belviq or Qsymia? Please advise. thanks

## 2016-02-26 NOTE — Telephone Encounter (Signed)
Patient stated he will leave to MD choice.

## 2016-02-27 ENCOUNTER — Telehealth: Payer: Self-pay

## 2016-02-27 NOTE — Telephone Encounter (Signed)
Lorcaserin rx faxed to [facility].

## 2016-03-27 ENCOUNTER — Encounter: Payer: Self-pay | Admitting: Family Medicine

## 2016-03-27 ENCOUNTER — Ambulatory Visit (INDEPENDENT_AMBULATORY_CARE_PROVIDER_SITE_OTHER): Payer: BLUE CROSS/BLUE SHIELD | Admitting: Family Medicine

## 2016-03-27 DIAGNOSIS — M545 Low back pain, unspecified: Secondary | ICD-10-CM

## 2016-03-27 LAB — POCT URINALYSIS DIPSTICK
BILIRUBIN UA: NEGATIVE
GLUCOSE UA: 500
KETONES UA: NEGATIVE
Leukocytes, UA: NEGATIVE
Nitrite, UA: NEGATIVE
PH UA: 5.5
Protein, UA: NEGATIVE
Spec Grav, UA: 1.025
Urobilinogen, UA: 0.2

## 2016-03-27 MED ORDER — CYCLOBENZAPRINE HCL 10 MG PO TABS: 10.0000 mg | ORAL_TABLET | Freq: Three times a day (TID) | ORAL | Status: DC | PRN

## 2016-03-27 MED ORDER — TRAMADOL HCL 50 MG PO TABS: 50.0000 mg | ORAL_TABLET | Freq: Three times a day (TID) | ORAL | Status: DC | PRN

## 2016-03-27 NOTE — Progress Notes (Signed)
Subjective:  Patient ID: Mark Welch, male    DOB: [birth year]  Age: 40 y.o. MRN: [id number]  CC: Back pain  HPI:  40 year old male with a past medical history of hypertension, obesity, hyper lipidemia, DM 2 presents with the above complaint.  Patient states that 1.5 weeks ago he developed right low back pain after moving a heavy object. He states that he hydrated aggressively and took some Advil and the pain resolved. He states that he was doing well until yesterday when the pain recurred. Pain is located in the right low back/right flank. Pain is currently 8-9/10 in severity. He states that it feels like a "spasm". He's taken Tylenol and Advil today without resolution. Pain is constant. Worse with certain activity/motions. No relieving factors. No associated hematuria, dysuria, urgency, frequency. No other complaints at this time.  Social Hx   Social History   Social History  . Marital Status: Single    Spouse Name: N/A  . Number of Children: N/A  . Years of Education: N/A   Social History Main Topics  . Smoking status: Never Smoker   . Smokeless tobacco: Never Used  . Alcohol Use: No  . Drug Use: No  . Sexual Activity: Yes   Other Topics Concern  . None   Social History Narrative   Review of Systems  Constitutional: Negative.   Genitourinary: Negative for dysuria, urgency and frequency.  Musculoskeletal: Positive for back pain.   Objective:  BP 114/70 mmHg  Pulse 74  Temp(Src) 98.3 F (36.8 C) (Oral)  Ht 6' (1.829 m)  Wt 290 lb (131.543 kg)  BMI 39.32 kg/m2  SpO2 98%  BP/Weight 03/27/2016 01/24/2016 10/17/2015  Systolic BP 114 130 135  Diastolic BP 70 102 89  Wt. (Lbs) 290 291.75 275  BMI 39.32 39.56 37.29   Physical Exam  Constitutional: He is oriented to person, place, and time. He appears well-developed. No distress.  Cardiovascular: Normal rate and regular rhythm.   Pulmonary/Chest: Effort normal. He has no wheezes. He has no rales.  Abdominal:  Soft. He exhibits no distension. There is no tenderness.  No CVA tenderness.   Musculoskeletal:  Lumbar spine/Thoracic spine - no discrete areas of tenderness.  Neurological: He is alert and oriented to person, place, and time.  Psychiatric: He has a normal mood and affect.  Vitals reviewed.  Lab Results  Component Value Date   WBC 8.9 06/18/2013   HGB 14.1 06/18/2013   HCT 41.4 06/18/2013   PLT 198.0 06/18/2013   GLUCOSE 136* 01/24/2016   CHOL 211* 01/24/2016   TRIG * 01/24/2016    421.0 Triglyceride is over 400; calculations on Lipids are invalid.   HDL 27.70* 01/24/2016   LDLDIRECT 125.0 01/24/2016   LDLCALC 137* 07/05/2015   ALT 30 01/24/2016   AST 24 01/24/2016   NA 139 01/24/2016   K 3.7 01/24/2016   CL 104 01/24/2016   CREATININE 0.90 01/24/2016   BUN 12 01/24/2016   CO2 25 01/24/2016   TSH 1.65 07/31/2012   HGBA1C 6.9* 01/24/2016   MICROALBUR 2.1* 01/24/2016    Assessment & Plan:   Problem List Items Addressed This Visit    Right-sided low back pain without sciatica    New problem. Appears MSK in nature. UA with trace blood. Does not appear to be consistent with stone. Treating with tramadol and flexeril.      Relevant Medications   traMADol (ULTRAM) 50 MG tablet   cyclobenzaprine (FLEXERIL) 10 MG tablet  Other Relevant Orders   POCT Urinalysis Dipstick (Completed)      Meds ordered this encounter  Medications  . traMADol (ULTRAM) 50 MG tablet    Sig: Take 1 tablet (50 mg total) by mouth every 8 (eight) hours as needed.    Dispense:  30 tablet    Refill:  0  . cyclobenzaprine (FLEXERIL) 10 MG tablet    Sig: Take 1 tablet (10 mg total) by mouth 3 (three) times daily as needed for muscle spasms.    Dispense:  30 tablet    Refill:  0   Follow-up: PRN  Everlene Other DO [facility]

## 2016-03-27 NOTE — Patient Instructions (Signed)
This appears to be muscular in nature.  Take the medications as prescribed.  If it worsens or fails to improve please let us know.  Take care  Dr. Adriana Simasook

## 2016-03-27 NOTE — Progress Notes (Signed)
Pre visit review using our clinic review tool, if applicable. No additional management support is needed unless otherwise documented below in the visit note. 

## 2016-03-27 NOTE — Assessment & Plan Note (Signed)
New problem. Appears MSK in nature. UA with trace blood. Does not appear to be consistent with stone. Treating with tramadol and flexeril.

## 2016-04-15 ENCOUNTER — Encounter: Payer: Self-pay | Admitting: Family Medicine

## 2016-04-15 ENCOUNTER — Ambulatory Visit (INDEPENDENT_AMBULATORY_CARE_PROVIDER_SITE_OTHER): Payer: BLUE CROSS/BLUE SHIELD | Admitting: Family Medicine

## 2016-04-15 VITALS — BP 118/80 | HR 81 | Temp 98.4°F | Wt 283.6 lb

## 2016-04-15 DIAGNOSIS — R197 Diarrhea, unspecified: Secondary | ICD-10-CM

## 2016-04-15 LAB — CBC
HEMATOCRIT: 37.8 % — AB (ref 39.0–52.0)
Hemoglobin: 13 g/dL (ref 13.0–17.0)
MCHC: 34.3 g/dL (ref 30.0–36.0)
MCV: 85.9 fl (ref 78.0–100.0)
PLATELETS: 200 10*3/uL (ref 150.0–400.0)
RBC: 4.4 Mil/uL (ref 4.22–5.81)
RDW: 13.7 % (ref 11.5–15.5)
WBC: 9.1 10*3/uL (ref 4.0–10.5)

## 2016-04-15 LAB — COMPREHENSIVE METABOLIC PANEL
ALK PHOS: 42 U/L (ref 39–117)
ALT: 31 U/L (ref 0–53)
AST: 27 U/L (ref 0–37)
Albumin: 4.1 g/dL (ref 3.5–5.2)
BUN: 17 mg/dL (ref 6–23)
CO2: 26 mEq/L (ref 19–32)
Calcium: 9.4 mg/dL (ref 8.4–10.5)
Chloride: 103 mEq/L (ref 96–112)
Creatinine, Ser: 0.91 mg/dL (ref 0.40–1.50)
GFR: 96.96 mL/min (ref >60.00)
GLUCOSE: 136 mg/dL — AB (ref 70–99)
POTASSIUM: 3.4 meq/L — AB (ref 3.5–5.1)
Sodium: 138 mEq/L (ref 135–145)
TOTAL PROTEIN: 7.7 g/dL (ref 6.0–8.3)
Total Bilirubin: 0.6 mg/dL (ref 0.2–1.2)

## 2016-04-15 NOTE — Assessment & Plan Note (Signed)
Patient with several weeks of diarrhea, abdominal discomfort, and some nausea. Has slowly gotten better over the last week. Not quite resolved. Has associated mild dull frontal headache as well with this. Symptoms could be related to viral illness, bacterial illness, less likely food borne illness, or food intolerance. Benign abdominal exam. Neurologically intact. Headache could be related to dehydration or caffeine deficit. Discussed checking lab work to evaluate for dehydration and infection. If white blood cell count is elevated we will obtain stool studies. If symptoms persist through the middle of this week we will perform stool studies. He will stay well hydrated. Given return precautions.

## 2016-04-15 NOTE — Progress Notes (Signed)
Pre visit review using our clinic review tool, if applicable. No additional management support is needed unless otherwise documented below in the visit note. 

## 2016-04-15 NOTE — Progress Notes (Signed)
  Mark Rumps, MD Phone: [phone]  Mark Welch is a 40 y.o. male who presents today for same-day visit.  Patient notes symptoms over the last 2 weeks. Notes some abdominal discomfort, nausea, and diarrhea. Abdominal discomfort is mostly associated with his diarrhea. At first he was having diarrhea about every hour though this is much less frequent now. Has starting to have more solid stools though they're still loose. Last loose bowel movement was yesterday. This was followed 10 minutes later by liquid stool. He had pain in his stomach with the liquid stool that he had after this. No blood in his stool. No focal abdominal discomfort. Notes some associated headaches with all this as well. Frontal and bitemporal dull aching headache. Gradual in onset. Feels similar to his caffeine withdrawal headaches in the past though he is drinking caffeine. No numbness, weakness, or vision changes. No fevers. No recent travel. No dietary changes. Not drinking out of any creeks. No sick contacts.   ROS see history of present illness  Objective  Physical Exam Vitals:   04/15/16 0801  BP: 118/80  Pulse: 81  Temp: 98.4 F (36.9 C)    BP Readings from Last 3 Encounters:  04/15/16 118/80  03/27/16 114/70  01/24/16 (!) 130/102   Wt Readings from Last 3 Encounters:  04/15/16 283 lb 9.6 oz (128.6 kg)  03/27/16 290 lb (131.5 kg)  01/24/16 291 lb 12 oz (132.3 kg)    Physical Exam  Constitutional: No distress.  HENT:  Head: Normocephalic and atraumatic.  Mouth/Throat: Oropharynx is clear and moist. No oropharyngeal exudate.  Eyes: Conjunctivae are normal. Pupils are equal, round, and reactive to light.  Cardiovascular: Normal rate and regular rhythm.   Pulmonary/Chest: Effort normal and breath sounds normal.  Abdominal: Soft. Bowel sounds are normal. He exhibits no distension. There is no tenderness. There is no rebound and no guarding.  Musculoskeletal: He exhibits no edema.    Neurological: He is alert.  CN 2-12 intact, 5/5 strength in bilateral biceps, triceps, grip, quads, hamstrings, plantar and dorsiflexion, sensation to light touch intact in bilateral UE and LE, normal gait, 2+ patellar reflexes  Skin: Skin is warm and dry. He is not diaphoretic.     Assessment/Plan: Please see individual problem list.  Diarrhea Patient with several weeks of diarrhea, abdominal discomfort, and some nausea. Has slowly gotten better over the last week. Not quite resolved. Has associated mild dull frontal headache as well with this. Symptoms could be related to viral illness, bacterial illness, less likely food borne illness, or food intolerance. Benign abdominal exam. Neurologically intact. Headache could be related to dehydration or caffeine deficit. Discussed checking lab work to evaluate for dehydration and infection. If white blood cell count is elevated we will obtain stool studies. If symptoms persist through the middle of this week we will perform stool studies. He will stay well hydrated. Given return precautions.   Orders Placed This Encounter  Procedures  . CBC  . Comp Met (CMET)    No orders of the defined types were placed in this encounter.   Mark Rumps, MD [place]

## 2016-04-15 NOTE — Patient Instructions (Signed)
Nice to meet you. Your symptoms could be related to food intolerance, viral illness, or bacterial illness. We will check some lab work to ensure that you're well hydrated and that your electrolytes are stable. We will check a CBC to evaluate for signs of infection. If your symptoms persist we will do stool studies. If you develop worsening abdominal pain, bloody stool, fevers, worsening headache, numbness, weakness, vision changes, or any new or changing symptoms please seek medical attention.

## 2016-06-03 ENCOUNTER — Other Ambulatory Visit: Payer: Self-pay | Admitting: Internal Medicine

## 2016-07-15 LAB — HM DIABETES EYE EXAM

## 2016-08-17 LAB — HM DIABETES EYE EXAM

## 2016-09-17 ENCOUNTER — Encounter: Payer: Self-pay | Admitting: Internal Medicine

## 2016-10-02 ENCOUNTER — Ambulatory Visit (INDEPENDENT_AMBULATORY_CARE_PROVIDER_SITE_OTHER): Payer: BLUE CROSS/BLUE SHIELD | Admitting: Internal Medicine

## 2016-10-02 ENCOUNTER — Encounter: Payer: Self-pay | Admitting: Internal Medicine

## 2016-10-02 VITALS — BP 136/90 | HR 73 | Temp 98.2°F | Resp 17 | Ht 72.0 in | Wt 287.0 lb

## 2016-10-02 DIAGNOSIS — R519 Headache, unspecified: Secondary | ICD-10-CM

## 2016-10-02 DIAGNOSIS — I10 Essential (primary) hypertension: Secondary | ICD-10-CM | POA: Diagnosis not present

## 2016-10-02 DIAGNOSIS — F411 Generalized anxiety disorder: Secondary | ICD-10-CM

## 2016-10-02 DIAGNOSIS — E785 Hyperlipidemia, unspecified: Secondary | ICD-10-CM

## 2016-10-02 DIAGNOSIS — IMO0001 Reserved for inherently not codable concepts without codable children: Secondary | ICD-10-CM

## 2016-10-02 DIAGNOSIS — R51 Headache: Secondary | ICD-10-CM

## 2016-10-02 DIAGNOSIS — F419 Anxiety disorder, unspecified: Secondary | ICD-10-CM

## 2016-10-02 DIAGNOSIS — F5105 Insomnia due to other mental disorder: Secondary | ICD-10-CM

## 2016-10-02 DIAGNOSIS — E669 Obesity, unspecified: Secondary | ICD-10-CM

## 2016-10-02 DIAGNOSIS — E119 Type 2 diabetes mellitus without complications: Secondary | ICD-10-CM | POA: Diagnosis not present

## 2016-10-02 DIAGNOSIS — Z6838 Body mass index (BMI) 38.0-38.9, adult: Secondary | ICD-10-CM

## 2016-10-02 DIAGNOSIS — Z23 Encounter for immunization: Secondary | ICD-10-CM | POA: Diagnosis not present

## 2016-10-02 DIAGNOSIS — R0683 Snoring: Secondary | ICD-10-CM

## 2016-10-02 DIAGNOSIS — E784 Other hyperlipidemia: Secondary | ICD-10-CM

## 2016-10-02 DIAGNOSIS — E6609 Other obesity due to excess calories: Secondary | ICD-10-CM

## 2016-10-02 LAB — COMPREHENSIVE METABOLIC PANEL
ALK PHOS: 43 U/L (ref 39–117)
ALT: 32 U/L (ref 0–53)
AST: 24 U/L (ref 0–37)
Albumin: 4.2 g/dL (ref 3.5–5.2)
BILIRUBIN TOTAL: 0.5 mg/dL (ref 0.2–1.2)
BUN: 16 mg/dL (ref 6–23)
CO2: 30 meq/L (ref 19–32)
Calcium: 9.2 mg/dL (ref 8.4–10.5)
Chloride: 103 mEq/L (ref 96–112)
Creatinine, Ser: 0.91 mg/dL (ref 0.40–1.50)
GFR: 96.75 mL/min (ref >60.00)
GLUCOSE: 88 mg/dL (ref 70–99)
Potassium: 3.6 mEq/L (ref 3.5–5.1)
SODIUM: 140 meq/L (ref 135–145)
TOTAL PROTEIN: 7.5 g/dL (ref 6.0–8.3)

## 2016-10-02 LAB — MICROALBUMIN / CREATININE URINE RATIO
CREATININE, U: 125.7 mg/dL
MICROALB/CREAT RATIO: 1.1 mg/g (ref 0.0–30.0)
Microalb, Ur: 1.4 mg/dL (ref 0.0–1.9)

## 2016-10-02 LAB — LIPID PANEL
CHOL/HDL RATIO: 6
CHOLESTEROL: 207 mg/dL — AB (ref 0–200)
HDL: 34.2 mg/dL — ABNORMAL LOW (ref >39.00)
NONHDL: 172.83
Triglycerides: 236 mg/dL — ABNORMAL HIGH (ref 0.0–149.0)
VLDL: 47.2 mg/dL — AB (ref 0.0–40.0)

## 2016-10-02 LAB — HEMOGLOBIN A1C: Hgb A1c MFr Bld: 6.8 % — ABNORMAL HIGH (ref 4.6–6.5)

## 2016-10-02 LAB — LDL CHOLESTEROL, DIRECT: Direct LDL: 143 mg/dL

## 2016-10-02 MED ORDER — PAROXETINE HCL 10 MG PO TABS: 10.0000 mg | ORAL_TABLET | Freq: Every day | ORAL | 1 refills | Status: DC

## 2016-10-02 MED ORDER — ASPIRIN EC 81 MG PO TBEC: 81.0000 mg | DELAYED_RELEASE_TABLET | Freq: Every day | ORAL | Status: DC

## 2016-10-02 NOTE — Progress Notes (Signed)
Pre-visit discussion using our clinic review tool. No additional management support is needed unless otherwise documented below in the visit note.  

## 2016-10-02 NOTE — Patient Instructions (Signed)
A SLEEP STUDY WILL BE ORDERED  Start taking a baby aspirin (low dose, 81 mg ) daily TO PREVENT HEART ATTACKS   If your cholesterol is high,  I will recommend that you start taking simvastatin  Your blood pressure is elevated.  This may improve if we find sleep apnea and treat it.     Sleep Apnea Sleep apnea is a condition in which breathing pauses or becomes shallow during sleep. Episodes of sleep apnea usually last 10 seconds or longer, and they may occur as many as 20 times an hour. Sleep apnea disrupts your sleep and keeps your body from getting the rest that it needs. This condition can increase your risk of certain health problems, including:  Heart attack.  Stroke.  Obesity.  Diabetes.  Heart failure.  Irregular heartbeat. There are three kinds of sleep apnea:  Obstructive sleep apnea. This kind is caused by a blocked or collapsed airway.  Central sleep apnea. This kind happens when the part of the brain that controls breathing does not send the correct signals to the muscles that control breathing.  Mixed sleep apnea. This is a combination of obstructive and central sleep apnea. What are the causes? The most common cause of this condition is a collapsed or blocked airway. An airway can collapse or become blocked if:  Your throat muscles are abnormally relaxed.  Your tongue and tonsils are larger than normal.  You are overweight.  Your airway is smaller than normal. What increases the risk? This condition is more likely to develop in people who:  Are overweight.  Smoke.  Have a smaller than normal airway.  Are elderly.  Are male.  Drink alcohol.  Take sedatives or tranquilizers.  Have a family history of sleep apnea. What are the signs or symptoms? Symptoms of this condition include:  Trouble staying asleep.  Daytime sleepiness and tiredness.  Irritability.  Loud snoring.  Morning headaches.  Trouble  concentrating.  Forgetfulness.  Decreased interest in sex.  Unexplained sleepiness.  Mood swings.  Personality changes.  Feelings of depression.  Waking up often during the night to urinate.  Dry mouth.  Sore throat. How is this diagnosed? This condition may be diagnosed with:  A medical history.  A physical exam.  A series of tests that are done while you are sleeping (sleep study). These tests are usually done in a sleep lab, but they may also be done at home. How is this treated? Treatment for this condition aims to restore normal breathing and to ease symptoms during sleep. It may involve managing health issues that can affect breathing, such as high blood pressure or obesity. Treatment may include:  Sleeping on your side.  Using a decongestant if you have nasal congestion.  Avoiding the use of depressants, including alcohol, sedatives, and narcotics.  Losing weight if you are overweight.  Making changes to your diet.  Quitting smoking.  Using a device to open your airway while you sleep, such as:  An oral appliance. This is a custom-made mouthpiece that shifts your lower jaw forward.  A continuous positive airway pressure (CPAP) device. This device delivers oxygen to your airway through a mask.  A nasal expiratory positive airway pressure (EPAP) device. This device has valves that you put into each nostril.  A bi-level positive airway pressure (BPAP) device. This device delivers oxygen to your airway through a mask.  Surgery if other treatments do not work. During surgery, excess tissue is removed to create a wider airway.  It is important to get treatment for sleep apnea. Without treatment, this condition can lead to:  High blood pressure.  Coronary artery disease.  (Men) An inability to achieve or maintain an erection (impotence).  Reduced thinking abilities. Follow these instructions at home:  Make any lifestyle changes that your health care  provider recommends.  Eat a healthy, well-balanced diet.  Take over-the-counter and prescription medicines only as told by your health care provider.  Avoid using depressants, including alcohol, sedatives, and narcotics.  Take steps to lose weight if you are overweight.  If you were given a device to open your airway while you sleep, use it only as told by your health care provider.  Do not use any tobacco products, such as cigarettes, chewing tobacco, and e-cigarettes. If you need help quitting, ask your health care provider.  Keep all follow-up visits as told by your health care provider. This is important. Contact a health care provider if:  The device that you received to open your airway during sleep is uncomfortable or does not seem to be working.  Your symptoms do not improve.  Your symptoms get worse. Get help right away if:  You develop chest pain.  You develop shortness of breath.  You develop discomfort in your back, arms, or stomach.  You have trouble speaking.  You have weakness on one side of your body.  You have drooping in your face. These symptoms may represent a serious problem that is an emergency. Do not wait to see if the symptoms will go away. Get medical help right away. Call your local emergency services (911 in the [place] Do not drive yourself to the hospital.  This information is not intended to replace advice given to you by your health care provider. Make sure you discuss any questions you have with your health care provider. Document Released: 08/16/2002 Document Revised: 04/21/2016 Document Reviewed: 06/05/2015 Elsevier Interactive Patient Education  2017 [employer]

## 2016-10-02 NOTE — Progress Notes (Signed)
Subjective:  Patient ID: [name], male    DOB: [birth year]  Age: 41 y.o. MRN: [id number]  CC: The primary encounter diagnosis was Essential hypertension. 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  HPI [facility] presents for follow up on diet controlled DM, obesity,  hypertension , anxiety and insomnia managed with paxil and alprazolam.  Snoring., waking up several times per night gasping ,  Has a morning  headache,  Throat really dry .  morning fatigue, . Needs sleep study set up for a Tuesday night .  Taking lunesta for insomnia,  Stopped the  Allegra.    Not taking simvastatin  No history of adverse reaction   GAD: using xanax 1-2/month for daytime anxiety related to travelling.  Stopped the paxil during evaluation of dry throat would like to resume it   Eye exam done , no retinopathy   Foot exam normal today   FLU SHOT given today   Last meal 10 am biscuit     Outpatient Medications Prior to Visit  Medication Sig Dispense Refill  . ALPRAZolam (XANAX) 0.5 MG tablet Take 1 tablet (0.5 mg total) by mouth daily as needed for anxiety. 30 tablet 2  . ibuprofen (ADVIL,MOTRIN) 800 MG tablet Take 1 tablet (800 mg total) by mouth every 6 (six) hours as needed. 30 tablet 0  . losartan (COZAAR) 100 MG tablet Take 1 tablet (100 mg total) by mouth daily. 90 tablet 3  . omeprazole (PRILOSEC) 40 MG capsule TAKE 1 CAPSULE (40 MG TOTAL) BY MOUTH DAILY. 90 capsule 2  . cyclobenzaprine (FLEXERIL) 10 MG tablet Take 1 tablet (10 mg total) by mouth 3 (three) times daily as needed for muscle spasms. 30 tablet 0  . fexofenadine (ALLEGRA)  180 MG tablet Take 180 mg by mouth daily.    . Lorcaserin HCl 10 MG TABS Take 1 tablet by mouth 2 (two) times daily. 60 tablet 2  . PARoxetine (PAXIL) 10 MG tablet One tablet daily 30 tablet 2  . simvastatin (ZOCOR) 40 MG tablet Take 1 tablet (40 mg total) by mouth at bedtime. 90 tablet 3  . traMADol (ULTRAM) 50 MG tablet Take 1 tablet (50 mg total) by mouth every 8 (eight) hours as needed. 30 tablet 0   No facility-administered medications prior to visit.     Review of Systems;  Patient denies  fevers, malaise, unintentional weight loss, skin rash, eye pain, sinus congestion and sinus pain, dysphagia,  hemoptysis , cough, dyspnea, wheezing, chest pain, palpitations, orthopnea, edema, abdominal pain, nausea, melena, diarrhea, constipation, flank pain, dysuria, hematuria, urinary  Frequency, nocturia, numbness, tingling, seizures,  Focal weakness, Loss of consciousness,  Tremor, insomnia, depression,  and suicidal ideation.     Objective:  BP 136/90   Pulse 73   Temp 98.2 F (36.8 C) (Oral)   Resp 17   Ht 6' (1.829 m)   Wt 287 lb (130.2 kg)   SpO2 96%   BMI 38.92 kg/m   BP Readings from Last 3 Encounters:  10/02/16 136/90  04/15/16 118/80  03/27/16 114/70    Wt Readings from Last 3 Encounters:  10/02/16 287 lb (130.2 kg)  04/15/16 283 lb 9.6 oz (128.6  kg)  03/27/16 290 lb (131.5 kg)    General appearance: alert, cooperative and appears stated age Ears: normal TM's and external ear canals both ears Throat: lips, mucosa, and tongue normal; teeth and gums normal Neck: no adenopathy, no carotid bruit, supple, symmetrical, trachea midline and thyroid not enlarged, symmetric, no tenderness/mass/nodules Back: symmetric, no curvature. ROM normal. No CVA tenderness. Lungs: clear to auscultation bilaterally Heart: regular rate and rhythm, S1, S2 normal, no murmur, click, rub or gallop Abdomen: soft, non-tender; bowel sounds normal; no masses,  no organomegaly Pulses: 2+ and  symmetric Skin: Skin color, texture, turgor normal. No rashes or lesions Lymph nodes: Cervical, supraclavicular, and axillary nodes normal.  Lab Results  Component Value Date   HGBA1C 6.8 (H) 10/02/2016   HGBA1C 6.9 (H) 01/24/2016   HGBA1C 6.0 07/05/2015    Lab Results  Component Value Date   CREATININE 0.91 10/02/2016   CREATININE 0.91 04/15/2016   CREATININE 0.90 01/24/2016    Lab Results  Component Value Date   WBC 9.1 04/15/2016   HGB 13.0 04/15/2016   HCT 37.8 (L) 04/15/2016   PLT 200.0 04/15/2016   GLUCOSE 88 10/02/2016   CHOL 207 (H) 10/02/2016   TRIG 236.0 (H) 10/02/2016   HDL 34.20 (L) 10/02/2016   LDLDIRECT 143.0 10/02/2016   LDLCALC 137 (H) 07/05/2015   ALT 32 10/02/2016   AST 24 10/02/2016   NA 140 10/02/2016   K 3.6 10/02/2016   CL 103 10/02/2016   CREATININE 0.91 10/02/2016   BUN 16 10/02/2016   CO2 30 10/02/2016   TSH 1.65 07/31/2012   HGBA1C 6.8 (H) 10/02/2016   MICROALBUR 1.4 10/02/2016    No results found.  Assessment & Plan:   Problem List Items Addressed This Visit    Diabetes mellitus type 2, diet-controlled (HCC)    well-controlled on current medications.  hemoglobin A1c has been consistently at or  less than 7.0 . Patient is up-to-date on eye exams and foot exam is normal today. Patient has proteinuria and will resume ARB.  Patient is advised to resume statin therapy for CAD risk reduction  Lab Results  Component Value Date   HGBA1C 6.8 (H) 10/02/2016   Lab Results  Component Value Date   MICROALBUR 1.4 10/02/2016         Relevant Medications   aspirin EC 81 MG tablet   simvastatin (ZOCOR) 40 MG tablet   Other Relevant Orders   Hemoglobin A1c (Completed)   Microalbumin / creatinine urine ratio (Completed)   Dyslipidemia (high LDL; low HDL)    Triglycerides s are elvated since suspending statin.  LFTS normal. Resume statin and repeat in 3 months   Lab Results  Component Value Date   CHOL 207 (H) 10/02/2016   HDL 34.20  (L) 10/02/2016   LDLCALC 137 (H) 07/05/2015   LDLDIRECT 143.0 10/02/2016   TRIG 236.0 (H) 10/02/2016   CHOLHDL 6 10/02/2016    Lab Results  Component Value Date   ALT 32 10/02/2016   AST 24 10/02/2016   ALKPHOS 43 10/02/2016   BILITOT 0.5 10/02/2016            Relevant Medications   simvastatin (ZOCOR) 40 MG tablet   Other Relevant Orders   LDL cholesterol, direct (Completed)   Lipid panel (Completed)   Generalized anxiety disorder    He felt better while taking paxil and would like to resume it      Hypertension - Primary   Relevant Medications   aspirin  EC 81 MG tablet   simvastatin (ZOCOR) 40 MG tablet   Other Relevant Orders   Comprehensive metabolic panel (Completed)   Nocturnal polysomnography (NPSG)   Insomnia secondary to anxiety    Managed with lunesta.       Relevant Medications   PARoxetine (PAXIL) 10 MG tablet   Obesity (BMI 30-39.9)    He has regained much of the weight he lost last year.  I have addressed  BMI and recommended wt loss of 10% of body weight over the next 6 months using a low glycemic index diet and regular exercise a minimum of 5 days per week.   .         Snoring    Accompanied by morning headache,  Nocturnal events, and daytime somnolence  Sleep study ordered to evaluate for sleep apnea.       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 Streed's fexofenadine, Lorcaserin HCl, traMADol, and cyclobenzaprine. I have also changed his  PARoxetine. Additionally, I am having him start on aspirin EC. Lastly, I am having him maintain his losartan, ibuprofen, ALPRAZolam, omeprazole, and simvastatin.  Meds ordered this encounter  Medications  . PARoxetine (PAXIL) 10 MG tablet    Sig: Take 1 tablet (10 mg total) by mouth daily. One tablet daily  With food    Dispense:  90 tablet    Refill:  1    Increase to 20 mg Month #2  . aspirin EC 81 MG tablet    Sig: Take 1 tablet (81 mg total) by mouth daily.  . simvastatin (ZOCOR) 40 MG tablet    Sig: Take 1 tablet (40 mg total) by mouth at bedtime.    Dispense:  90 tablet    Refill:  3    Medications Discontinued During This Encounter  Medication Reason  . cyclobenzaprine (FLEXERIL) 10 MG tablet No longer needed (for PRN medications)  . fexofenadine (ALLEGRA) 180 MG tablet Patient Discharge  . Lorcaserin HCl 10 MG TABS Patient Discharge  . PARoxetine (PAXIL) 10 MG tablet Patient Discharge  . simvastatin (ZOCOR) 40 MG tablet Patient Discharge  . traMADol (ULTRAM) 50 MG tablet No longer needed (for PRN medications)    Follow-up: Return in about 3 months (around 12/31/2016) for follow up diabetes.   Sherlene ShamsULLO, Kiona Blume L, MD

## 2016-10-03 ENCOUNTER — Other Ambulatory Visit: Payer: Self-pay | Admitting: Internal Medicine

## 2016-10-03 NOTE — Telephone Encounter (Signed)
Pt was seen yesterday and needs refill on Lunesta. Ok to refill?

## 2016-10-05 ENCOUNTER — Encounter: Payer: Self-pay | Admitting: Internal Medicine

## 2016-10-05 DIAGNOSIS — R0683 Snoring: Secondary | ICD-10-CM | POA: Insufficient documentation

## 2016-10-05 MED ORDER — SIMVASTATIN 40 MG PO TABS: 40.0000 mg | ORAL_TABLET | Freq: Every day | ORAL | 3 refills | Status: DC

## 2016-10-05 NOTE — Assessment & Plan Note (Signed)
Managed with lunesta.

## 2016-10-05 NOTE — Assessment & Plan Note (Signed)
Triglycerides s are elvated since suspending statin.  LFTS normal. Resume statin and repeat in 3 months   Lab Results  Component Value Date   CHOL 207 (H) 10/02/2016   HDL 34.20 (L) 10/02/2016   LDLCALC 137 (H) 07/05/2015   LDLDIRECT 143.0 10/02/2016   TRIG 236.0 (H) 10/02/2016   CHOLHDL 6 10/02/2016    Lab Results  Component Value Date   ALT 32 10/02/2016   AST 24 10/02/2016   ALKPHOS 43 10/02/2016   BILITOT 0.5 10/02/2016

## 2016-10-05 NOTE — Assessment & Plan Note (Signed)
He felt better while taking paxil and would like to resume it

## 2016-10-05 NOTE — Assessment & Plan Note (Signed)
well-controlled on current medications.  hemoglobin A1c has been consistently at or  less than 7.0 . Patient is up-to-date on eye exams and foot exam is normal today. Patient has proteinuria and will resume ARB.  Patient is advised to resume statin therapy for CAD risk reduction  Lab Results  Component Value Date   HGBA1C 6.8 (H) 10/02/2016   Lab Results  Component Value Date   MICROALBUR 1.4 10/02/2016

## 2016-10-05 NOTE — Assessment & Plan Note (Signed)
Accompanied by morning headache,  Nocturnal events, and daytime somnolence  Sleep study ordered to evaluate for sleep apnea.

## 2016-10-05 NOTE — Assessment & Plan Note (Signed)
He has regained much of the weight he lost last year.  I have addressed  BMI and recommended wt loss of 10% of body weight over the next 6 months using a low glycemic index diet and regular exercise a minimum of 5 days per week.   .Marland Kitchen

## 2016-10-08 ENCOUNTER — Other Ambulatory Visit: Payer: Self-pay | Admitting: Internal Medicine

## 2016-10-08 DIAGNOSIS — I10 Essential (primary) hypertension: Secondary | ICD-10-CM

## 2016-10-08 NOTE — Telephone Encounter (Signed)
Ok to Rf Alprazolam?  Last filled # 60 w/ 2 Rf on 01/24/16. Last OV 10/02/16. Next OV with you is 01/08/17.

## 2016-10-09 NOTE — Telephone Encounter (Signed)
REFILLED

## 2016-10-09 NOTE — Telephone Encounter (Signed)
Signed rx faxed to pharmacy

## 2016-11-26 ENCOUNTER — Encounter: Payer: Self-pay | Admitting: Urology

## 2016-11-26 ENCOUNTER — Ambulatory Visit: Payer: BLUE CROSS/BLUE SHIELD | Admitting: Urology

## 2016-11-26 VITALS — BP 129/83 | HR 73 | Ht 72.0 in | Wt 284.3 lb

## 2016-11-26 DIAGNOSIS — R109 Unspecified abdominal pain: Secondary | ICD-10-CM | POA: Diagnosis not present

## 2016-11-26 DIAGNOSIS — N200 Calculus of kidney: Secondary | ICD-10-CM

## 2016-11-26 LAB — URINALYSIS, COMPLETE
BILIRUBIN UA: NEGATIVE
Glucose, UA: NEGATIVE
KETONES UA: NEGATIVE
LEUKOCYTES UA: NEGATIVE
Nitrite, UA: NEGATIVE
PH UA: 5 (ref 5.0–7.5)
PROTEIN UA: NEGATIVE
Specific Gravity, UA: >1.03 — ABNORMAL HIGH (ref 1.005–1.030)
UUROB: 0.2 mg/dL (ref 0.2–1.0)

## 2016-11-26 LAB — MICROSCOPIC EXAMINATION
BACTERIA UA: NONE SEEN
EPITHELIAL CELLS (NON RENAL): NONE SEEN /HPF (ref 0–10)
WBC, UA: NONE SEEN /hpf (ref 0–?)

## 2016-11-26 NOTE — Progress Notes (Signed)
11/26/2016 10:40 AM   Charolotte CapuchinJehovany Darrick MeigsAlaric Maslin [birth year] [id number]  Referring provider: Sherlene Shamseresa L Tullo, MD [address] [place], [place] [address]  Chief Complaint  Patient presents with  . Nephrolithiasis    last seen 02/2015    HPI: Patient is a 41 year old Hispanic male who presents today for a possible kidney stone.    Patient states the onset of the pain was two days ago.   It was sharp.  It lasted for several hours.  The pain was located left side and did not radiate.  The pain was a 8/10.  Ibuprofen made the pain better.   Twisting made the pain worse.  He did have or did not have little fevers and some chills, but no nausea or vomiting.  He has not had dysuria, gross hematuria or suprapubic pain.    Non contrast CT performed in 07/24/2011 noted a nonobstructing left nephrolithiasis.     Today, he is still experiencing the pain in the left side.  6/10 pain.  Still taking ibuprofen 800 mg.  He has been taking two a day.  UA was unremarkable at today's visit.   He is not having fevers, chills, nausea or vomiting.  He has not had gross hematuria, dysuria or suprapubic pain.    He does have a prior history of stones.  Stone composition was unknown.  His UA was unremarkable.    PMH: Past Medical History:  Diagnosis Date  . Anxiety   . GERD (gastroesophageal reflux disease)   . Hyperlipidemia   . Hypertension   . Insomnia   . Kidney stones   . Obesity (BMI 30-39.9)   . Seasonal allergies     Surgical History: Past Surgical History:  Procedure Laterality Date  . NO PAST SURGERIES      Home Medications:  Allergies as of 11/26/2016      Reactions   Maple Flavor Rash   Patient also states that he gets a sore throat, numb tongue.      Medication List       Accurate as of 11/26/16 10:40 AM. Always use your most recent med list.          ALPRAZolam 0.5 MG tablet Commonly known as:  XANAX TAKE 1 TABLET BY MOUTH EVERY DAY AS NEEDED FOR ANXIETY     aspirin EC 81 MG tablet Take 1 tablet (81 mg total) by mouth daily.   eszopiclone 2 MG Tabs tablet Commonly known as:  LUNESTA TAKE 1 TABLET BY MOUTH ATBEDTIME. TAKE IMMEDIATELYBEFORE BEDTIME   ibuprofen 800 MG tablet Commonly known as:  ADVIL,MOTRIN Take 1 tablet (800 mg total) by mouth every 6 (six) hours as needed.   losartan 100 MG tablet Commonly known as:  COZAAR TAKE 1 TABLET (100 MG TOTAL) BY MOUTH DAILY.   omeprazole 40 MG capsule Commonly known as:  PRILOSEC TAKE 1 CAPSULE (40 MG TOTAL) BY MOUTH DAILY.   PARoxetine 10 MG tablet Commonly known as:  PAXIL Take 1 tablet (10 mg total) by mouth daily. One tablet daily  With food   simvastatin 40 MG tablet Commonly known as:  ZOCOR Take 1 tablet (40 mg total) by mouth at bedtime.       Allergies:  Allergies  Allergen Reactions  . Maple Flavor Rash    Patient also states that he gets a sore throat, numb tongue.    Family History: Family History  Problem Relation Age of Onset  . Hypertension Mother   .  Cancer Mother     ovarian  . Cancer Father     kidney  . Prostate cancer Neg Hx   . Bladder Cancer Neg Hx     Social History:  reports that he has never smoked. He has never used smokeless tobacco. He reports that he does not drink alcohol or use drugs.  ROS: UROLOGY Frequent Urination?: No Hard to postpone urination?: No Burning/pain with urination?: No Get up at night to urinate?: No Leakage of urine?: No Urine stream starts and stops?: No Trouble starting stream?: No Do you have to strain to urinate?: No Blood in urine?: No Urinary tract infection?: No Sexually transmitted disease?: No Injury to kidneys or bladder?: No Painful intercourse?: No Weak stream?: No Erection problems?: No Penile pain?: No  Gastrointestinal Nausea?: No Vomiting?: No Indigestion/heartburn?: No Diarrhea?: No Constipation?: No  Constitutional Fever: No Night sweats?: No Weight loss?: No Fatigue?:  No  Skin Skin rash/lesions?: No Itching?: No  Eyes Blurred vision?: No Double vision?: No  Ears/Nose/Throat Sore throat?: No Sinus problems?: No  Hematologic/Lymphatic Swollen glands?: No Easy bruising?: No  Cardiovascular Leg swelling?: No Chest pain?: No  Respiratory Cough?: No Shortness of breath?: No  Endocrine Excessive thirst?: No  Musculoskeletal Back pain?: No Joint pain?: No  Neurological Headaches?: No Dizziness?: No  Psychologic Depression?: No Anxiety?: No  Physical Exam: BP 129/83   Pulse 73   Ht 6' (1.829 m)   Wt 284 lb 4.8 oz (129 kg)   BMI 38.56 kg/m   Constitutional: Well nourished. Alert and oriented, No acute distress. HEENT:  AT, moist mucus membranes. Trachea midline, no masses. Cardiovascular: No clubbing, cyanosis, or edema. Respiratory: Normal respiratory effort, no increased work of breathing. GI: Abdomen is soft, non tender, non distended, no abdominal masses. Liver and spleen not palpable.  No hernias appreciated.  Stool sample for occult testing is not indicated.   GU: Mild left CVA tenderness.  No bladder fullness or masses.  Patient with uncircumcised phallus.  Foreskin easily retracted  Urethral meatus is patent.  No penile discharge. No penile lesions or rashes. Scrotum without lesions, cysts, rashes and/or edema.  Testicles are located scrotally bilaterally. No masses are appreciated in the testicles. Left and right epididymis are normal. Rectal: Patient with  normal sphincter tone. Anus and perineum without scarring or rashes. No rectal masses are appreciated. Prostate is approximately 35 grams, (DRE limited to patient's body habitus), no nodules are appreciated. Seminal vesicles are normal. Skin: No rashes, bruises or suspicious lesions. Lymph: No cervical or inguinal adenopathy. Neurologic: Grossly intact, no focal deficits, moving all 4 extremities. Psychiatric: Normal mood and affect.  Laboratory Data: Lab Results   Component Value Date   WBC 9.1 04/15/2016   HGB 13.0 04/15/2016   HCT 37.8 (L) 04/15/2016   MCV 85.9 04/15/2016   PLT 200.0 04/15/2016    Lab Results  Component Value Date   CREATININE 0.91 10/02/2016     Lab Results  Component Value Date   HGBA1C 6.8 (H) 10/02/2016    Lab Results  Component Value Date   TSH 1.65 07/31/2012       Component Value Date/Time   CHOL 207 (H) 10/02/2016 1456   HDL 34.20 (L) 10/02/2016 1456   CHOLHDL 6 10/02/2016 1456   VLDL 47.2 (H) 10/02/2016 1456   LDLCALC 137 (H) 07/05/2015 1205    Lab Results  Component Value Date   AST 24 10/02/2016   Lab Results  Component Value Date   ALT  32 10/02/2016    Urinalysis Unremarkable.  See EPIC.  Pertinent Imaging: REASON FOR EXAM:    hx kidney stones flank pain hematuria  COMMENTS:   PROCEDURE:     KCT - KCT ABDOMEN/PELVIS WO (STONE)  - Jul 24 2011 11:06AM   RESULT:     Indication: A flank pain   Comparison: None   Technique: Multiple axial images from the lung bases to the symphysis  pubis  were obtained without oral and without intravenous contrast.   Findings:   The lung bases are clear. There is no pleural or pericardial effusions.   There is left nephrolithiasis. No obstructive uropathy. No perinephric  stranding is seen. The kidneys are symmetric in size without evidence for  exophytic mass. There is bladder wall thickening which is likely secondary  to underdistention.   The liver is diffusely low in attenuation likely secondary to hepatic  steatosis. The gallbladder is unremarkable. The spleen demonstrates no  focal  abnormality. The adrenal glands and pancreas are normal.   The unopacified stomach, duodenum, small intestine, and large intestine  are  unremarkable, but evaluation is limited by lack of oral contrast.  There  is  no pneumoperitoneum, pneumatosis, or portal venous gas. There is no  abdominal or pelvic free fluid. There is no lymphadenopathy.    The abdominal aorta is normal in caliber.   The osseous structures are unremarkable.   IMPRESSION:   1. Nonobstructing left nephrolithiasis.     Assessment & Plan:    1. Left flank pain  - Urinalysis, Complete - negative  - CULTURE, URINE COMPREHENSIVE  - Basic metabolic panel  - CBC with Differential/Platelet  - ? Migration of left renal stone or a new stone  - Advised to contact our office or seek treatment in the ED if becomes febrile or pain/ vomiting are difficult control in order to arrange for emergent/urgent intervention  2. History kidney stones  - patient with a history of stones  - CT Renal stone pending  Return for CT Renal stone study report.  These notes generated with voice recognition software. I apologize for typographical errors.  Michiel Cowboy, PA-C  [facility] [address] [place], [place] [address] [phone]

## 2016-11-27 LAB — CBC WITH DIFFERENTIAL/PLATELET
BASOS ABS: 0 10*3/uL (ref 0.0–0.2)
Basos: 0 %
EOS (ABSOLUTE): 0.1 10*3/uL (ref 0.0–0.4)
Eos: 2 %
HEMOGLOBIN: 13.4 g/dL (ref 13.0–17.7)
Hematocrit: 38.2 % (ref 37.5–51.0)
IMMATURE GRANULOCYTES: 1 %
Immature Grans (Abs): 0 10*3/uL (ref 0.0–0.1)
LYMPHS ABS: 1.7 10*3/uL (ref 0.7–3.1)
LYMPHS: 23 %
MCH: 29.8 pg (ref 26.6–33.0)
MCHC: 35.1 g/dL (ref 31.5–35.7)
MCV: 85 fL (ref 79–97)
MONOCYTES: 7 %
Monocytes Absolute: 0.5 10*3/uL (ref 0.1–0.9)
Neutrophils Absolute: 4.9 10*3/uL (ref 1.4–7.0)
Neutrophils: 67 %
Platelets: 198 10*3/uL (ref 150–379)
RBC: 4.5 x10E6/uL (ref 4.14–5.80)
RDW: 13.7 % (ref 12.3–15.4)
WBC: 7.2 10*3/uL (ref 3.4–10.8)

## 2016-11-27 LAB — BASIC METABOLIC PANEL
BUN/Creatinine Ratio: 16 (ref 9–20)
BUN: 13 mg/dL (ref 6–24)
CHLORIDE: 101 mmol/L (ref 96–106)
CO2: 24 mmol/L (ref 18–29)
Calcium: 9.2 mg/dL (ref 8.7–10.2)
Creatinine, Ser: 0.82 mg/dL (ref 0.76–1.27)
GFR calc Af Amer: 126 mL/min/{1.73_m2} (ref >59)
GFR calc non Af Amer: 109 mL/min/{1.73_m2} (ref >59)
GLUCOSE: 149 mg/dL — AB (ref 65–99)
Potassium: 4.1 mmol/L (ref 3.5–5.2)
SODIUM: 139 mmol/L (ref 134–144)

## 2016-11-28 ENCOUNTER — Ambulatory Visit: Payer: BLUE CROSS/BLUE SHIELD | Admitting: Family Medicine

## 2016-11-28 LAB — CULTURE, URINE COMPREHENSIVE

## 2016-11-29 ENCOUNTER — Telehealth: Payer: Self-pay

## 2016-11-29 ENCOUNTER — Other Ambulatory Visit: Payer: Self-pay | Admitting: Urology

## 2016-11-29 NOTE — Telephone Encounter (Signed)
LM[facility] for [facility] records at [facility].

## 2016-11-29 NOTE — Telephone Encounter (Signed)
-----   Message from Harle BattiestShannon A McGowan, PA-C sent at 11/29/2016  8:13 AM EDT ----- Please call [facility] in [place] and ask them to put the patient's CT images on canopy.

## 2016-12-02 NOTE — Telephone Encounter (Signed)
Pt left message returning your call.  Please call pt @ [phone]

## 2016-12-02 NOTE — Telephone Encounter (Signed)
LMOM

## 2016-12-02 NOTE — Telephone Encounter (Signed)
-----   Message from Harle BattiestShannon A McGowan, PA-C sent at 11/29/2016  3:08 PM EDT ----- Please let the patient know that his urine culture was negative and the CT report does not indicate any obstructing stones.  I cannot see the images and we have been trying to contact the facility to have them transfer the images to our server.  At this time, I can only go off the report.  As of now, his pain is not caused by a kidney stones.  He needs to follow up with Dr. Darrick Huntsmanullo for further evaluation and management.

## 2016-12-03 NOTE — Telephone Encounter (Signed)
Spoke with pt in reference to CT results and not having images. Made pt aware will need to f/u with Dr. Darrick Huntsmanullo. Pt voiced understanding.

## 2016-12-04 ENCOUNTER — Ambulatory Visit
Admission: EM | Admit: 2016-12-04 | Discharge: 2016-12-04 | Disposition: A | Discharge status: Home / Self Care | Payer: BLUE CROSS/BLUE SHIELD | Attending: Internal Medicine | Admitting: Internal Medicine

## 2016-12-04 DIAGNOSIS — R109 Unspecified abdominal pain: Secondary | ICD-10-CM | POA: Diagnosis not present

## 2016-12-04 LAB — URINALYSIS, COMPLETE (UACMP) WITH MICROSCOPIC
Bacteria, UA: NONE SEEN
Bilirubin Urine: NEGATIVE
GLUCOSE, UA: NEGATIVE mg/dL
Ketones, ur: NEGATIVE mg/dL
Leukocytes, UA: NEGATIVE
NITRITE: NEGATIVE
Protein, ur: NEGATIVE mg/dL
Squamous Epithelial / LPF: NONE SEEN
pH: 5.5 (ref 5.0–8.0)

## 2016-12-04 MED ORDER — CYCLOBENZAPRINE HCL 10 MG PO TABS: 10.0000 mg | ORAL_TABLET | Freq: Two times a day (BID) | ORAL | 0 refills | Status: DC | PRN

## 2016-12-04 NOTE — ED Provider Notes (Signed)
[facility] ____________________________________________  Time seen: Approximately 9:25 AM  I have reviewed the triage vital signs and the nursing notes.   HISTORY  Chief Complaint Flank Pain   HPI Mark Welch is a 41 y.o. male  Presents for evaluation of left flank pain that has been present for the last 10 days. Patient reports he does not recall any specific onset, but reports waking up to pain and that pain gradually increased over the next 2 days, and now reports pain has improved over the last several days. Patient reports he initially thought he may even have an reoccurrence of his past kidney stones and was seen by his urologist last week. Reports at that time he did have some blood in his urine so he had a CT scan done. Patient reports CT showed that he had bilateral nonobstructing stones present in the kidneys, and reports that he was told that this is not the issue and to follow-up with his primary care physician. Patient reports he has not yet followed up with his primary care. Reports has been taking intermittent over-the-counter ibuprofen which does help. Patient presenting today as he reports pain has continued, but improved.  Patient states current pain is 2 out of 10 and worse with bending, twisting and movements. Patient reports that he does still have some pain when still. Denies fall, trauma, injury. Denies pain radiation. Denies any urinary frequency, urinary urgency, burning with urination, hesitancy, hematuria or strain changes. Denies any penile or testicular pain or complaints. Denies any other back pain. Denies abdominal pain. Denies fevers. Reports continues to eat and drink well. Patient does reports that he does a lot of moving, walking and twisting at work as he works in [employer].  Denies chest pain, shortness of breath, abdominal pain, dysuria, extremity pain, paresthesias, urinary or bowel incontinence, extremity swelling or rash. Denies  recent sickness. Denies recent antibiotic use.  Sherlene Shams, MD: PCP    Past Medical History:  Diagnosis Date  . Anxiety   . GERD (gastroesophageal reflux disease)   . Hyperlipidemia   . Hypertension   . Insomnia   . Kidney stones   . Obesity (BMI 30-39.9)   . Seasonal allergies     Patient Active Problem List   Diagnosis Date Noted  . Snoring 10/05/2016  . Right-sided low back pain without sciatica 03/27/2016  . Generalized anxiety disorder 06/29/2014  . 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 Diabetes mellitus type 2, diet-controlled (HCC) 08/02/2012  . Obesity (BMI 30-39.9)   . Hypertension   . Dyslipidemia (high LDL; low HDL)     Past Surgical History:  Procedure Laterality Date  . NO PAST SURGERIES       No current facility-administered medications for this encounter.   Current Outpatient Prescriptions:  .  ALPRAZolam (XANAX) 0.5 MG tablet, TAKE 1 TABLET BY MOUTH EVERY DAY AS NEEDED FOR ANXIETY, Disp: 30 tablet, Rfl: 4 .  aspirin EC 81 MG tablet, Take 1 tablet (81 mg total) by mouth daily., Disp: , Rfl:  .  eszopiclone (LUNESTA) 2 MG TABS tablet, TAKE 1 TABLET BY MOUTH ATBEDTIME. TAKE IMMEDIATELYBEFORE BEDTIME, Disp: 90 tablet, Rfl: 0 .  ibuprofen (ADVIL,MOTRIN) 800 MG tablet, Take 1 tablet (800 mg total) by mouth every 6 (six) hours as needed., Disp: 30 tablet, Rfl: 0 .  losartan (COZAAR) 100 MG tablet, TAKE 1 TABLET (100 MG TOTAL) BY MOUTH DAILY., Disp: 90 tablet, Rfl: 2 .  omeprazole (PRILOSEC) 40 MG capsule,  TAKE 1 CAPSULE (40 MG TOTAL) BY MOUTH DAILY., Disp: 90 capsule, Rfl: 2 .  PARoxetine (PAXIL) 10 MG tablet, Take 1 tablet (10 mg total) by mouth daily. One tablet daily  With food, Disp: 90 tablet, Rfl: 1 .  simvastatin (ZOCOR) 40 MG tablet, Take 1 tablet (40 mg total) by mouth at bedtime., Disp: 90 tablet, Rfl: 3 .  cyclobenzaprine (FLEXERIL) 10 MG tablet, Take 1 tablet (10 mg total) by mouth 2 (two) times daily as needed for muscle spasms. Do not  drive or operate machinery as can cause drowsiness., Disp: 15 tablet, Rfl: 0  Allergies Maple flavor  Family History  Problem Relation Age of Onset  . Hypertension Mother   . Cancer Mother     ovarian  . Cancer Father     kidney  . Prostate cancer Neg Hx   . Bladder Cancer Neg Hx     Social History Social History  Substance Use Topics  . Smoking status: Never Smoker  . Smokeless tobacco: Never Used  . Alcohol use No    Review of Systems Constitutional: No fever/chills Eyes: No visual changes. ENT: No sore throat. Cardiovascular: Denies chest pain. Respiratory: Denies shortness of breath. Gastrointestinal: No abdominal pain.  No nausea, no vomiting.  No diarrhea.  No constipation. Genitourinary: Negative for dysuria. Musculoskeletal: Negative for back pain. As above.  Skin: Negative for rash. Neurological: Negative for headaches, focal weakness or numbness.  10-point ROS otherwise negative.  ____________________________________________   PHYSICAL EXAM:  VITAL SIGNS: ED Triage Vitals  Enc Vitals Group     BP 12/04/16 0843 (!) 136/93     Pulse Rate 12/04/16 0843 68     Resp 12/04/16 0843 18     Temp 12/04/16 0843 97.6 F (36.4 C)     Temp Source 12/04/16 0843 Oral     SpO2 12/04/16 0843 98 %     Weight 12/04/16 0842 284 lb (128.8 kg)     Height 12/04/16 0842 6' (1.829 m)     Head Circumference --      Peak Flow --      Pain Score 12/04/16 0842 3     Pain Loc --      Pain Edu? --      Excl. in GC? --     Constitutional: Alert and oriented. Well appearing and in no acute distress. Eyes: Conjunctivae are normal. PERRL. EOMI. ENT      Head: Normocephalic and atraumatic.      Mouth/Throat: Mucous membranes are moist.Oropharynx non-erythematous. Cardiovascular: Normal rate, regular rhythm. Grossly normal heart sounds.  Good peripheral circulation. Respiratory: Normal respiratory effort without tachypnea nor retractions. Breath sounds are clear and equal  bilaterally. No wheezes, rales, rhonchi. Gastrointestinal: Soft and nontender. Obese abdomen. No rash or skin changes. No right CVA tenderness. Musculoskeletal:  Ambulatory with steady gait. No pain with standing bilateral knee list. No midline cervical, thoracic or lumbar tenderness to palpation. Except: Minimal to mild left flank to left lateral tenderness to palpation, no swelling, no erythema, no skin changes noted, pain with right and left rotation as well as left arm overhead stretching, no pain with lumbar flexion or extension, no pain with right overhead stretching. No point bony tenderness, no point rib tenderness, no midline tenderness. Changes positions quickly in room. Neurologic:  Normal speech and language. Speech is normal. No gait instability.  Skin:  Skin is warm, dry and intact. No rash noted. Psychiatric: Mood and affect are normal. Speech and behavior are  normal. Patient exhibits appropriate insight and judgment   ___________________________________________   LABS (all labs ordered are listed, but only abnormal results are displayed)  Labs Reviewed  URINALYSIS, COMPLETE (UACMP) WITH MICROSCOPIC - Abnormal; Notable for the following:       Result Value   Specific Gravity, Urine >1.030 (*)    Hgb urine dipstick TRACE (*)    All other components within normal limits    RADIOLOGY  CT renal stone study from 11/28/2016 dictated report reviewed via care everywhere. See below for details. Result Impression  IMPRESSION:  1.Small bilateral kidney stones without evidence of obstructive uropathy.   Result Narrative  INDICATION: Calculus of kidney. COMPARISON:None. TECHNIQUE: Routine CT of the abdomen and pelvis was performed without intravenous or oral contrast per renal calculus evaluation protocol. This exam is intended to evaluate for presence of renal calculi and does not adequately assess for renal or  urothelial malignancies. Radiation dose reduction was utilized  (automated exposure control, mA or kV adjustment based on patient size, or iterative image reconstruction).  FINDINGS:  VISUALIZED LOWER THORAX: Lung bases are clear, heart size is normal.  UROLOGIC: Kidneys/ureters: Small stones in the 2-4 mm range are present in both kidneys. Neither kidney is hydronephrotic, both ureters are normal. Urinary bladder:Normal urinary bladder.  SOLID VISCERA: Liver: Liver is fatty replaced with focal fatty sparing around the gallbladder fossa. Gallbladder: Normal Adrenal glands: Normal Pancreas: Normal Spleen: Normal  GI: No bowel obstruction. Normal appendix.   PERITONEAL CAVITY/RETROPERITONEUM: No free fluid. No pneumoperitoneum. No lymphadenopathy.  PELVIS: Normal prostate and seminal vesicles.  MUSCULOSKELETAL: No acute or destructive osseous processes.    PROCEDURES Procedures    ____________________________________________   INITIAL IMPRESSION / ASSESSMENT AND PLAN / ED COURSE   Pertinent labs & imaging results that were available during my care of the patient were reviewed by me and considered in my medical decision making (see chart for details).  Well-appearing patient. No acute distress. Presents for complaints of 10 days of left flank pain. Minimal left flank pain to direct palpation, pain reproduced with right and left twisting as well as left arm over head reaching. No point bony tenderness. No midline tenderness. Urinalysis today showing trace hemoglobin, no RBCs. Urinalysis otherwise unremarkable. CT renal stone study from last week reviewed. Patient reports current pain is better than last week when he had the CT performed. Again noted of small bilateral renal stones without evidence of hydronephrosis or uropathy, no acute or destructive osseous process and CT otherwise unremarkable per radiologist. Discussed in detail with patient suspect muscular pain. Discussed the patient continue home ibuprofen use as needed. Will  prescribe when necessary Flexeril, encouraged supportive care stretching heat and ice. Work note given for today and tomorrow.Discussed indication, risks and benefits of medications with patient.  Discussed follow up with Primary care physician this week. Discussed follow up and return parameters including no resolution or any worsening concerns. Patient verbalized understanding and agreed to plan.   ____________________________________________   FINAL CLINICAL IMPRESSION(S) / ED DIAGNOSES  Final diagnoses:  Left flank pain     Discharge Medication List as of 12/04/2016 10:08 AM    START taking these medications   Details  cyclobenzaprine (FLEXERIL) 10 MG tablet Take 1 tablet (10 mg total) by mouth 2 (two) times daily as needed for muscle spasms. Do not drive or operate machinery as can cause drowsiness., Starting Wed 12/04/2016, Print        Note: This dictation was prepared with Dragon dictation along  with smaller phrase technology. Any transcriptional errors that result from this process are unintentional.         Renford DillsLindsey Sylwia Cuervo, NP 12/04/16 1110

## 2016-12-04 NOTE — ED Triage Notes (Signed)
Patient complains of left flank pain x 10 days. Patient states that he thought this was his kidney stones so he saw his urologist 8 days ago. Was given blood work and CT performed that showed no obstructing stones. Patient reports that area feels swollen and is still painful .

## 2016-12-04 NOTE — Discharge Instructions (Signed)
Take medication as prescribed. Rest. Drink plenty of fluids. Stretch. Avoid strenuous activity. ° °Follow up with your primary care physician this week as needed. Return to Urgent care for new or worsening concerns.  ° °

## 2016-12-19 ENCOUNTER — Encounter: Payer: Self-pay | Admitting: Urology

## 2016-12-19 ENCOUNTER — Ambulatory Visit: Payer: Managed Care, Other (non HMO) | Admitting: Urology

## 2016-12-30 ENCOUNTER — Ambulatory Visit (INDEPENDENT_AMBULATORY_CARE_PROVIDER_SITE_OTHER): Payer: BLUE CROSS/BLUE SHIELD | Admitting: Internal Medicine

## 2016-12-30 ENCOUNTER — Encounter: Payer: Self-pay | Admitting: Internal Medicine

## 2016-12-30 VITALS — BP 138/94 | HR 73 | Temp 97.7°F | Resp 16 | Ht 72.0 in | Wt 287.4 lb

## 2016-12-30 DIAGNOSIS — K760 Fatty (change of) liver, not elsewhere classified: Secondary | ICD-10-CM | POA: Diagnosis not present

## 2016-12-30 DIAGNOSIS — E785 Hyperlipidemia, unspecified: Secondary | ICD-10-CM | POA: Diagnosis not present

## 2016-12-30 DIAGNOSIS — E119 Type 2 diabetes mellitus without complications: Secondary | ICD-10-CM | POA: Diagnosis not present

## 2016-12-30 DIAGNOSIS — R109 Unspecified abdominal pain: Secondary | ICD-10-CM

## 2016-12-30 DIAGNOSIS — E669 Obesity, unspecified: Secondary | ICD-10-CM | POA: Diagnosis not present

## 2016-12-30 DIAGNOSIS — N200 Calculus of kidney: Secondary | ICD-10-CM

## 2016-12-30 LAB — COMPREHENSIVE METABOLIC PANEL
ALT: 33 U/L (ref 0–53)
AST: 24 U/L (ref 0–37)
Albumin: 4 g/dL (ref 3.5–5.2)
Alkaline Phosphatase: 46 U/L (ref 39–117)
BILIRUBIN TOTAL: 0.5 mg/dL (ref 0.2–1.2)
BUN: 18 mg/dL (ref 6–23)
CALCIUM: 9.3 mg/dL (ref 8.4–10.5)
CHLORIDE: 102 meq/L (ref 96–112)
CO2: 29 meq/L (ref 19–32)
Creatinine, Ser: 0.92 mg/dL (ref 0.40–1.50)
GFR: 95.42 mL/min (ref >60.00)
GLUCOSE: 132 mg/dL — AB (ref 70–99)
Potassium: 3.8 mEq/L (ref 3.5–5.1)
SODIUM: 137 meq/L (ref 135–145)
Total Protein: 7.4 g/dL (ref 6.0–8.3)

## 2016-12-30 LAB — LIPID PANEL
CHOLESTEROL: 215 mg/dL — AB (ref 0–200)
HDL: 32 mg/dL — ABNORMAL LOW (ref >39.00)
NONHDL: 183.27
Total CHOL/HDL Ratio: 7
Triglycerides: 321 mg/dL — ABNORMAL HIGH (ref 0.0–149.0)
VLDL: 64.2 mg/dL — AB (ref 0.0–40.0)

## 2016-12-30 LAB — LDL CHOLESTEROL, DIRECT: Direct LDL: 142 mg/dL

## 2016-12-30 LAB — URINALYSIS, ROUTINE W REFLEX MICROSCOPIC
BILIRUBIN URINE: NEGATIVE
KETONES UR: NEGATIVE
LEUKOCYTES UA: NEGATIVE
Nitrite: NEGATIVE
PH: 6 (ref 5.0–8.0)
RBC / HPF: NONE SEEN (ref 0–?)
Specific Gravity, Urine: 1.025 (ref 1.000–1.030)
Total Protein, Urine: NEGATIVE
URINE GLUCOSE: NEGATIVE
UROBILINOGEN UA: 0.2 (ref 0.0–1.0)
WBC, UA: NONE SEEN (ref 0–?)

## 2016-12-30 MED ORDER — AMLODIPINE BESYLATE 2.5 MG PO TABS: 2.5000 mg | ORAL_TABLET | Freq: Every day | ORAL | 0 refills | Status: DC

## 2016-12-30 MED ORDER — TRAMADOL HCL 50 MG PO TABS
50.0000 mg | ORAL_TABLET | Freq: Three times a day (TID) | ORAL | 0 refills | Status: DC | PRN
Start: 2016-12-30 — End: 2017-03-31

## 2016-12-30 MED ORDER — IBUPROFEN 800 MG PO TABS: 800.0000 mg | ORAL_TABLET | Freq: Three times a day (TID) | ORAL | 1 refills | Status: DC | PRN

## 2016-12-30 MED ORDER — TAMSULOSIN HCL 0.4 MG PO CAPS: 0.4000 mg | ORAL_CAPSULE | Freq: Every day | ORAL | 3 refills | Status: DC

## 2016-12-30 MED ORDER — IBUPROFEN 800 MG PO TABS: 800.0000 mg | ORAL_TABLET | Freq: Three times a day (TID) | ORAL | 1 refills | Status: AC | PRN

## 2016-12-30 NOTE — Progress Notes (Signed)
Subjective:  Patient ID: Mark Welch, male    DOB: 05/02/1976  Age: 41 y.o. MRN: [id number]  CC: The primary encounter diagnosis was Flank pain. Diagnoses of Fatty liver disease, nonalcoholic, Diabetes mellitus type 2, diet-controlled (HCC), Obesity (BMI 30-39.9), Dyslipidemia (high LDL; low HDL), and Nephrolithiasis were also pertinent to this visit.  HPI Mark Welch presents for follow up on hypertension,  Type 2 dm,  Obesity   Cc: Recurrent left sided pain for a month Evaluated for left sided pain. 4 weeks ago  Told he had nonobstructing stones by urology after CT scan done at [facility] . Had recurrent pain 2 weeks ago, saw urgent care,  Told it was muscle spasm.  Fatty liver noted on CT . Not exercising regularly, has gained 3 lbs since last visit .  morning, usually.     D: following a low carb diet.  Taking medications as directed. cbgs have been consistently under 130.  No neuropathy.  Has  had eye exam in the last year.    Lab Results  Component Value Date   HGBA1C 6.8 (H) 10/02/2016       Outpatient Medications Prior to Visit  Medication Sig Dispense Refill  . ALPRAZolam (XANAX) 0.5 MG tablet TAKE 1 TABLET BY MOUTH EVERY DAY AS NEEDED FOR ANXIETY 30 tablet 4  . aspirin EC 81 MG tablet Take 1 tablet (81 mg total) by mouth daily.    . cyclobenzaprine (FLEXERIL) 10 MG tablet Take 1 tablet (10 mg total) by mouth 2 (two) times daily as needed for muscle spasms. Do not drive or operate machinery as can cause drowsiness. 15 tablet 0  . eszopiclone (LUNESTA) 2 MG TABS tablet TAKE 1 TABLET BY MOUTH ATBEDTIME. TAKE IMMEDIATELYBEFORE BEDTIME 90 tablet 0  . losartan (COZAAR) 100 MG tablet TAKE 1 TABLET (100 MG TOTAL) BY MOUTH DAILY. 90 tablet 2  . omeprazole (PRILOSEC) 40 MG capsule TAKE 1 CAPSULE (40 MG TOTAL) BY MOUTH DAILY. 90 capsule 2  . PARoxetine (PAXIL) 10 MG tablet Take 1 tablet (10 mg total) by mouth daily. One tablet daily  With food 90 tablet 1    . simvastatin (ZOCOR) 40 MG tablet Take 1 tablet (40 mg total) by mouth at bedtime. 90 tablet 3  . ibuprofen (ADVIL,MOTRIN) 800 MG tablet Take 1 tablet (800 mg total) by mouth every 6 (six) hours as needed. 30 tablet 0   No facility-administered medications prior to visit.     Review of Systems;  Patient denies headache, fevers, malaise, unintentional weight loss, skin rash, eye pain, sinus congestion and sinus pain, sore throat, dysphagia,  hemoptysis , cough, dyspnea, wheezing, chest pain, palpitations, orthopnea, edema, abdominal pain, nausea, melena, diarrhea, constipation, flank pain, dysuria, hematuria, urinary  Frequency, nocturia, numbness, tingling, seizures,  Focal weakness, Loss of consciousness,  Tremor, insomnia, depression, anxiety, and suicidal ideation.      Objective:  BP (!) 138/94   Pulse 73   Temp 97.7 F (36.5 C) (Oral)   Resp 16   Ht 6' (1.829 m)   Wt 287 lb 6.4 oz (130.4 kg)   SpO2 97%   BMI 38.98 kg/m   BP Readings from Last 3 Encounters:  12/30/16 (!) 138/94  12/04/16 (!) 136/93  11/26/16 129/83    Wt Readings from Last 3 Encounters:  12/30/16 287 lb 6.4 oz (130.4 kg)  12/04/16 284 lb (128.8 kg)  11/26/16 284 lb 4.8 oz (129 kg)    General appearance: alert, cooperative and  appears stated age Ears: normal TM's and external ear canals both ears Throat: lips, mucosa, and tongue normal; teeth and gums normal Neck: no adenopathy, no carotid bruit, supple, symmetrical, trachea midline and thyroid not enlarged, symmetric, no tenderness/mass/nodules Back: symmetric, no curvature. ROM normal. No CVA tenderness. Lungs: clear to auscultation bilaterally Heart: regular rate and rhythm, S1, S2 normal, no murmur, click, rub or gallop Abdomen: soft, non-tender; bowel sounds normal; no masses,  no organomegaly Pulses: 2+ and symmetric Skin: Skin color, texture, turgor normal. No rashes or lesions Lymph nodes: Cervical, supraclavicular, and axillary nodes  normal.  Lab Results  Component Value Date   HGBA1C 6.8 (H) 10/02/2016   HGBA1C 6.9 (H) 01/24/2016   HGBA1C 6.0 07/05/2015    Lab Results  Component Value Date   CREATININE 0.92 12/30/2016   CREATININE 0.82 11/26/2016   CREATININE 0.91 10/02/2016    Lab Results  Component Value Date   WBC 7.2 11/26/2016   HGB 13.0 04/15/2016   HCT 38.2 11/26/2016   PLT 198 11/26/2016   GLUCOSE 132 (H) 12/30/2016   CHOL 215 (H) 12/30/2016   TRIG 321.0 (H) 12/30/2016   HDL 32.00 (L) 12/30/2016   LDLDIRECT 142.0 12/30/2016   LDLCALC 137 (H) 07/05/2015   ALT 33 12/30/2016   AST 24 12/30/2016   NA 137 12/30/2016   K 3.8 12/30/2016   CL 102 12/30/2016   CREATININE 0.92 12/30/2016   BUN 18 12/30/2016   CO2 29 12/30/2016   TSH 1.65 07/31/2012   HGBA1C 6.8 (H) 10/02/2016   MICROALBUR 1.4 10/02/2016    No results found.  Assessment & Plan:   Problem List Items Addressed This Visit    Diabetes mellitus type 2, diet-controlled (HCC)    well-controlled on current medications.  hemoglobin A1c has been consistently at or  less than 7.0 . Patient is up-to-date on eye exams and foot exam is normal today. Patient has proteinuria and will resume ARB.  Patient is advised to resume statin therapy for CAD risk reduction  Lab Results  Component Value Date   HGBA1C 6.8 (H) 10/02/2016   Lab Results  Component Value Date   MICROALBUR 1.4 10/02/2016         Dyslipidemia (high LDL; low HDL)    LDL and triglycerides are not  at goal on current medications. He is taking simvastatin,  has no side effects and liver enzymes are normal. Will repeat in 3 months and change medication to lower triglycerides if over 300.  Lab Results  Component Value Date   CHOL 215 (H) 12/30/2016   HDL 32.00 (L) 12/30/2016   LDLCALC 137 (H) 07/05/2015   LDLDIRECT 142.0 12/30/2016   TRIG 321.0 (H) 12/30/2016   CHOLHDL 7 12/30/2016          Fatty liver disease, nonalcoholic    Presumed by ultrasound changes and  serologies negative for autoimmune causes of hepatitis.  Current liver enzymes are normal and all modifiable risk factors including obesity, diabetes and hyperlipidemia have been addressed   Lab Results  Component Value Date   ALT 33 12/30/2016   AST 24 12/30/2016   ALKPHOS 46 12/30/2016   BILITOT 0.5 12/30/2016         Relevant Orders   Comprehensive metabolic panel (Completed)   LDL cholesterol, direct (Completed)   Lipid panel (Completed)   Hepatitis B Surface AntiBODY (Completed)   Nephrolithiasis    Noted during recent er visit flomax and tramadol for next occurrence. We discussed general stone  prevention techniques including drinking plenty water with goal of producing 2.5 L urine daily, increased citric acid intake, avoidance of high oxalate containing foods, and decreased salt intake.      Relevant Medications   traMADol (ULTRAM) 50 MG tablet   Obesity (BMI 30-39.9)    I have addressed  BMI and recommended a low glycemic index diet utilizing smaller more frequent meals to increase metabolism.  I have also recommended that patient start exercising with a goal of 30 minutes of aerobic exercise a minimum of 5 days per week         Other Visit Diagnoses    Flank pain    -  Primary   Relevant Orders   Urinalysis, Routine w reflex microscopic (Completed)      I am having Mark Welch start on amLODipine, traMADol, and tamsulosin. I am also having him maintain his omeprazole, PARoxetine, aspirin EC, eszopiclone, simvastatin, losartan, ALPRAZolam, cyclobenzaprine, and ibuprofen.   A total of 40 minutes was spent with patient more than half of which was spent in counseling patient on the above mentioned issues , reviewing and explaining recent labs and imaging studies done, and coordination of care. Meds ordered this encounter  Medications  . amLODipine (NORVASC) 2.5 MG tablet    Sig: Take 1 tablet (2.5 mg total) by mouth daily.    Dispense:  90 tablet    Refill:  0  .  DISCONTD: ibuprofen (ADVIL,MOTRIN) 800 MG tablet    Sig: Take 1 tablet (800 mg total) by mouth every 8 (eight) hours as needed.    Dispense:  90 tablet    Refill:  1  . traMADol (ULTRAM) 50 MG tablet    Sig: Take 1 tablet (50 mg total) by mouth every 8 (eight) hours as needed. For severe pain    Dispense:  90 tablet    Refill:  0  . tamsulosin (FLOMAX) 0.4 MG CAPS capsule    Sig: Take 1 capsule (0.4 mg total) by mouth daily. As needed    Dispense:  30 capsule    Refill:  3  . ibuprofen (ADVIL,MOTRIN) 800 MG tablet    Sig: Take 1 tablet (800 mg total) by mouth every 8 (eight) hours as needed.    Dispense:  90 tablet    Refill:  1    Medications Discontinued During This Encounter  Medication Reason  . ibuprofen (ADVIL,MOTRIN) 800 MG tablet Reorder  . ibuprofen (ADVIL,MOTRIN) 800 MG tablet Reorder    Follow-up: Return in about 3 months (around 03/31/2017) for follow up diabetes.   Sherlene Shams, MD

## 2016-12-30 NOTE — Progress Notes (Signed)
Pre visit review using our clinic review tool, if applicable. No additional management support is needed unless otherwise documented below in the visit note. 

## 2016-12-30 NOTE — Patient Instructions (Signed)
If you have another episode of pain due to [facility] [place] stone,  You can add tramadol to the ibuprofen and also start Flomax to help you pass the stone   Fatty Liver Fatty liver, also called hepatic steatosis or steatohepatitis, is a condition in which too much fat has built up in your liver cells. The liver removes harmful substances from your bloodstream. It produces fluids your body needs. It also helps your body use and store energy from the food you eat. In many cases, fatty liver does not cause symptoms or problems. It is often diagnosed when tests are being done for other reasons. However, over time, fatty liver can cause inflammation that may lead to more serious liver problems, such as scarring of the liver (cirrhosis). What are the causes? Causes of fatty liver may include:  Drinking too much alcohol.  Poor nutrition.  Obesity.  Cushing syndrome.  Diabetes.  Hyperlipidemia.  Pregnancy.  Certain drugs.  Poisons.  Some viral infections. What increases the risk? You may be more likely to develop fatty liver if you:  Abuse alcohol.  Are pregnant.  Are overweight.  Have diabetes.  Have hepatitis.  Have a high triglyceride level. What are the signs or symptoms? Fatty liver often does not cause any symptoms. In cases where symptoms develop, they can include:  Fatigue.  Weakness.  Weight loss.  Confusion.  Abdominal pain.  Yellowing of your skin and the white parts of your eyes (jaundice).  Nausea and vomiting. How is this diagnosed? Fatty liver may be diagnosed by:  Physical exam and medical history.  Blood tests.  Imaging tests, such as an ultrasound, CT scan, or MRI.  Liver biopsy. A small sample of liver tissue is removed using a needle. The sample is then looked at under a microscope. How is this treated? Fatty liver is often caused by other health conditions. Treatment for fatty liver may involve medicines and lifestyle changes to manage conditions  such as:  Alcoholism.  High cholesterol.  Diabetes.  Being overweight or obese. Follow these instructions at home:  Eat a healthy diet as directed by your health care provider.  Exercise regularly. This can help you lose weight and control your cholesterol and diabetes. Talk to your health care provider about an exercise plan and which activities are best for you.  Do not drink alcohol.  Take medicines only as directed by your health care provider. Contact a health care provider if: You have difficulty controlling your:  Blood sugar.  Cholesterol.  Alcohol consumption. Get help right away if:  You have abdominal pain.  You have jaundice.  You have nausea and vomiting. This information is not intended to replace advice given to you by your health care provider. Make sure you discuss any questions you have with your health care provider. Document Released: 10/11/2005 Document Revised: 02/01/2016 Document Reviewed: 01/05/2014 Elsevier Interactive Patient Education  2017 [employer].

## 2016-12-31 ENCOUNTER — Encounter: Payer: Self-pay | Admitting: Internal Medicine

## 2016-12-31 DIAGNOSIS — N200 Calculus of kidney: Secondary | ICD-10-CM | POA: Insufficient documentation

## 2016-12-31 LAB — HEPATITIS B SURFACE ANTIBODY,QUALITATIVE: HEP B S AB: NEGATIVE

## 2016-12-31 NOTE — Assessment & Plan Note (Signed)
Noted during recent er visit flomax and tramadol for next occurrence. We discussed general stone prevention techniques including drinking plenty water with goal of producing 2.5 L urine daily, increased citric acid intake, avoidance of high oxalate containing foods, and decreased salt intake.

## 2016-12-31 NOTE — Assessment & Plan Note (Signed)
I have addressed  BMI and recommended a low glycemic index diet utilizing smaller more frequent meals to increase metabolism.  I have also recommended that patient start exercising with a goal of 30 minutes of aerobic exercise a minimum of 5 days per week.  

## 2016-12-31 NOTE — Assessment & Plan Note (Signed)
well-controlled on current medications.  hemoglobin A1c has been consistently at or  less than 7.0 . Patient is up-to-date on eye exams and foot exam is normal today. Patient has proteinuria and will resume ARB.  Patient is advised to resume statin therapy for CAD risk reduction  Lab Results  Component Value Date   HGBA1C 6.8 (H) 10/02/2016   Lab Results  Component Value Date   MICROALBUR 1.4 10/02/2016

## 2016-12-31 NOTE — Assessment & Plan Note (Signed)
LDL and triglycerides are not  at goal on current medications. He is taking simvastatin,  has no side effects and liver enzymes are normal. Will repeat in 3 months and change medication to lower triglycerides if over 300.  Lab Results  Component Value Date   CHOL 215 (H) 12/30/2016   HDL 32.00 (L) 12/30/2016   LDLCALC 137 (H) 07/05/2015   LDLDIRECT 142.0 12/30/2016   TRIG 321.0 (H) 12/30/2016   CHOLHDL 7 12/30/2016

## 2016-12-31 NOTE — Assessment & Plan Note (Signed)
Presumed by ultrasound changes and serologies negative for autoimmune causes of hepatitis.  Current liver enzymes are normal and all modifiable risk factors including obesity, diabetes and hyperlipidemia have been addressed   Lab Results  Component Value Date   ALT 33 12/30/2016   AST 24 12/30/2016   ALKPHOS 46 12/30/2016   BILITOT 0.5 12/30/2016

## 2017-01-07 ENCOUNTER — Other Ambulatory Visit: Payer: Self-pay | Admitting: Internal Medicine

## 2017-01-08 ENCOUNTER — Ambulatory Visit: Payer: BLUE CROSS/BLUE SHIELD | Admitting: Internal Medicine

## 2017-02-24 ENCOUNTER — Encounter: Payer: Self-pay | Admitting: Family

## 2017-02-24 ENCOUNTER — Ambulatory Visit (INDEPENDENT_AMBULATORY_CARE_PROVIDER_SITE_OTHER): Payer: BLUE CROSS/BLUE SHIELD | Admitting: Family

## 2017-02-24 VITALS — BP 120/92 | HR 78 | Temp 98.6°F | Resp 17 | Ht 72.0 in | Wt 292.4 lb

## 2017-02-24 DIAGNOSIS — M2550 Pain in unspecified joint: Secondary | ICD-10-CM

## 2017-02-24 MED ORDER — MELOXICAM 7.5 MG PO TABS: 7.5000 mg | ORAL_TABLET | Freq: Every day | ORAL | 0 refills | Status: DC

## 2017-02-24 NOTE — Progress Notes (Signed)
Subjective:    Patient ID: Mark Welch, male    DOB: 05/26/1976, 41 y.o.   MRN: [id number]  CC: Mark Welch is a 41 y.o. male who presents today for an acute visit.    HPI: Here for acute pain of right shoulder and right knee as 2 weeks. Continuous.  Sleeps on right side and painful to sleep on it .  Right shoulder has been feeling better. Had been doing home renovation project with flooring.  No numbness, tingling, neck pain. Describes as sharp pain when 'move it' or 'carries heavy objects. ' No pain with meal. Pain resolves when still. Using ice and ibuprofen which helps.  Right knee pain for off and on for 'years'. Aggrevated recently when working on floors. Describes numbness on lateral side of right knee. When kneeling down and putting weight on it, feels pain, stab. No catching. Feels may give way. Hasn't tried anything for knee. Feels better when not doing work on knees.   No knee or back surgeries.   No personal h/o cancer. No CKD       HISTORY:  Past Medical History:  Diagnosis Date  . Anxiety   . GERD (gastroesophageal reflux disease)   . Hyperlipidemia   . Hypertension   . Insomnia   . Kidney stones   . Obesity (BMI 30-39.9)   . Seasonal allergies    Past Surgical History:  Procedure Laterality Date  . NO PAST SURGERIES     Family History  Problem Relation Age of Onset  . Hypertension Mother   . Cancer Mother        ovarian  . Cancer Father        kidney  . Prostate cancer Neg Hx   . Bladder Cancer Neg Hx     Allergies: Maple flavor Current Outpatient Prescriptions on File Prior to Visit  Medication Sig Dispense Refill  . ALPRAZolam (XANAX) 0.5 MG tablet TAKE 1 TABLET BY MOUTH EVERY DAY AS NEEDED FOR ANXIETY 30 tablet 4  . amLODipine (NORVASC) 2.5 MG tablet Take 1 tablet (2.5 mg total) by mouth daily. 90 tablet 0  . aspirin EC 81 MG tablet Take 1 tablet (81 mg total) by mouth daily.    . eszopiclone (LUNESTA) 2 MG TABS  tablet TAKE ONE TABLET BY MOUTH IMMEDIATELY BEFORE BEDTIME. 90 tablet 0  . ibuprofen (ADVIL,MOTRIN) 800 MG tablet Take 1 tablet (800 mg total) by mouth every 8 (eight) hours as needed. 90 tablet 1  . losartan (COZAAR) 100 MG tablet TAKE 1 TABLET (100 MG TOTAL) BY MOUTH DAILY. 90 tablet 2  . omeprazole (PRILOSEC) 40 MG capsule TAKE 1 CAPSULE (40 MG TOTAL) BY MOUTH DAILY. 90 capsule 2  . PARoxetine (PAXIL) 10 MG tablet Take 1 tablet (10 mg total) by mouth daily. One tablet daily  With food 90 tablet 1  . traMADol (ULTRAM) 50 MG tablet Take 1 tablet (50 mg total) by mouth every 8 (eight) hours as needed. For severe pain 90 tablet 0   No current facility-administered medications on file prior to visit.     Social History  Substance Use Topics  . Smoking status: Never Smoker  . Smokeless tobacco: Never Used  . Alcohol use No    Review of Systems  Constitutional: Negative for chills and fever.  Respiratory: Negative for cough.   Cardiovascular: Negative for chest pain and palpitations.  Gastrointestinal: Negative for nausea and vomiting.  Musculoskeletal: Positive for arthralgias. Negative for back pain, gait  problem and joint swelling.      Objective:    BP (!) 120/92 (BP Location: Left Arm, Patient Position: Sitting, Cuff Size: Large)   Pulse 78   Temp 98.6 F (37 C) (Oral)   Resp 17   Ht 6' (1.829 m)   Wt 292 lb 6.4 oz (132.6 kg)   SpO2 97%   BMI 39.66 kg/m    Physical Exam  Constitutional: He appears well-developed and well-nourished.  Cardiovascular: Regular rhythm and normal heart sounds.   Pulmonary/Chest: Effort normal and breath sounds normal. No respiratory distress. He has no wheezes. He has no rhonchi. He has no rales.  Musculoskeletal:       Right shoulder: He exhibits tenderness. He exhibits normal range of motion, no bony tenderness, no pain and no spasm.       Right knee: He exhibits normal range of motion, no swelling, no effusion and no laceration. Tenderness  found. Lateral joint line tenderness noted.  Right Shoulder:   No asymmetry of shoulders when comparing right and left.Slght tenderness with palpation over lateral glenohumeral joint line. No pain over [place] joint, AC joint, or bicipital groove. No pain with internal and external rotation. No pain with resisted lateral extension .   Negative active painful arc sign. Negative passive arc ( Neer's). Negative drop arm. No pain, swelling, or ecchymosis noted over long head of biceps.  Strength and sensation normal BUE's.  Bilateral knees are symmetric. No effusion appreciated. No increase in warmth or erythema. No crepitus felt with flexion of bilateral knees.  Right knee:  Able to extend to -5 to 10 degrees and flex to 110 degrees. No catching with McMurray maneuver. Pain described as lateral right knee.  No patellar apprehension. Negative anterior drawer and lachman's- no laxity appreciated.  No calf tenderness of lower leg edema bilaterally.    Neurological: He is alert.  Skin: Skin is warm and dry.  Psychiatric: He has a normal mood and affect. His speech is normal and behavior is normal.  Vitals reviewed.      Assessment & Plan:   1. Arthralgia, unspecified joint Discussed with patient that ZI suspect working on floors and recent home renovation has aggravated right shoulder and right knee. Working diagnosis of right knee pain consistent with meniscal and/or arthritic etiology. Shoulder exam largely benign. Patient jointly agreed on conservative management of both knee and shoulder at this time. No contraindications to NSAIDs. We'll trial Mobic and advise heat/ice. Patient let me know if not better that time we will consider physical therapy, orthopedics.  - meloxicam (MOBIC) 7.5 MG tablet; Take 1 tablet (7.5 mg total) by mouth daily.  Dispense: 30 tablet; Refill: 0    I have discontinued Mark Welch's simvastatin, cyclobenzaprine, and tamsulosin. I am also having him start on  meloxicam. Additionally, I am having him maintain his omeprazole, PARoxetine, aspirin EC, losartan, ALPRAZolam, amLODipine, traMADol, ibuprofen, and eszopiclone.   Meds ordered this encounter  Medications  . meloxicam (MOBIC) 7.5 MG tablet    Sig: Take 1 tablet (7.5 mg total) by mouth daily.    Dispense:  30 tablet    Refill:  0    Order Specific Question:   Supervising Provider    Answer:   Sherlene Shams [[id number]]    Return precautions given.   Risks, benefits, and alternatives of the medications and treatment plan prescribed today were discussed, and patient expressed understanding.   Education regarding symptom management and diagnosis given to patient on AVS.  Continue to follow with Sherlene Shams, MD for routine health maintenance.   Mark Welch and I agreed with plan.   Rennie Plowman, FNP

## 2017-02-24 NOTE — Patient Instructions (Signed)
As we discussed, Start Mobic once a day. Please ensure to take this medication with food Considered leg lifts for quadriceps strengthening to protect the joint. Ice and heat for knee and shoulder. If these things do not help the pain, please let us know and we will consider imaging, physical therapy, orthopedics.

## 2017-03-31 ENCOUNTER — Ambulatory Visit (INDEPENDENT_AMBULATORY_CARE_PROVIDER_SITE_OTHER): Payer: BLUE CROSS/BLUE SHIELD | Admitting: Internal Medicine

## 2017-03-31 ENCOUNTER — Encounter: Payer: Self-pay | Admitting: Internal Medicine

## 2017-03-31 ENCOUNTER — Ambulatory Visit (INDEPENDENT_AMBULATORY_CARE_PROVIDER_SITE_OTHER): Payer: BLUE CROSS/BLUE SHIELD

## 2017-03-31 VITALS — BP 118/76 | HR 78 | Temp 98.3°F | Resp 16 | Ht 72.0 in | Wt 293.2 lb

## 2017-03-31 DIAGNOSIS — G8929 Other chronic pain: Secondary | ICD-10-CM | POA: Diagnosis not present

## 2017-03-31 DIAGNOSIS — M25511 Pain in right shoulder: Secondary | ICD-10-CM

## 2017-03-31 DIAGNOSIS — E785 Hyperlipidemia, unspecified: Secondary | ICD-10-CM

## 2017-03-31 DIAGNOSIS — E119 Type 2 diabetes mellitus without complications: Secondary | ICD-10-CM

## 2017-03-31 DIAGNOSIS — E669 Obesity, unspecified: Secondary | ICD-10-CM | POA: Diagnosis not present

## 2017-03-31 MED ORDER — LIRAGLUTIDE -WEIGHT MANAGEMENT 18 MG/3ML ~~LOC~~ SOPN: 0.6000 mg | PEN_INJECTOR | Freq: Every day | SUBCUTANEOUS | 0 refills | Status: DC

## 2017-03-31 NOTE — Patient Instructions (Signed)
I am recommending use of the medication called Saxenda to help you lose weight.  It is similar to a a medicine that is used to treat diabetes called Victoza,  So It may lower your blood sugars .   It is injected daily in incrementally increasing doses (if tolerated,  Nausea usually resolves in a few days)"  0.6 mg daily   Week 1 1.2 mg daily Week 2 1.8 mg  [place] Week 3 2.4 mg daily Week 4 3.0 mg daily Week 5 and ongoing   If you want to bring the pen with you to be shown how to give yourself the dose,  We wil make you an  RN visit once you pick up your medication from your pharmacy     For your shoulder, you can use the meloxicam. OR THE ibuprofen but not both.  You can add tylenol to EITHER ONE. As needed, but limite your daily dose to 1000 mg daily  In divided doses (500 mg twice daily for example)

## 2017-03-31 NOTE — Progress Notes (Signed)
Subjective:  Patient ID: Mark Welch, male    DOB: 1976-04-07  Age: 41 y.o. MRN: [id number]  CC: The primary encounter diagnosis was Chronic right shoulder pain. Diagnoses of Obesity (BMI 30-39.9), Dyslipidemia (high LDL; low HDL), and Diabetes mellitus type 2, diet-controlled ([place]) were also pertinent to this visit.  HPI [facility] presents for follow up on A3EN complicated by pbesity , ,  Fatty liver,  Hypertension and, hyperlipidemia.   Patient has no complaints today.  Patient is following a low glycemic index diet and taking all prescribed medications regularly without side effects.  Fasting sugars have been under less than 140 most of the time and post prandials have been under 160 except on rare occasions. Patient is not exercising due to joint issues and has gained weight  .  Patient has had an eye exam in the last 12 months and checks feet regularly for signs of infection.  Patient does not walk barefoot outside,  And denies an numbness tingling or burning in feet. Patient is up to date on all recommended vaccinations  Saw sports medicine at [facility] after seeing Joycelyn Schmid for right knee pain in June . Presumed due to medial meniscus tear, degenerative joint disease with osteophyte formation noted on plain flims. . Has been doing the prescribed exercises and knee feeling better   Pain in right shoulder aggravated by sleeping on right side  Has gained weight due to decreased exercise   Lab Results  Component Value Date   MICROALBUR 1.4 10/02/2016    Lab Results  Component Value Date   HGBA1C 8.2 (H) 04/02/2017     Outpatient Medications Prior to Visit  Medication Sig Dispense Refill  . ALPRAZolam (XANAX) 0.5 MG tablet TAKE 1 TABLET BY MOUTH EVERY DAY AS NEEDED FOR ANXIETY 30 tablet 4  . amLODipine (NORVASC) 2.5 MG tablet Take 1 tablet (2.5 mg total) by mouth daily. 90 tablet 0  . eszopiclone (LUNESTA) 2 MG TABS tablet TAKE ONE TABLET BY MOUTH IMMEDIATELY  BEFORE BEDTIME. 90 tablet 0  . ibuprofen (ADVIL,MOTRIN) 800 MG tablet Take 1 tablet (800 mg total) by mouth every 8 (eight) hours as needed. 90 tablet 1  . losartan (COZAAR) 100 MG tablet TAKE 1 TABLET (100 MG TOTAL) BY MOUTH DAILY. 90 tablet 2  . meloxicam (MOBIC) 7.5 MG tablet Take 1 tablet (7.5 mg total) by mouth daily. 30 tablet 0  . omeprazole (PRILOSEC) 40 MG capsule TAKE 1 CAPSULE (40 MG TOTAL) BY MOUTH DAILY. 90 capsule 2  . PARoxetine (PAXIL) 10 MG tablet Take 1 tablet (10 mg total) by mouth daily. One tablet daily  With food 90 tablet 1  . aspirin EC 81 MG tablet Take 1 tablet (81 mg total) by mouth daily. (Patient not taking: Reported on 03/31/2017)    . traMADol (ULTRAM) 50 MG tablet Take 1 tablet (50 mg total) by mouth every 8 (eight) hours as needed. For severe pain (Patient not taking: Reported on 03/31/2017) 90 tablet 0   No facility-administered medications prior to visit.     Review of Systems;  Patient denies headache, fevers, malaise, unintentional weight loss, skin rash, eye pain, sinus congestion and sinus pain, sore throat, dysphagia,  hemoptysis , cough, dyspnea, wheezing, chest pain, palpitations, orthopnea, edema, abdominal pain, nausea, melena, diarrhea, constipation, flank pain, dysuria, hematuria, urinary  Frequency, nocturia, numbness, tingling, seizures,  Focal weakness, Loss of consciousness,  Tremor, insomnia, depression, anxiety, and suicidal ideation.      Objective:  BP  118/76 (BP Location: Left Arm, Patient Position: Sitting, Cuff Size: Large)   Pulse 78   Temp 98.3 F (36.8 C) (Oral)   Resp 16   Ht 6' (1.829 m)   Wt 293 lb 3.2 oz (133 kg)   SpO2 95%   BMI 39.77 kg/m   BP Readings from Last 3 Encounters:  03/31/17 118/76  02/24/17 (!) 120/92  12/30/16 (!) 138/94    Wt Readings from Last 3 Encounters:  03/31/17 293 lb 3.2 oz (133 kg)  02/24/17 292 lb 6.4 oz (132.6 kg)  12/30/16 287 lb 6.4 oz (130.4 kg)    General appearance: alert,  cooperative and appears stated age Ears: normal TM's and external ear canals both ears Throat: lips, mucosa, and tongue normal; teeth and gums normal Neck: no adenopathy, no carotid bruit, supple, symmetrical, trachea midline and thyroid not enlarged, symmetric, no tenderness/mass/nodules Back: symmetric, no curvature. ROM normal. No CVA tenderness. Lungs: clear to auscultation bilaterally Heart: regular rate and rhythm, S1, S2 normal, no murmur, click, rub or gallop Abdomen: soft, non-tender; bowel sounds normal; no masses,  no organomegaly Pulses: 2+ and symmetric Skin: Skin color, texture, turgor normal. No rashes or lesions Lymph nodes: Cervical, supraclavicular, and axillary nodes normal.  Lab Results  Component Value Date   HGBA1C 8.2 (H) 04/02/2017   HGBA1C 6.8 (H) 10/02/2016   HGBA1C 6.9 (H) 01/24/2016    Lab Results  Component Value Date   CREATININE 0.83 04/02/2017   CREATININE 0.92 12/30/2016   CREATININE 0.82 11/26/2016    Lab Results  Component Value Date   WBC 7.2 11/26/2016   HGB 13.4 11/26/2016   HCT 38.2 11/26/2016   PLT 198 11/26/2016   GLUCOSE 161 (H) 04/02/2017   CHOL 202 (H) 04/02/2017   TRIG 241.0 (H) 04/02/2017   HDL 31.00 (L) 04/02/2017   LDLDIRECT 144.0 04/02/2017   LDLCALC 137 (H) 07/05/2015   ALT 34 04/02/2017   AST 24 04/02/2017   NA 138 04/02/2017   K 3.8 04/02/2017   CL 105 04/02/2017   CREATININE 0.83 04/02/2017   BUN 14 04/02/2017   CO2 28 04/02/2017   TSH 1.65 07/31/2012   HGBA1C 8.2 (H) 04/02/2017   MICROALBUR 1.4 10/02/2016    No results found.  Assessment & Plan:   Problem List Items Addressed This Visit    Chronic right shoulder pain - Primary    X rays normal.  Probable bursitis/tendonitis,  continue meloxicam and tylenol      Relevant Orders   DG Shoulder Right (Completed)   Diabetes mellitus type 2, diet-controlled ([place])    Loss of control on diet alone due to lack of exercise and weight gain current medications.   Adding saxenda for obesity and diabetes management .  will follow blood sugars and reassess once Saxenda dose has been maxed out.    Lab Results  Component Value Date   HGBA1C 8.2 (H) 04/02/2017   Lab Results  Component Value Date   MICROALBUR 1.4 10/02/2016         Relevant Medications   atorvastatin (LIPITOR) 20 MG tablet   Other Relevant Orders   Hemoglobin A1c (Completed)   Dyslipidemia (high LDL; low HDL)    LDL and triglycerides are not  at goal on current medications. He is taking simvastatin,  has no side effects and liver enzymes are normal. Will change to atorvastatin   Lab Results  Component Value Date   CHOL 202 (H) 04/02/2017   HDL 31.00 (L) 04/02/2017  LDLCALC 137 (H) 07/05/2015   LDLDIRECT 144.0 04/02/2017   TRIG 241.0 (H) 04/02/2017   CHOLHDL 7 04/02/2017          Relevant Medications   atorvastatin (LIPITOR) 20 MG tablet   Other Relevant Orders   Lipid panel (Completed)   Obesity (BMI 30-39.9)   Relevant Medications   Liraglutide -Weight Management (SAXENDA) 18 MG/3ML SOPN   Other Relevant Orders   Comp Met (CMET) (Completed)      I have discontinued Mr. Liggett's aspirin EC and traMADol. I am also having him start on Liraglutide -Weight Management and atorvastatin. Additionally, I am having him maintain his omeprazole, PARoxetine, losartan, ALPRAZolam, amLODipine, ibuprofen, eszopiclone, and meloxicam.  Meds ordered this encounter  Medications  . Liraglutide -Weight Management (SAXENDA) 18 MG/3ML SOPN    Sig: Inject 0.6 mg into the skin daily. Increase dose weekly as follows: Week 2: 1.2 mg daily ; Week 3: 1.8 mg daily; Week 4: 2.4 mg daily    Dispense:  9 mL    Refill:  0  . atorvastatin (LIPITOR) 20 MG tablet    Sig: Take 1 tablet (20 mg total) by mouth daily.    Dispense:  90 tablet    Refill:  3    Medications Discontinued During This Encounter  Medication Reason  . traMADol (ULTRAM) 50 MG tablet Patient has not taken in last 30 days   . aspirin EC 81 MG tablet Patient has not taken in last 30 days    Follow-up: Return in about 3 months (around 07/01/2017).   Crecencio Mc, MD

## 2017-04-01 ENCOUNTER — Encounter: Payer: Self-pay | Admitting: Internal Medicine

## 2017-04-02 ENCOUNTER — Other Ambulatory Visit: Payer: Self-pay | Admitting: Family Medicine

## 2017-04-02 ENCOUNTER — Other Ambulatory Visit: Payer: Self-pay | Admitting: Internal Medicine

## 2017-04-02 ENCOUNTER — Other Ambulatory Visit (INDEPENDENT_AMBULATORY_CARE_PROVIDER_SITE_OTHER): Payer: BLUE CROSS/BLUE SHIELD

## 2017-04-02 ENCOUNTER — Other Ambulatory Visit: Payer: Self-pay | Admitting: Family

## 2017-04-02 DIAGNOSIS — E119 Type 2 diabetes mellitus without complications: Secondary | ICD-10-CM | POA: Diagnosis not present

## 2017-04-02 DIAGNOSIS — M2550 Pain in unspecified joint: Secondary | ICD-10-CM

## 2017-04-02 DIAGNOSIS — G8929 Other chronic pain: Secondary | ICD-10-CM | POA: Insufficient documentation

## 2017-04-02 DIAGNOSIS — E785 Hyperlipidemia, unspecified: Secondary | ICD-10-CM | POA: Diagnosis not present

## 2017-04-02 DIAGNOSIS — E669 Obesity, unspecified: Secondary | ICD-10-CM

## 2017-04-02 DIAGNOSIS — M25511 Pain in right shoulder: Principal | ICD-10-CM

## 2017-04-02 LAB — COMPREHENSIVE METABOLIC PANEL
ALT: 34 U/L (ref 0–53)
AST: 24 U/L (ref 0–37)
Albumin: 3.8 g/dL (ref 3.5–5.2)
Alkaline Phosphatase: 62 U/L (ref 39–117)
BUN: 14 mg/dL (ref 6–23)
CALCIUM: 9.4 mg/dL (ref 8.4–10.5)
CHLORIDE: 105 meq/L (ref 96–112)
CO2: 28 meq/L (ref 19–32)
CREATININE: 0.83 mg/dL (ref 0.40–1.50)
GFR: 107.33 mL/min (ref >60.00)
GLUCOSE: 161 mg/dL — AB (ref 70–99)
Potassium: 3.8 mEq/L (ref 3.5–5.1)
Sodium: 138 mEq/L (ref 135–145)
Total Bilirubin: 0.4 mg/dL (ref 0.2–1.2)
Total Protein: 7.2 g/dL (ref 6.0–8.3)

## 2017-04-02 LAB — LIPID PANEL
CHOL/HDL RATIO: 7
Cholesterol: 202 mg/dL — ABNORMAL HIGH (ref 0–200)
HDL: 31 mg/dL — AB (ref >39.00)
NONHDL: 171.08
TRIGLYCERIDES: 241 mg/dL — AB (ref 0.0–149.0)
VLDL: 48.2 mg/dL — ABNORMAL HIGH (ref 0.0–40.0)

## 2017-04-02 LAB — LDL CHOLESTEROL, DIRECT: Direct LDL: 144 mg/dL

## 2017-04-02 LAB — HEMOGLOBIN A1C: HEMOGLOBIN A1C: 8.2 % — AB (ref 4.6–6.5)

## 2017-04-02 MED ORDER — ATORVASTATIN CALCIUM 20 MG PO TABS: 20.0000 mg | ORAL_TABLET | Freq: Every day | ORAL | 3 refills | Status: DC

## 2017-04-02 NOTE — Assessment & Plan Note (Signed)
LDL and triglycerides are not  at goal on current medications. He is taking simvastatin,  has no side effects and liver enzymes are normal. Will change to atorvastatin   Lab Results  Component Value Date   CHOL 202 (H) 04/02/2017   HDL 31.00 (L) 04/02/2017   LDLCALC 137 (H) 07/05/2015   LDLDIRECT 144.0 04/02/2017   TRIG 241.0 (H) 04/02/2017   CHOLHDL 7 04/02/2017

## 2017-04-02 NOTE — Assessment & Plan Note (Signed)
X rays normal.  Probable bursitis/tendonitis,  continue meloxicam and tylenol

## 2017-04-02 NOTE — Assessment & Plan Note (Addendum)
Loss of control on diet alone due to lack of exercise and weight gain current medications.  Adding saxenda for obesity and diabetes management .  will follow blood sugars and reassess once Saxenda dose has been maxed out.    Lab Results  Component Value Date   HGBA1C 8.2 (H) 04/02/2017   Lab Results  Component Value Date   MICROALBUR 1.4 10/02/2016

## 2017-04-03 MED ORDER — MELOXICAM 7.5 MG PO TABS: 7.5000 mg | ORAL_TABLET | Freq: Every day | ORAL | 2 refills | Status: DC

## 2017-04-03 MED ORDER — AMLODIPINE BESYLATE 2.5 MG PO TABS: 2.5000 mg | ORAL_TABLET | Freq: Every day | ORAL | 2 refills | Status: DC

## 2017-04-03 MED ORDER — ESZOPICLONE 2 MG PO TABS: ORAL_TABLET | ORAL | 1 refills | Status: DC

## 2017-04-03 NOTE — Telephone Encounter (Signed)
lunesta refilled 

## 2017-04-03 NOTE — Telephone Encounter (Signed)
Refilled: 01/07/2017 Last OV: 03/31/2017 Next OV: 07/03/2017

## 2017-04-03 NOTE — Telephone Encounter (Signed)
Printed, signed and faxed.  

## 2017-04-04 ENCOUNTER — Encounter: Payer: Self-pay | Admitting: Internal Medicine

## 2017-04-14 ENCOUNTER — Telehealth: Payer: Self-pay

## 2017-04-14 NOTE — Telephone Encounter (Signed)
PA for saxenda has been approved through 04/09/2017 - 08/12/2017.  Pharmacy has been notified as well as pt.

## 2017-04-14 NOTE — Telephone Encounter (Signed)
Error

## 2017-04-15 NOTE — Telephone Encounter (Signed)
Mailed unread message to patient.  

## 2017-04-18 NOTE — Telephone Encounter (Signed)
Mailed unread message to patient, thanks 

## 2017-05-17 ENCOUNTER — Other Ambulatory Visit: Payer: Self-pay | Admitting: Internal Medicine

## 2017-05-17 DIAGNOSIS — E669 Obesity, unspecified: Secondary | ICD-10-CM

## 2017-06-25 ENCOUNTER — Other Ambulatory Visit: Payer: Self-pay | Admitting: Internal Medicine

## 2017-06-25 DIAGNOSIS — E669 Obesity, unspecified: Secondary | ICD-10-CM

## 2017-07-03 ENCOUNTER — Encounter: Payer: Self-pay | Admitting: Internal Medicine

## 2017-07-03 ENCOUNTER — Ambulatory Visit (INDEPENDENT_AMBULATORY_CARE_PROVIDER_SITE_OTHER): Payer: BLUE CROSS/BLUE SHIELD | Admitting: Internal Medicine

## 2017-07-03 VITALS — BP 118/74 | HR 68 | Temp 98.3°F | Resp 15 | Ht 72.0 in | Wt 272.0 lb

## 2017-07-03 DIAGNOSIS — F411 Generalized anxiety disorder: Secondary | ICD-10-CM

## 2017-07-03 DIAGNOSIS — E669 Obesity, unspecified: Secondary | ICD-10-CM

## 2017-07-03 DIAGNOSIS — Z23 Encounter for immunization: Secondary | ICD-10-CM

## 2017-07-03 DIAGNOSIS — E785 Hyperlipidemia, unspecified: Secondary | ICD-10-CM

## 2017-07-03 DIAGNOSIS — E1165 Type 2 diabetes mellitus with hyperglycemia: Secondary | ICD-10-CM | POA: Diagnosis not present

## 2017-07-03 LAB — COMPREHENSIVE METABOLIC PANEL WITH GFR
ALT: 24 U/L (ref 0–53)
AST: 20 U/L (ref 0–37)
Albumin: 4 g/dL (ref 3.5–5.2)
Alkaline Phosphatase: 52 U/L (ref 39–117)
BUN: 17 mg/dL (ref 6–23)
CO2: 29 meq/L (ref 19–32)
Calcium: 9.5 mg/dL (ref 8.4–10.5)
Chloride: 103 meq/L (ref 96–112)
Creatinine, Ser: 0.88 mg/dL (ref 0.40–1.50)
GFR: 100.21 mL/min
Glucose, Bld: 114 mg/dL — ABNORMAL HIGH (ref 70–99)
Potassium: 3.9 meq/L (ref 3.5–5.1)
Sodium: 138 meq/L (ref 135–145)
Total Bilirubin: 0.5 mg/dL (ref 0.2–1.2)
Total Protein: 7.5 g/dL (ref 6.0–8.3)

## 2017-07-03 LAB — LIPID PANEL
CHOL/HDL RATIO: 6
Cholesterol: 205 mg/dL — ABNORMAL HIGH (ref 0–200)
HDL: 32.8 mg/dL — AB (ref >39.00)
LDL CALC: 132 mg/dL — AB (ref 0–99)
NONHDL: 171.74
Triglycerides: 200 mg/dL — ABNORMAL HIGH (ref 0.0–149.0)
VLDL: 40 mg/dL (ref 0.0–40.0)

## 2017-07-03 LAB — HEMOGLOBIN A1C: HEMOGLOBIN A1C: 6.3 % (ref 4.6–6.5)

## 2017-07-03 MED ORDER — LIRAGLUTIDE -WEIGHT MANAGEMENT 18 MG/3ML ~~LOC~~ SOPN: 3.0000 mg | PEN_INJECTOR | Freq: Every day | SUBCUTANEOUS | 2 refills | Status: DC

## 2017-07-03 NOTE — Patient Instructions (Addendum)
Increase  dose of paxil to 20 mg after 2 weeks if symptoms not improved,  And tolerating medication   We may decide to switch to wellbutrin if your energy level does not improve with paxil or if you gain weight on paxil   Change bedtime  to 10 PM so you can get up at 6 to exercise.    Diabetes Mellitus and Exercise Exercising regularly is important for your overall health, especially when you have diabetes (diabetes mellitus). Exercising is not only about losing weight. It has many health benefits, such as increasing muscle strength and bone density and reducing body fat and stress. This leads to improved fitness, flexibility, and endurance, all of which result in better overall health. Exercise has additional benefits for people with diabetes, including:  Reducing appetite.  Helping to lower and control blood glucose.  Lowering blood pressure.  Helping to control amounts of fatty substances (lipids) in the blood, such as cholesterol and triglycerides.  Helping the body to respond better to insulin (improving insulin sensitivity).  Reducing how much insulin the body needs.  Decreasing the risk for heart disease by: ? Lowering cholesterol and triglyceride levels. ? Increasing the levels of good cholesterol. ? Lowering blood glucose levels.  What is my activity plan? Your health care provider or certified diabetes educator can help you make a plan for the type and frequency of exercise (activity plan) that works for you. Make sure that you:  Do at least 150 minutes of moderate-intensity or vigorous-intensity exercise each week. This could be brisk walking, biking, or water aerobics. ? Do stretching and strength exercises, such as yoga or weightlifting, at least 2 times a week. ? Spread out your activity over at least 3 days of the week.  Get some form of physical activity every day. ? Do not go more than 2 days in a row without some kind of physical activity. ? Avoid being inactive  for more than 90 minutes at a time. Take frequent breaks to walk or stretch.  Choose a type of exercise or activity that you enjoy, and set realistic goals.  Start slowly, and gradually increase the intensity of your exercise over time.  What do I need to know about managing my diabetes?  Check your blood glucose before and after exercising. ? If your blood glucose is higher than 240 mg/dL (40.9 mmol/L) before you exercise, check your urine for ketones. If you have ketones in your urine, do not exercise until your blood glucose returns to normal.  Know the symptoms of low blood glucose (hypoglycemia) and how to treat it. Your risk for hypoglycemia increases during and after exercise. Common symptoms of hypoglycemia can include: ? Hunger. ? Anxiety. ? Sweating and feeling clammy. ? Confusion. ? Dizziness or feeling light-headed. ? Increased heart rate or palpitations. ? Blurry vision. ? Tingling or numbness around the mouth, lips, or tongue. ? Tremors or shakes. ? Irritability.  Keep a rapid-acting carbohydrate snack available before, during, and after exercise to help prevent or treat hypoglycemia.  Avoid injecting insulin into areas of the body that are going to be exercised. For example, avoid injecting insulin into: ? The arms, when playing tennis. ? The legs, when jogging.  Keep records of your exercise habits. Doing this can help you and your health care provider adjust your diabetes management plan as needed. Write down: ? Food that you eat before and after you exercise. ? Blood glucose levels before and after you exercise. ? The type  and amount of exercise you have done. ? When your insulin is expected to peak, if you use insulin. Avoid exercising at times when your insulin is peaking.  When you start a new exercise or activity, work with your health care provider to make sure the activity is safe for you, and to adjust your insulin, medicines, or food intake as  needed.  Drink plenty of water while you exercise to prevent dehydration or heat stroke. Drink enough fluid to keep your urine clear or pale yellow. This information is not intended to replace advice given to you by your health care provider. Make sure you discuss any questions you have with your health care provider. Document Released: 11/16/2003 Document Revised: 03/15/2016 Document Reviewed: 02/05/2016 Elsevier Interactive Patient Education  2018 [employer]

## 2017-07-03 NOTE — Progress Notes (Signed)
Subjective:  Patient ID: Mark Welch, male    DOB: 09-Oct-1975  Age: 41 y.o. MRN: [id number]  CC: The primary encounter diagnosis was Dyslipidemia (high LDL; low HDL). Diagnoses of Need for immunization against influenza, Uncontrolled type 2 diabetes mellitus with hyperglycemia (HCC), Obesity (BMI 30-39.9), and Generalized anxiety disorder were also pertinent to this visit.  HPI Mark Welch presents for follow  Up on  multiple issues including depression with anxiety, managed with paxil and alprazolam   Depression;  Stopped the paxil for a while when he started the saxenda and when his rx ran out. Symptoms of anhedonia, fatigue  And anxiety became pervasive, and he has resumed it in the past week. .    Obesity and diabetes,  Has lost 21 lbs since July ,  Which is 7% of initial body weight using Saxenda daily in the afternoon .  Taking 2.4 mg  Daily  Weight has plateaued recently because he stopped going to the gym at night.  He has been trying  to transition to morning  But having a hard time.  normally rises at 8:00 and work by [employer] better ,  Told he snores but has no diagnosis of sleep apnea.  Has deferred the study, goes to bed around 11:30 pm.  Waking up feeling more rested since  he lost the weight     Eye exam  Nov 2017.  Has not been checking sugars despite uncontrolled Diabetes.   Lab Results  Component Value Date   HGBA1C 6.3 07/03/2017   Lab Results  Component Value Date   MICROALBUR 1.4 10/02/2016     Outpatient Medications Prior to Visit  Medication Sig Dispense Refill  . ALPRAZolam (XANAX) 0.5 MG tablet TAKE 1 TABLET BY MOUTH EVERY DAY AS NEEDED FOR ANXIETY 30 tablet 4  . amLODipine (NORVASC) 2.5 MG tablet Take 1 tablet (2.5 mg total) by mouth daily. 90 tablet 2  . eszopiclone (LUNESTA) 2 MG TABS tablet TAKE ONE TABLET BY MOUTH IMMEDIATELY BEFORE BEDTIME. 90 tablet 1  . ibuprofen (ADVIL,MOTRIN) 800 MG tablet Take 1 tablet (800 mg total)  by mouth every 8 (eight) hours as needed. 90 tablet 1  . losartan (COZAAR) 100 MG tablet TAKE 1 TABLET (100 MG TOTAL) BY MOUTH DAILY. 90 tablet 2  . meloxicam (MOBIC) 7.5 MG tablet Take 1 tablet (7.5 mg total) by mouth daily. 30 tablet 2  . omeprazole (PRILOSEC) 40 MG capsule TAKE ONE CAPSULE BY MOUTH ONE TIME DAILY 90 capsule 1  . PARoxetine (PAXIL) 10 MG tablet TAKE ONE TABLET BY MOUTH ONE TIME DAILY WITH FOOD . INCREASE TO TWO TABLETS AFTER ONE MONTH. 90 tablet 0  . SAXENDA 18 MG/3ML SOPN inject 0.6mg  into the skin daily. increase dose weekly as follows: week 2: 1.2mg  daily; week 3: 1.8 mg daily; week 4: 2.4mg  daily 9 mL 0  . atorvastatin (LIPITOR) 20 MG tablet Take 1 tablet (20 mg total) by mouth daily. (Patient not taking: Reported on 07/03/2017) 90 tablet 3   No facility-administered medications prior to visit.     Review of Systems;  Patient denies headache, fevers, malaise, unintentional weight loss, skin rash, eye pain, sinus congestion and sinus pain, sore throat, dysphagia,  hemoptysis , cough, dyspnea, wheezing, chest pain, palpitations, orthopnea, edema, abdominal pain, nausea, melena, diarrhea, constipation, flank pain, dysuria, hematuria, urinary  Frequency, nocturia, numbness, tingling, seizures,  Focal weakness, Loss of consciousness,  Tremor, insomnia, depression, anxiety, and suicidal ideation.  Objective:  BP 118/74 (BP Location: Left Arm, Patient Position: Sitting, Cuff Size: Large)   Pulse 68   Temp 98.3 F (36.8 C) (Oral)   Resp 15   Ht 6' (1.829 m)   Wt 272 lb (123.4 kg)   SpO2 97%   BMI 36.89 kg/m   BP Readings from Last 3 Encounters:  07/03/17 118/74  03/31/17 118/76  02/24/17 (!) 120/92    Wt Readings from Last 3 Encounters:  07/03/17 272 lb (123.4 kg)  03/31/17 293 lb 3.2 oz (133 kg)  02/24/17 292 lb 6.4 oz (132.6 kg)    General appearance: alert, cooperative and appears stated age Ears: normal TM's and external ear canals both ears Throat:  lips, mucosa, and tongue normal; teeth and gums normal Neck: no adenopathy, no carotid bruit, supple, symmetrical, trachea midline and thyroid not enlarged, symmetric, no tenderness/mass/nodules Back: symmetric, no curvature. ROM normal. No CVA tenderness. Lungs: clear to auscultation bilaterally Heart: regular rate and rhythm, S1, S2 normal, no murmur, click, rub or gallop Abdomen: soft, non-tender; bowel sounds normal; no masses,  no organomegaly Pulses: 2+ and symmetric Skin: Skin color, texture, turgor normal. No rashes or lesions Lymph nodes: Cervical, supraclavicular, and axillary nodes normal.  Lab Results  Component Value Date   HGBA1C 6.3 07/03/2017   HGBA1C 8.2 (H) 04/02/2017   HGBA1C 6.8 (H) 10/02/2016    Lab Results  Component Value Date   CREATININE 0.88 07/03/2017   CREATININE 0.83 04/02/2017   CREATININE 0.92 12/30/2016    Lab Results  Component Value Date   WBC 7.2 11/26/2016   HGB 13.4 11/26/2016   HCT 38.2 11/26/2016   PLT 198 11/26/2016   GLUCOSE 114 (H) 07/03/2017   CHOL 205 (H) 07/03/2017   TRIG 200.0 (H) 07/03/2017   HDL 32.80 (L) 07/03/2017   LDLDIRECT 144.0 04/02/2017   LDLCALC 132 (H) 07/03/2017   ALT 24 07/03/2017   AST 20 07/03/2017   NA 138 07/03/2017   K 3.9 07/03/2017   CL 103 07/03/2017   CREATININE 0.88 07/03/2017   BUN 17 07/03/2017   CO2 29 07/03/2017   TSH 1.65 07/31/2012   HGBA1C 6.3 07/03/2017   MICROALBUR 1.4 10/02/2016    No results found.  Assessment & Plan:   Problem List Items Addressed This Visit    Diabetes mellitus type 2, uncontrolled (HCC)    He has regained control with use of Saxenda  But his weight has plateaued due to lack of exercise, No chanfes to medication,  Encouraged to exercise .   Lab Results  Component Value Date   HGBA1C 6.3 07/03/2017   Lab Results  Component Value Date   MICROALBUR 1.4 10/02/2016         Relevant Orders   Hemoglobin A1c (Completed)   Comprehensive metabolic panel  (Completed)   Dyslipidemia (high LDL; low HDL) - Primary    LDL and triglycerides are not  at goal on current medications.therapy was changed from simvastatin to atorvastatin , and liver enzymes are normal.  Lab Results  Component Value Date   CHOL 205 (H) 07/03/2017   HDL 32.80 (L) 07/03/2017   LDLCALC 132 (H) 07/03/2017   LDLDIRECT 144.0 04/02/2017   TRIG 200.0 (H) 07/03/2017   CHOLHDL 6 07/03/2017          Relevant Orders   Lipid panel (Completed)   Generalized anxiety disorder    Patient has resumed paxil for symptoms of anxiety and depression  He will increase his dose to  20 mg in two weeks if he continues to endorse untreated anxiety , anhedonia and social avoidance. Discussed alternative vs complementary therapy with wellbutrin if fatigue predominates       Obesity (BMI 30-39.9)   Relevant Medications   Liraglutide -Weight Management (SAXENDA) 18 MG/3ML SOPN    Other Visit Diagnoses    Need for immunization against influenza       Relevant Orders   Flu Vaccine QUAD 36+ mos IM (Completed)     A total of 25 minutes of face to face time was spent with patient more than half of which was spent in counselling about the above mentioned conditions  and coordination of care  I have changed Mr. Kelsay's SAXENDA to Liraglutide -Weight Management. I am also having him maintain his losartan, ALPRAZolam, ibuprofen, amLODipine, meloxicam, omeprazole, atorvastatin, eszopiclone, and PARoxetine.  Meds ordered this encounter  Medications  . Liraglutide -Weight Management (SAXENDA) 18 MG/3ML SOPN    Sig: Inject 3 mg into the skin daily.    Dispense:  9 mL    Refill:  2    Medications Discontinued During This Encounter  Medication Reason  . SAXENDA 18 MG/3ML SOPN Reorder    Follow-up: Return in about 3 months (around 10/03/2017).   Sherlene ShamsULLO, Kemarion Abbey L, MD

## 2017-07-04 ENCOUNTER — Telehealth: Payer: Self-pay | Admitting: *Deleted

## 2017-07-04 NOTE — Telephone Encounter (Signed)
Mark Welch (Key: [id number])

## 2017-07-04 NOTE — Telephone Encounter (Signed)
-----   Message from Sherlene Shamseresa L Tullo, MD sent at 07/03/2017  9:51 AM EDT ----- saxenda renewal,  Has lost 7% of body weight (21 lbs /293)

## 2017-07-05 NOTE — Assessment & Plan Note (Signed)
Patient has resumed paxil for symptoms of anxiety and depression  He will increase his dose to 20 mg in two weeks if he continues to endorse untreated anxiety , anhedonia and social avoidance. Discussed alternative vs complementary therapy with wellbutrin if fatigue predominates

## 2017-07-05 NOTE — Assessment & Plan Note (Addendum)
He has regained control with use of Saxenda  But his weight has plateaued due to lack of exercise, No chanfes to medication,  Encouraged to exercise .   Lab Results  Component Value Date   HGBA1C 6.3 07/03/2017   Lab Results  Component Value Date   MICROALBUR 1.4 10/02/2016

## 2017-07-05 NOTE — Assessment & Plan Note (Signed)
LDL and triglycerides are not  at goal on current medications.therapy was changed from simvastatin to atorvastatin , and liver enzymes are normal.  Lab Results  Component Value Date   CHOL 205 (H) 07/03/2017   HDL 32.80 (L) 07/03/2017   LDLCALC 132 (H) 07/03/2017   LDLDIRECT 144.0 04/02/2017   TRIG 200.0 (H) 07/03/2017   CHOLHDL 6 07/03/2017

## 2017-07-21 NOTE — Progress Notes (Signed)
07/22/2017 9:46 PM   Mark Welch 1975-09-22 [id number]  Referring provider: Sherlene Shamsullo, Teresa L, MD [address] [place], [place] [id number]  Chief Complaint  Patient presents with  . Erectile Dysfunction  . Benign Prostatic Hypertrophy    HPI: Patient is a 41 year old Hispanic male with DM and a history of nephrolithiasis who presents today for the complaint of ED.    History of nephrolithiasis Non contrast CT performed in 07/24/2011 noted a nonobstructing left nephrolithiasis.   Repeated non contrast performed in 11/2016 noted small stones in the 2-4 mm range are present in both kidneys. Neither kidney is hydronephrotic, both ureters are normal.  Urinary bladder.  Normal urinary bladder.   Stone composition was unknown.  He is not experiencing any renal colic or gross hematuria at this time.    Erectile dysfunction His SHIM score is 10, which is moderate ED.   He has been having difficulty with erections for a few years.   His major complaint is not firm enough for penetration.  His libido is preserved.   His risk factors for ED are age, BPH, DM, HTN, HLD and sleep apnea, anxiety, depression and antidepressants.  He denies any painful erections or curvatures with his erections.   He is still having spontaneous erections.  He has tried Cialis in the past, but he finds the medication cost prohibitive.   SHIM    Row Name 07/22/17 1517         SHIM: Over the last 6 months:   How do you rate your confidence that you could get and keep an erection?  Very Low     When you had erections with sexual stimulation, how often were your erections hard enough for penetration (entering your partner)?  A Few Times (much less than half the time)     During sexual intercourse, how often were you able to maintain your erection after you had penetrated (entered) your partner?  A Few Times (much less than half the time)     During sexual intercourse, how difficult was it to  maintain your erection to completion of intercourse?  Difficult     When you attempted sexual intercourse, how often was it satisfactory for you?  A Few Times (much less than half the time)       SHIM Total Score   SHIM  10        Score: 1-7 Severe ED 8-11 Moderate ED 12-16 Mild-Moderate ED 17-21 Mild ED 22-25 No ED  BPH WITH LUTS  (prostate and/or bladder) His IPSS score today is 1, which is mild lower urinary tract symptomatology. He is pleased with his quality life due to his urinary symptoms.   His major complaint today nocturia x 1.  He has had these symptoms for several years.  He denies any dysuria, hematuria or suprapubic pain.   He also denies any recent fevers, chills, nausea or vomiting.  He does not have a family history of PCa. IPSS    Row Name 07/22/17 1500         International Prostate Symptom Score   How often have you had the sensation of not emptying your bladder?  Not at All     How often have you had to urinate less than every two hours?  Not at All     How often have you found you stopped and started again several times when you urinated?  Not at All  How often have you found it difficult to postpone urination?  Not at All     How often have you had a weak urinary stream?  Not at All     How often have you had to strain to start urination?  Not at All     How many times did you typically get up at night to urinate?  1 Time     Total IPSS Score  1       Quality of Life due to urinary symptoms   If you were to spend the rest of your life with your urinary condition just the way it is now how would you feel about that?  Pleased        Score:  1-7 Mild 8-19 Moderate 20-35 Severe   PMH: Past Medical History:  Diagnosis Date  . Anxiety   . GERD (gastroesophageal reflux disease)   . Hyperlipidemia   . Hypertension   . Insomnia   . Kidney stones   . Obesity (BMI 30-39.9)   . Seasonal allergies     Surgical History: Past Surgical History:    Procedure Laterality Date  . NO PAST SURGERIES      Home Medications:  Allergies as of 07/22/2017      Reactions   Maple Flavor Rash   Patient also states that he gets a sore throat, numb tongue.      Medication List        Accurate as of 07/22/17 11:59 PM. Always use your most recent med list.          ALPRAZolam 0.5 MG tablet Commonly known as:  XANAX TAKE 1 TABLET BY MOUTH EVERY DAY AS NEEDED FOR ANXIETY   amLODipine 2.5 MG tablet Commonly known as:  NORVASC Take 1 tablet (2.5 mg total) by mouth daily.   eszopiclone 2 MG Tabs tablet Commonly known as:  LUNESTA TAKE ONE TABLET BY MOUTH IMMEDIATELY BEFORE BEDTIME.   ibuprofen 800 MG tablet Commonly known as:  ADVIL,MOTRIN Take 1 tablet (800 mg total) by mouth every 8 (eight) hours as needed.   losartan 100 MG tablet Commonly known as:  COZAAR TAKE 1 TABLET (100 MG TOTAL) BY MOUTH DAILY.   omeprazole 40 MG capsule Commonly known as:  PRILOSEC TAKE ONE CAPSULE BY MOUTH ONE TIME DAILY   PARoxetine 10 MG tablet Commonly known as:  PAXIL TAKE ONE TABLET BY MOUTH ONE TIME DAILY WITH FOOD . INCREASE TO TWO TABLETS AFTER ONE MONTH.   sildenafil 20 MG tablet Commonly known as:  REVATIO Take 3 to 5 tablets two hours before intercouse on an empty stomach.  Do not take with nitrates.       Allergies:  Allergies  Allergen Reactions  . Maple Flavor Rash    Patient also states that he gets a sore throat, numb tongue.    Family History: Family History  Problem Relation Age of Onset  . Hypertension Mother   . Cancer Mother        ovarian  . Cancer Father        kidney  . Prostate cancer Neg Hx   . Bladder Cancer Neg Hx     Social History:  reports that  has never smoked. he has never used smokeless tobacco. He reports that he does not drink alcohol or use drugs.  ROS: UROLOGY Frequent Urination?: No Hard to postpone urination?: No Burning/pain with urination?: No Get up at night to urinate?:  No Leakage of urine?: No  Urine stream starts and stops?: No Trouble starting stream?: No Do you have to strain to urinate?: No Blood in urine?: No Urinary tract infection?: No Sexually transmitted disease?: No Injury to kidneys or bladder?: No Painful intercourse?: No Weak stream?: No Erection problems?: Yes Penile pain?: No  Gastrointestinal Nausea?: No Vomiting?: No Indigestion/heartburn?: No Diarrhea?: No Constipation?: No  Constitutional Fever: No Night sweats?: No Weight loss?: No Fatigue?: No  Skin Skin rash/lesions?: No Itching?: No  Eyes Blurred vision?: No Double vision?: No  Ears/Nose/Throat Sore throat?: No Sinus problems?: No  Hematologic/Lymphatic Swollen glands?: No Easy bruising?: No  Cardiovascular Leg swelling?: No Chest pain?: No  Respiratory Cough?: No Shortness of breath?: No  Endocrine Excessive thirst?: No  Musculoskeletal Back pain?: No Joint pain?: No  Neurological Headaches?: No Dizziness?: No  Psychologic Depression?: Yes Anxiety?: Yes  Physical Exam: BP (!) 141/83   Pulse 75   Ht 6' (1.829 m)   Wt 273 lb 12.8 oz (124.2 kg)   BMI 37.13 kg/m   Constitutional: Well nourished. Alert and oriented, No acute distress. HEENT: [place] AT, moist mucus membranes. Trachea midline, no masses. Cardiovascular: No clubbing, cyanosis, or edema. Respiratory: Normal respiratory effort, no increased work of breathing. GI: Abdomen is soft, non tender, non distended, no abdominal masses. Liver and spleen not palpable.  No hernias appreciated.  Stool sample for occult testing is not indicated.   GU: Mild left CVA tenderness.  No bladder fullness or masses.  Patient with uncircumcised phallus.  Foreskin easily retracted  Urethral meatus is patent.  No penile discharge. No penile lesions or rashes. Scrotum without lesions, cysts, rashes and/or edema.  Testicles are located scrotally bilaterally. No masses are appreciated in the testicles.  Left and right epididymis are normal. Rectal: Patient with  normal sphincter tone. Anus and perineum without scarring or rashes. No rectal masses are appreciated. Prostate is approximately 35 grams, (DRE limited to patient's body habitus), no nodules are appreciated. Seminal vesicles are normal. Skin: No rashes, bruises or suspicious lesions. Lymph: No cervical or inguinal adenopathy. Neurologic: Grossly intact, no focal deficits, moving all 4 extremities. Psychiatric: Normal mood and affect.  Laboratory Data: Lab Results  Component Value Date   WBC 7.2 11/26/2016   HGB 13.4 11/26/2016   HCT 38.2 11/26/2016   MCV 85 11/26/2016   PLT 198 11/26/2016    Lab Results  Component Value Date   CREATININE 0.88 07/03/2017     Lab Results  Component Value Date   HGBA1C 6.3 07/03/2017       Component Value Date/Time   CHOL 205 (H) 07/03/2017 0954   HDL 32.80 (L) 07/03/2017 0954   CHOLHDL 6 07/03/2017 0954   VLDL 40.0 07/03/2017 0954   LDLCALC 132 (H) 07/03/2017 0954    Lab Results  Component Value Date   AST 20 07/03/2017   Lab Results  Component Value Date   ALT 24 07/03/2017   I have reviewed the labs.  Pertinent Imaging: REASON FOR EXAM:    hx kidney stones flank pain hematuria  COMMENTS:   PROCEDURE:     KCT - KCT ABDOMEN/PELVIS WO (STONE)  - Jul 24 2011 11:06AM   RESULT:     Indication: A flank pain   Comparison: None   Technique: Multiple axial images from the lung bases to the symphysis  pubis  were obtained without oral and without intravenous contrast.   Findings:   The lung bases are clear. There is no pleural or pericardial effusions.  There is left nephrolithiasis. No obstructive uropathy. No perinephric  stranding is seen. The kidneys are symmetric in size without evidence for  exophytic mass. There is bladder wall thickening which is likely secondary  to underdistention.   The liver is diffusely low in attenuation likely secondary to  hepatic  steatosis. The gallbladder is unremarkable. The spleen demonstrates no  focal  abnormality. The adrenal glands and pancreas are normal.   The unopacified stomach, duodenum, small intestine, and large intestine  are  unremarkable, but evaluation is limited by lack of oral contrast.  There  is  no pneumoperitoneum, pneumatosis, or portal venous gas. There is no  abdominal or pelvic free fluid. There is no lymphadenopathy.   The abdominal aorta is normal in caliber.   The osseous structures are unremarkable.   IMPRESSION:   1. Nonobstructing left nephrolithiasis.    IMPRESSION:  1.Small bilateral kidney stones without evidence of obstructive uropathy.   Result Narrative  INDICATION: Calculus of kidney. COMPARISON:None. TECHNIQUE: Routine CT of the abdomen and pelvis was performed without intravenous or oral contrast per renal calculus evaluation protocol. This exam is intended to evaluate for presence of renal calculi and does not adequately assess for renal or  urothelial malignancies. Radiation dose reduction was utilized (automated exposure control, mA or kV adjustment based on patient size, or iterative image reconstruction).  FINDINGS:  VISUALIZED LOWER THORAX: Lung bases are clear, heart size is normal.  UROLOGIC: Kidneys/ureters: Small stones in the 2-4 mm range are present in both kidneys. Neither kidney is hydronephrotic, both ureters are normal. Urinary bladder:Normal urinary bladder.  SOLID VISCERA: Liver: Liver is fatty replaced with focal fatty sparing around the gallbladder fossa. Gallbladder: Normal Adrenal glands: Normal Pancreas: Normal Spleen: Normal  GI: No bowel obstruction. Normal appendix.   PERITONEAL CAVITY/RETROPERITONEUM: No free fluid. No pneumoperitoneum. No lymphadenopathy.  PELVIS: Normal prostate and seminal vesicles.  MUSCULOSKELETAL: No acute or destructive osseous processes.  Status     Assessment & Plan:     1. Erectile dysfunction  - SHIM score is 10  - I explained to the patient that in order to achieve an erection it takes good functioning of the nervous system (parasympathetic and rs, sympathetic, sensory and motor), good blood flow into the erectile tissue of the penis and a desire to have sex  - I explained that conditions like diabetes, hypertension, coronary artery disease, peripheral vascular disease, smoking, alcohol consumption, age, sleep apnea and BPH can diminish the ability to have an erection  - I explained the ED may be a risk marker for underlying CVD and he should follow up with PCP for further studies   - we will obtain a serum testosterone level and a TSH at this time; if it is abnormal we will need to repeat the study for confirmation  - A recent study published in Sex Med 2018 Apr 13 revealed moderate to vigorous aerobic exercise for 40 minutes 4 times per week can decrease erectile problems caused by physical inactivity, obesity, hypertension, metabolic syndrome and/or cardiovascular diseases  - We discussed trying a different PDE5 inhibitor  - He would like to try sildenafil - Sildenafil 20 mg, 3 to 5 tablets two hours prior to intercourse on an empty stomach, # 50; he is warned not to take medications that contain nitrates.  I also advised him of the side effects, such as: headache, flushing, dyspepsia, abnormal vision, nasal congestion, back pain, myalgia, nausea, dizziness, and rash.  - RTC in one month for  repeat SHIM score  2. History kidney stones  - patient with bilateral nephrolithiasis  - recommend yearly KUB's   Return in about 1 month (around 08/21/2017) for SHIM score.  These notes generated with voice recognition software. I apologize for typographical errors.  Michiel Cowboy, PA-C  [facility] [address], [address] [place], [place] [address] [phone]

## 2017-07-22 ENCOUNTER — Ambulatory Visit: Payer: BLUE CROSS/BLUE SHIELD | Admitting: Urology

## 2017-07-22 ENCOUNTER — Encounter: Payer: Self-pay | Admitting: Urology

## 2017-07-22 VITALS — BP 141/83 | HR 75 | Ht 72.0 in | Wt 273.8 lb

## 2017-07-22 DIAGNOSIS — Z87442 Personal history of urinary calculi: Secondary | ICD-10-CM | POA: Diagnosis not present

## 2017-07-22 DIAGNOSIS — N529 Male erectile dysfunction, unspecified: Secondary | ICD-10-CM

## 2017-07-22 MED ORDER — SILDENAFIL CITRATE 20 MG PO TABS: ORAL_TABLET | ORAL | 3 refills | Status: DC

## 2017-07-23 ENCOUNTER — Other Ambulatory Visit: Payer: Self-pay | Admitting: Family Medicine

## 2017-07-23 ENCOUNTER — Telehealth: Payer: Self-pay | Admitting: Family Medicine

## 2017-07-23 DIAGNOSIS — N529 Male erectile dysfunction, unspecified: Secondary | ICD-10-CM

## 2017-07-23 LAB — TSH: TSH: 1.49 u[IU]/mL (ref 0.450–4.500)

## 2017-07-23 LAB — TESTOSTERONE: TESTOSTERONE: 270 ng/dL (ref 264–916)

## 2017-07-23 NOTE — Telephone Encounter (Signed)
-----   Message from Harle BattiestShannon A McGowan, PA-C sent at 07/23/2017  7:42 AM EST ----- Please let Charolotte CapuchinJehovany know that his testosterone returned below 300.  We will need a specimen drawn before 10 am for conformation.

## 2017-07-23 NOTE — Telephone Encounter (Signed)
Patient notified and Testosterone ordered.

## 2017-07-23 NOTE — Progress Notes (Unsigned)
Patient notified and Testerone ordered

## 2017-07-30 ENCOUNTER — Other Ambulatory Visit: Payer: BLUE CROSS/BLUE SHIELD

## 2017-07-30 DIAGNOSIS — N529 Male erectile dysfunction, unspecified: Secondary | ICD-10-CM

## 2017-07-30 IMAGING — DX DG SHOULDER 2+V*R*
3 series · 3 of 3 positions shown · non-contrast
Comparison: None.

CLINICAL DATA: chronic right shoulder pain

EXAM:
RIGHT SHOULDER - 2+ VIEW

[shoulder (grashey) ap]
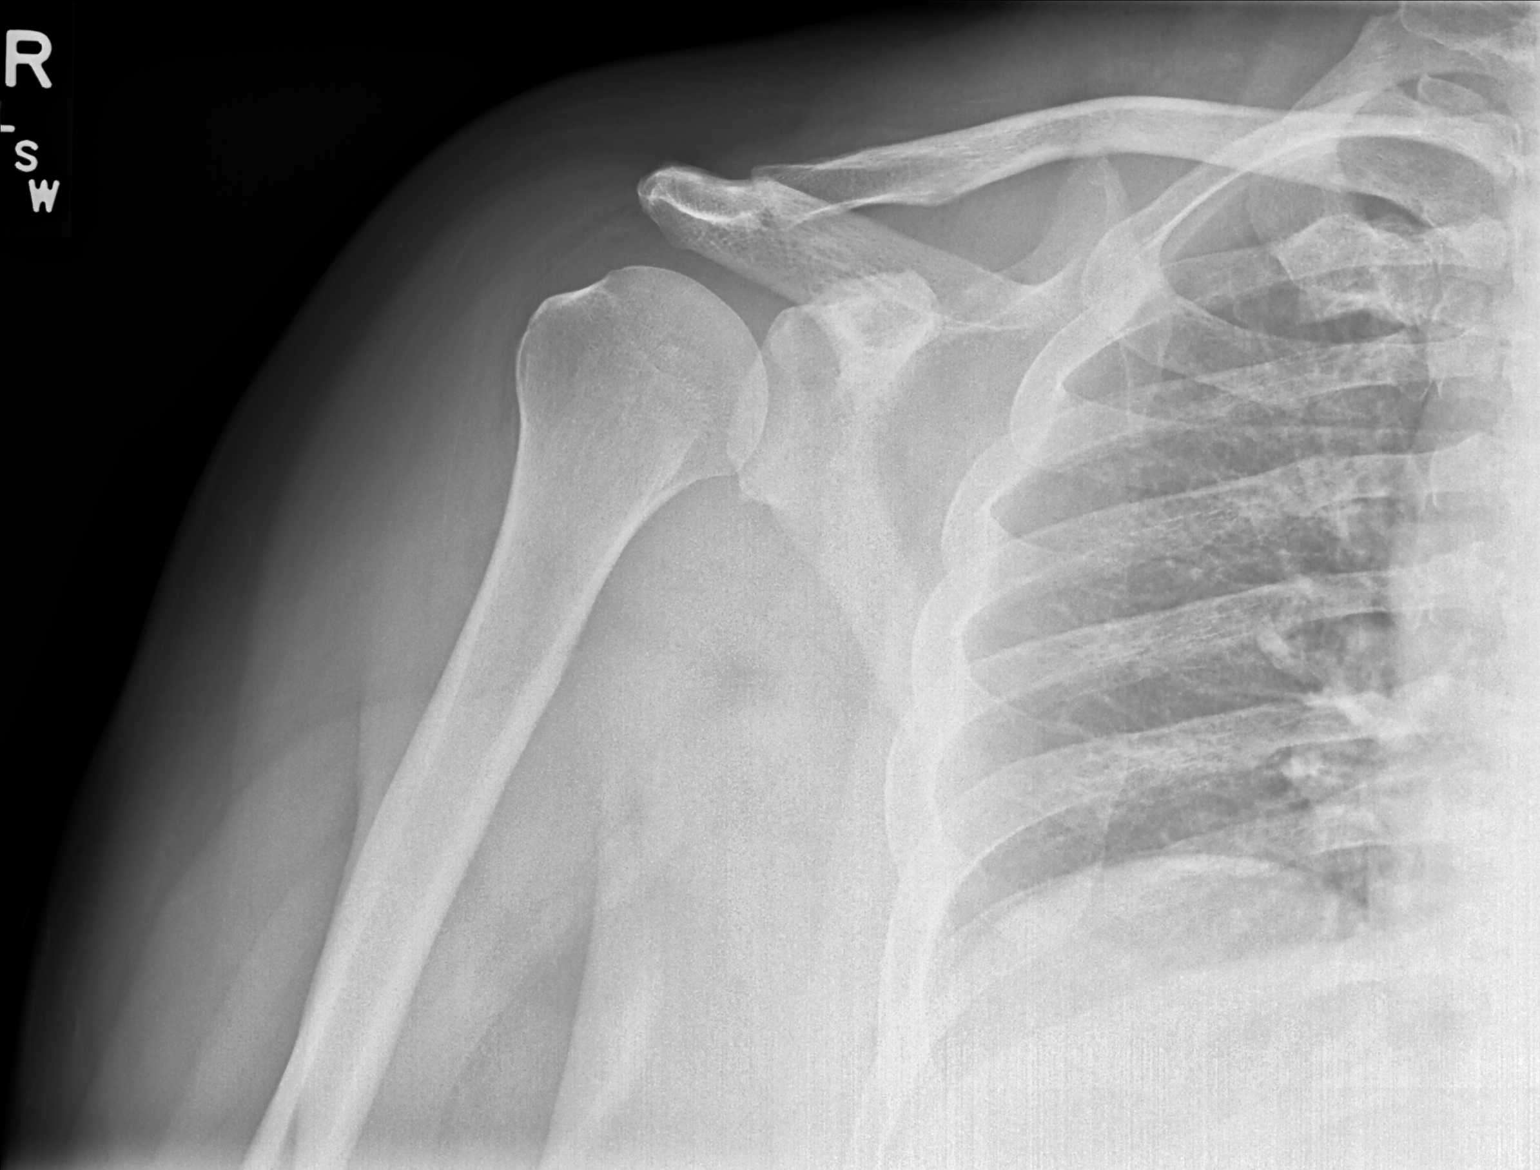

[shoulder y view]
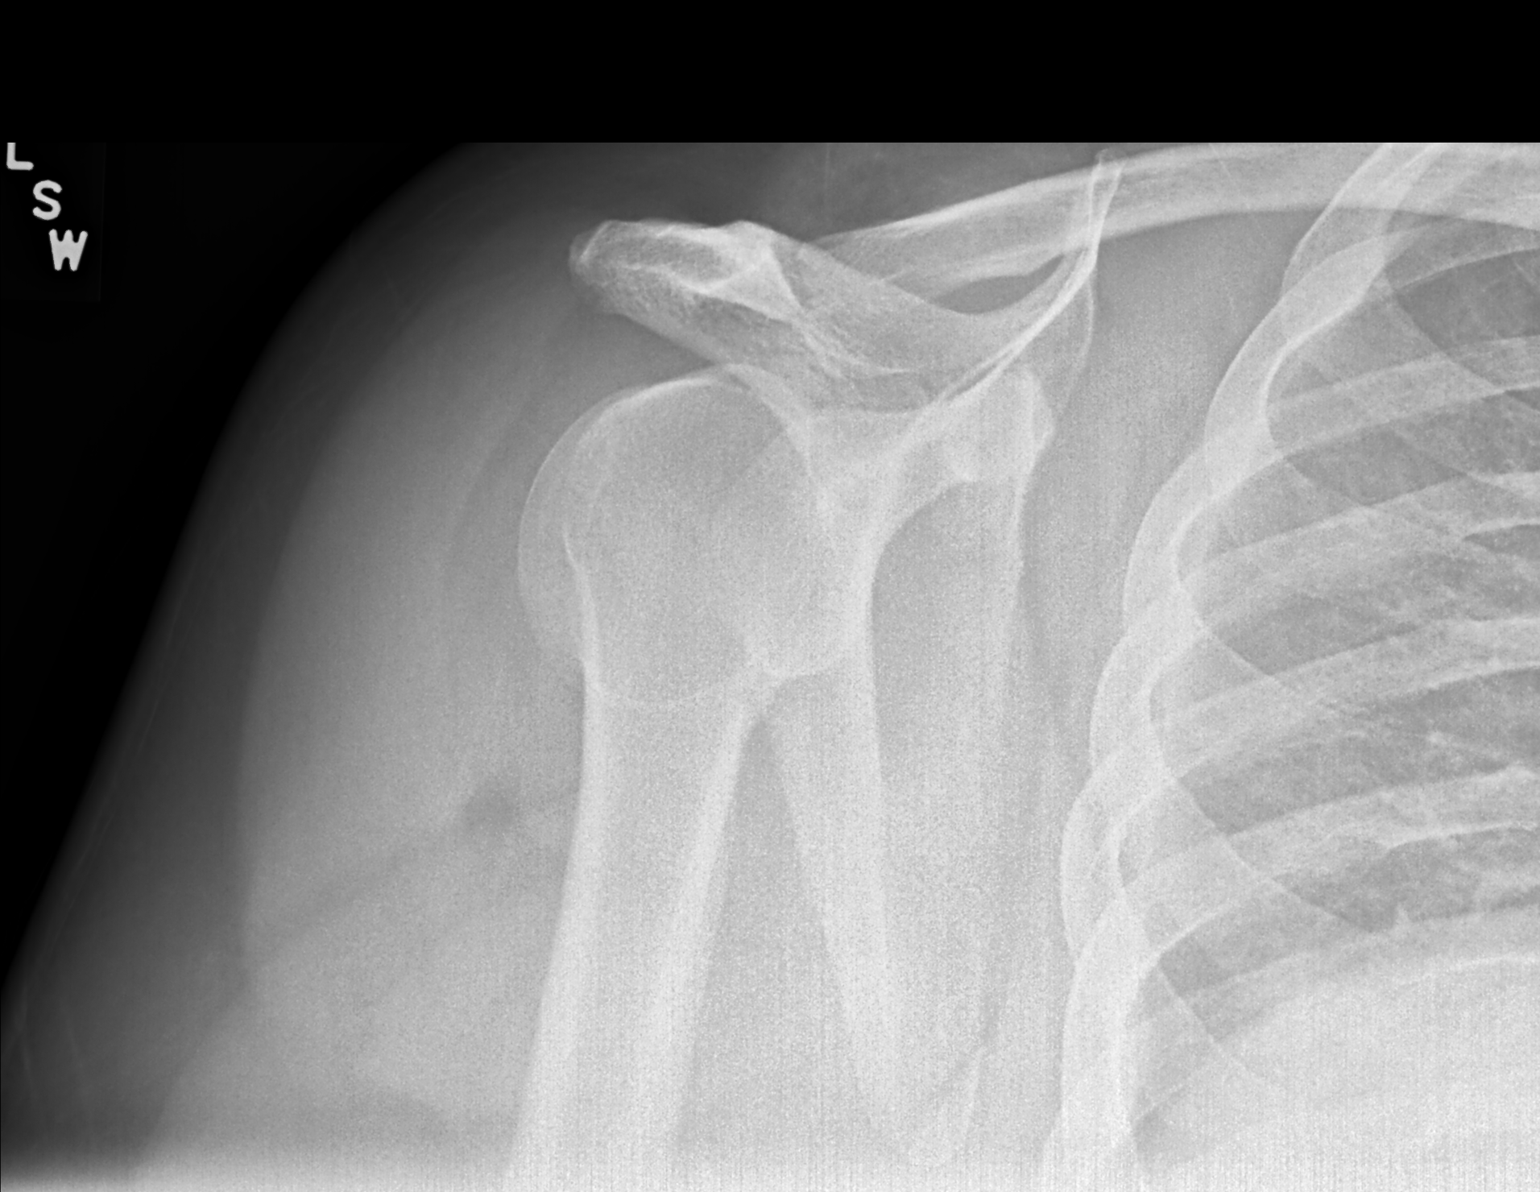

[shoulder axillary pa]
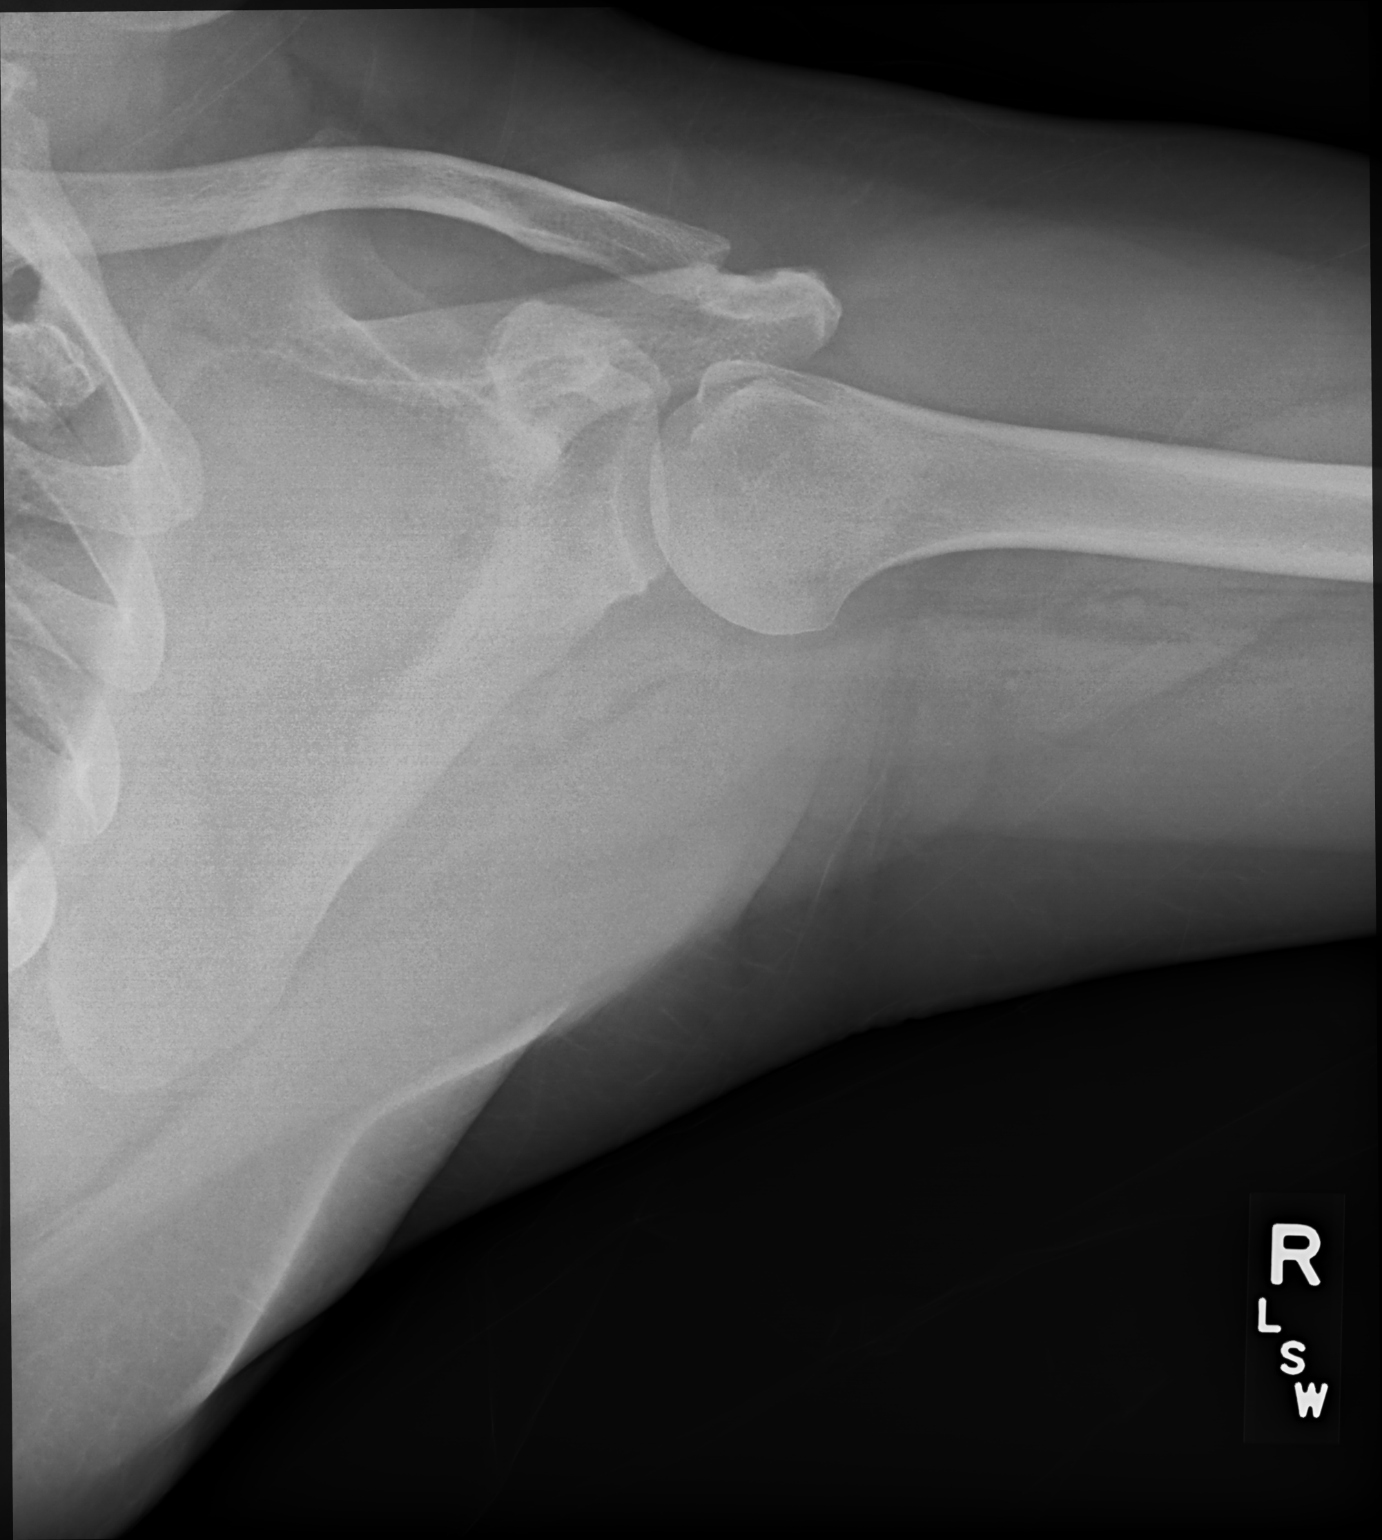

[3 of 3 positions shown; findings below may reference images not displayed]

FINDINGS: There is no evidence of fracture or dislocation. There is no
evidence of arthropathy or other focal bone abnormality. Soft
tissues are unremarkable.
IMPRESSION: Negative.

## 2017-07-31 LAB — TESTOSTERONE: Testosterone: 283 ng/dL (ref 264–916)

## 2017-08-05 ENCOUNTER — Telehealth: Payer: Self-pay

## 2017-08-05 MED ORDER — CLOMIPHENE CITRATE 50 MG PO TABS: ORAL_TABLET | ORAL | 3 refills | Status: DC

## 2017-08-05 NOTE — Telephone Encounter (Signed)
LMOM-medication sent to pharmacy 

## 2017-08-05 NOTE — Telephone Encounter (Signed)
-----   Message from Harle BattiestShannon A McGowan, PA-C sent at 08/01/2017  4:24 PM EST ----- Please let Mr. Mark Welch know that his second testosterone is low.  We need to add LH and prolactin to his blood work.  He can start Clomid 50 mg, 1/2 daily # 30 with three refills.  We will need to recheck his testosterone in one month before 10 am.

## 2017-08-06 NOTE — Telephone Encounter (Signed)
LMOM

## 2017-08-06 NOTE — Telephone Encounter (Signed)
I let pt know that you left message that Rx was sent to pharmacy

## 2017-08-09 LAB — SPECIMEN STATUS REPORT

## 2017-08-09 LAB — FSH/LH
FSH: 7.1 m[IU]/mL (ref 1.5–12.4)
LH: 6.9 m[IU]/mL (ref 1.7–8.6)

## 2017-08-09 LAB — PROLACTIN: Prolactin: 13.1 ng/mL (ref 4.0–15.2)

## 2017-08-14 ENCOUNTER — Telehealth: Payer: Self-pay | Admitting: Internal Medicine

## 2017-08-14 NOTE — Telephone Encounter (Signed)
Please advise 

## 2017-08-14 NOTE — Telephone Encounter (Signed)
Copied from CRM [id number]. Topic: General - Other >> Aug 14, 2017  2:03 PM Cecelia ByarsGreen, Temeka L, RMA wrote: Reason for CRM: Barb from cover my meds calling to check status of prior auth. For Saxenda, checking to see if request for renewal has been received, please call barb at [phone]

## 2017-08-18 NOTE — Progress Notes (Signed)
08/19/2017 3:30 PM   Charolotte Capuchin Darrick Meigs [birth year] [id number]  Referring provider: Sherlene Shams, MD [address] [place], [place] [address]  Chief Complaint  Patient presents with  . Follow-up    HPI: Patient is a 41 year old Hispanic male with DM and a history of nephrolithiasis, testosterone deficiency and ED who presents today for a one month follow up.    History of nephrolithiasis Non contrast CT performed in 07/24/2011 noted a nonobstructing left nephrolithiasis.   Repeated non contrast performed in 11/2016 noted small stones in the 2-4 mm range are present in both kidneys. Neither kidney is hydronephrotic, both ureters are normal.  Urinary bladder.  Normal urinary bladder.   Stone composition was unknown.  He is not experiencing any renal colic or gross hematuria at this time.    Erectile dysfunction His SHIM score is 11, which is moderate ED.   He has been having difficulty with erections for a few years.   His previous SHIM score was 10.  His major complaint is not firm enough for penetration.  His libido is diminished.   His risk factors for ED are age, BPH, DM, HTN, HLD, anxiety, depression and antidepressants.  He denies any painful erections or curvatures with his erections.   He is still having spontaneous erections.  He has tried Cialis in the past, but he finds the medication cost prohibitive.   SHIM    Row Name 07/22/17 1517 08/19/17 1516       SHIM: Over the last 6 months:   How do you rate your confidence that you could get and keep an erection?  Very Low  Low    When you had erections with sexual stimulation, how often were your erections hard enough for penetration (entering your partner)?  A Few Times (much less than half the time)  Sometimes (about half the time)    During sexual intercourse, how often were you able to maintain your erection after you had penetrated (entered) your partner?  A Few Times (much less than half the time)  A Few  Times (much less than half the time)    During sexual intercourse, how difficult was it to maintain your erection to completion of intercourse?  Difficult  Very Difficult    When you attempted sexual intercourse, how often was it satisfactory for you?  A Few Times (much less than half the time)  A Few Times (much less than half the time)      SHIM Total Score   SHIM  10  11       Score: 1-7 Severe ED 8-11 Moderate ED 12-16 Mild-Moderate ED 17-21 Mild ED 22-25 No ED  Testosterone deficiency Patient is experiencing a decrease in libido, a lack of energy, a decrease in strength, a decreased enjoyment in life, sadness and/or grumpiness, erections being less strong and a recent deterioration in an ability to play sports.  This is indicated by his responses to the ADAM questionnaire. He is no longer having spontaneous erections at night.   He has not been diagnosed with sleep apnea.   He is reporting obesity, poor memory and Type 2 diabetes mellitus.  His pretreatment testosterone levels were 270 and 283.  He is currently managing his testosterone deficiency with Clomid 50 mg, 1/2 tablet daily, started on 08/05/2017.   His FSH, LH and prolactin are normal.     Androgen Deficiency in the Aging Male    Row Name 08/19/17 1500  Androgen Deficiency in the Aging Male   Do you have a decrease in libido (sex drive)  Yes     Do you have lack of energy  Yes     Do you have a decrease in strength and/or endurance  Yes     Have you lost height  No     Have you noticed a decreased "enjoyment of life"  Yes     Are you sad and/or grumpy  Yes     Are your erections less strong  Yes     Have you noticed a recent deterioration in your ability to play sports  Yes     Are you falling asleep after dinner  No     Has there been a recent deterioration in your work performance  No         PMH: Past Medical History:  Diagnosis Date  . Anxiety   . GERD (gastroesophageal reflux disease)   .  Hyperlipidemia   . Hypertension   . Insomnia   . Kidney stones   . Obesity (BMI 30-39.9)   . Seasonal allergies     Surgical History: Past Surgical History:  Procedure Laterality Date  . NO PAST SURGERIES      Home Medications:  Allergies as of 08/19/2017      Reactions   Maple Flavor Rash   Patient also states that he gets a sore throat, numb tongue.      Medication List        Accurate as of 08/19/17  3:30 PM. Always use your most recent med list.          ALPRAZolam 0.5 MG tablet Commonly known as:  XANAX TAKE 1 TABLET BY MOUTH EVERY DAY AS NEEDED FOR ANXIETY   amLODipine 2.5 MG tablet Commonly known as:  NORVASC Take 1 tablet (2.5 mg total) by mouth daily.   clomiPHENE 50 MG tablet Commonly known as:  CLOMID 1/2 tab daily   eszopiclone 2 MG Tabs tablet Commonly known as:  LUNESTA TAKE ONE TABLET BY MOUTH IMMEDIATELY BEFORE BEDTIME.   ibuprofen 800 MG tablet Commonly known as:  ADVIL,MOTRIN Take 1 tablet (800 mg total) by mouth every 8 (eight) hours as needed.   losartan 100 MG tablet Commonly known as:  COZAAR TAKE 1 TABLET (100 MG TOTAL) BY MOUTH DAILY.   omeprazole 40 MG capsule Commonly known as:  PRILOSEC TAKE ONE CAPSULE BY MOUTH ONE TIME DAILY   PARoxetine 10 MG tablet Commonly known as:  PAXIL TAKE ONE TABLET BY MOUTH ONE TIME DAILY WITH FOOD . INCREASE TO TWO TABLETS AFTER ONE MONTH.   sildenafil 20 MG tablet Commonly known as:  REVATIO Take 3 to 5 tablets two hours before intercouse on an empty stomach.  Do not take with nitrates.       Allergies:  Allergies  Allergen Reactions  . Maple Flavor Rash    Patient also states that he gets a sore throat, numb tongue.    Family History: Family History  Problem Relation Age of Onset  . Hypertension Mother   . Cancer Mother        ovarian  . Cancer Father        kidney  . Prostate cancer Neg Hx   . Bladder Cancer Neg Hx     Social History:  reports that  has never smoked. he  has never used smokeless tobacco. He reports that he does not drink alcohol or use drugs.  ROS: UROLOGY  Frequent Urination?: No Hard to postpone urination?: No Burning/pain with urination?: No Get up at night to urinate?: No Leakage of urine?: No Urine stream starts and stops?: No Trouble starting stream?: No Do you have to strain to urinate?: No Blood in urine?: No Urinary tract infection?: No Sexually transmitted disease?: No Injury to kidneys or bladder?: No Painful intercourse?: No Weak stream?: No Erection problems?: Yes Penile pain?: No  Gastrointestinal Nausea?: No Vomiting?: No Indigestion/heartburn?: No Diarrhea?: No Constipation?: No  Constitutional Fever: No Night sweats?: No Weight loss?: No Fatigue?: Yes  Skin Skin rash/lesions?: No Itching?: No  Eyes Blurred vision?: No Double vision?: No  Ears/Nose/Throat Sore throat?: No Sinus problems?: No  Hematologic/Lymphatic Swollen glands?: No Easy bruising?: No  Cardiovascular Leg swelling?: No Chest pain?: No  Respiratory Cough?: No Shortness of breath?: No  Endocrine Excessive thirst?: No  Musculoskeletal Back pain?: No Joint pain?: No  Neurological Headaches?: No Dizziness?: No  Psychologic Depression?: Yes Anxiety?: Yes  Physical Exam: BP 128/85   Pulse 97   Ht 5\' 11"  (1.803 m)   Wt 276 lb (125.2 kg)   BMI 38.49 kg/m   Constitutional: Well nourished. Alert and oriented, No acute distress. HEENT: [place] AT, moist mucus membranes. Trachea midline, no masses. Cardiovascular: No clubbing, cyanosis, or edema. Respiratory: Normal respiratory effort, no increased work of breathing. Skin: No rashes, bruises or suspicious lesions. Lymph: No cervical or inguinal adenopathy. Neurologic: Grossly intact, no focal deficits, moving all 4 extremities. Psychiatric: Normal mood and affect.  Laboratory Data: Lab Results  Component Value Date   WBC 7.2 11/26/2016   HGB 13.4 11/26/2016     HCT 38.2 11/26/2016   MCV 85 11/26/2016   PLT 198 11/26/2016    Lab Results  Component Value Date   CREATININE 0.88 07/03/2017     Lab Results  Component Value Date   HGBA1C 6.3 07/03/2017       Component Value Date/Time   CHOL 205 (H) 07/03/2017 0954   HDL 32.80 (L) 07/03/2017 0954   CHOLHDL 6 07/03/2017 0954   VLDL 40.0 07/03/2017 0954   LDLCALC 132 (H) 07/03/2017 0954    Lab Results  Component Value Date   AST 20 07/03/2017   Lab Results  Component Value Date   ALT 24 07/03/2017   I have reviewed the labs.  Pertinent Imaging: REASON FOR EXAM:    hx kidney stones flank pain hematuria  COMMENTS:   PROCEDURE:     KCT - KCT ABDOMEN/PELVIS WO (STONE)  - Jul 24 2011 11:06AM   RESULT:     Indication: A flank pain   Comparison: None   Technique: Multiple axial images from the lung bases to the symphysis  pubis  were obtained without oral and without intravenous contrast.   Findings:   The lung bases are clear. There is no pleural or pericardial effusions.   There is left nephrolithiasis. No obstructive uropathy. No perinephric  stranding is seen. The kidneys are symmetric in size without evidence for  exophytic mass. There is bladder wall thickening which is likely secondary  to underdistention.   The liver is diffusely low in attenuation likely secondary to hepatic  steatosis. The gallbladder is unremarkable. The spleen demonstrates no  focal  abnormality. The adrenal glands and pancreas are normal.   The unopacified stomach, duodenum, small intestine, and large intestine  are  unremarkable, but evaluation is limited by lack of oral contrast.  There  is  no pneumoperitoneum, pneumatosis, or portal venous  gas. There is no  abdominal or pelvic free fluid. There is no lymphadenopathy.   The abdominal aorta is normal in caliber.   The osseous structures are unremarkable.   IMPRESSION:   1. Nonobstructing left nephrolithiasis.     IMPRESSION:  1.Small bilateral kidney stones without evidence of obstructive uropathy.   Result Narrative  INDICATION: Calculus of kidney. COMPARISON:None. TECHNIQUE: Routine CT of the abdomen and pelvis was performed without intravenous or oral contrast per renal calculus evaluation protocol. This exam is intended to evaluate for presence of renal calculi and does not adequately assess for renal or  urothelial malignancies. Radiation dose reduction was utilized (automated exposure control, mA or kV adjustment based on patient size, or iterative image reconstruction).  FINDINGS:  VISUALIZED LOWER THORAX: Lung bases are clear, heart size is normal.  UROLOGIC: Kidneys/ureters: Small stones in the 2-4 mm range are present in both kidneys. Neither kidney is hydronephrotic, both ureters are normal. Urinary bladder:Normal urinary bladder.  SOLID VISCERA: Liver: Liver is fatty replaced with focal fatty sparing around the gallbladder fossa. Gallbladder: Normal Adrenal glands: Normal Pancreas: Normal Spleen: Normal  GI: No bowel obstruction. Normal appendix.   PERITONEAL CAVITY/RETROPERITONEUM: No free fluid. No pneumoperitoneum. No lymphadenopathy.  PELVIS: Normal prostate and seminal vesicles.  MUSCULOSKELETAL: No acute or destructive osseous processes.  Status     Assessment & Plan:    1. Erectile dysfunction  - SHIM score is 11, it is improved  - He would like to try sildenafil - Sildenafil 20 mg, 3 to 5 tablets two hours prior to intercourse on an empty stomach, # 50; he is warned not to take medications that contain nitrates.  I also advised him of the side effects, such as: headache, flushing, dyspepsia, abnormal vision, nasal congestion, back pain, myalgia, nausea, dizziness, and rash. - he has not tried the sildenafil as of this visit  - RTC in one month for repeat SHIM score  2. History kidney stones  - patient with bilateral nephrolithiasis  - recommend  yearly KUB's  3. Testosterone deficiency   -continue Clomid 50 mg, 1/2 tablet daily  -RTC in one month for morning testosterone - advised the patient that I would not see the results of this lab until I return to the office after the first of the year     Return for keep appointment on the 27th for testosterone drawn.  These notes generated with voice recognition software. I apologize for typographical errors.  Michiel Cowboy, PA-C  [facility] [address] [place], [place] [address] [phone]

## 2017-08-19 ENCOUNTER — Ambulatory Visit: Payer: BLUE CROSS/BLUE SHIELD | Admitting: Urology

## 2017-08-19 ENCOUNTER — Encounter: Payer: Self-pay | Admitting: Urology

## 2017-08-19 VITALS — BP 128/85 | HR 97 | Ht 71.0 in | Wt 276.0 lb

## 2017-08-19 DIAGNOSIS — N529 Male erectile dysfunction, unspecified: Secondary | ICD-10-CM | POA: Diagnosis not present

## 2017-08-19 DIAGNOSIS — E349 Endocrine disorder, unspecified: Secondary | ICD-10-CM | POA: Diagnosis not present

## 2017-08-19 DIAGNOSIS — Z87442 Personal history of urinary calculi: Secondary | ICD-10-CM

## 2017-09-03 ENCOUNTER — Other Ambulatory Visit: Payer: Self-pay

## 2017-09-03 ENCOUNTER — Other Ambulatory Visit: Payer: BLUE CROSS/BLUE SHIELD

## 2017-09-03 DIAGNOSIS — E349 Endocrine disorder, unspecified: Secondary | ICD-10-CM

## 2017-09-03 NOTE — Telephone Encounter (Signed)
Mark Welch called back to check on the this.  She gave Ref number : [id number]     And her phone number to call back [phone]

## 2017-09-03 NOTE — Telephone Encounter (Signed)
Cyndee BrightlyJEHOVANY Ribera (Key: [id number]) spoke with cover my meds  thye will fax over hard copy of renewal PA.

## 2017-09-04 ENCOUNTER — Other Ambulatory Visit: Payer: BLUE CROSS/BLUE SHIELD

## 2017-09-04 LAB — TESTOSTERONE: Testosterone: 611 ng/dL (ref 264–916)

## 2017-09-05 ENCOUNTER — Telehealth: Payer: Self-pay | Admitting: Internal Medicine

## 2017-09-05 NOTE — Telephone Encounter (Signed)
Copied from CRM [phone]#[id number]. Topic: General - Other >> Sep 05, 2017  2:42 PM Gerrianne ScalePayne, Mellina Benison L wrote: Reason for CRM: [employer] [phone] calling to let you know that prior auth. For Bernie CoveySaxenda has been approved and patient can pick RX up from practice

## 2017-09-05 NOTE — Telephone Encounter (Signed)
LMTCB. Need to let pt know that the saxenda has been approved through [employer] insurance.

## 2017-09-05 NOTE — Telephone Encounter (Signed)
PA has been submitted to covermymeds.  

## 2017-09-10 ENCOUNTER — Telehealth: Payer: Self-pay

## 2017-09-10 DIAGNOSIS — E291 Testicular hypofunction: Secondary | ICD-10-CM

## 2017-09-10 NOTE — Telephone Encounter (Signed)
Patient notified, please schedule follow up and apt for labs a few days prior Orders placed thanks

## 2017-09-10 NOTE — Telephone Encounter (Signed)
-----   Message from Harle BattiestShannon A McGowan, PA-C sent at 09/09/2017  9:27 PM EST ----- Please let Charolotte CapuchinJehovany know that his testosterone is within therapeutic levels.  I will need to see him in 6 months with a morning testosterone level drawn before 10 am, LFT's, HCT, HBG and PSA.

## 2017-09-11 ENCOUNTER — Telehealth: Payer: Self-pay | Admitting: Internal Medicine

## 2017-09-11 NOTE — Telephone Encounter (Signed)
Pt has been notified.

## 2017-09-11 NOTE — Telephone Encounter (Signed)
Please advise 

## 2017-09-11 NOTE — Telephone Encounter (Signed)
Copied from CRM [id number]. Topic: Quick Communication - See Telephone Encounter >> Sep 11, 2017 10:01 AM Rudi CocoLathan, Hadja Harral M, NT wrote: CRM for notification. See Telephone encounter for:   09/11/17. Pt. Returning [place] call let pt. Know that med. Was approved by his insurance. Pt. Can be reached at [phone]

## 2017-09-11 NOTE — Telephone Encounter (Signed)
Pt is aware that medication has been approved.

## 2017-10-06 ENCOUNTER — Ambulatory Visit: Payer: BLUE CROSS/BLUE SHIELD | Admitting: Internal Medicine

## 2017-10-06 ENCOUNTER — Encounter: Payer: Self-pay | Admitting: Internal Medicine

## 2017-10-06 VITALS — BP 110/66 | HR 78 | Temp 98.1°F | Resp 15 | Ht 71.0 in | Wt 275.0 lb

## 2017-10-06 DIAGNOSIS — E669 Obesity, unspecified: Secondary | ICD-10-CM

## 2017-10-06 DIAGNOSIS — E785 Hyperlipidemia, unspecified: Secondary | ICD-10-CM | POA: Diagnosis not present

## 2017-10-06 DIAGNOSIS — E119 Type 2 diabetes mellitus without complications: Secondary | ICD-10-CM

## 2017-10-06 DIAGNOSIS — K760 Fatty (change of) liver, not elsewhere classified: Secondary | ICD-10-CM

## 2017-10-06 DIAGNOSIS — R7989 Other specified abnormal findings of blood chemistry: Secondary | ICD-10-CM | POA: Diagnosis not present

## 2017-10-06 DIAGNOSIS — E1165 Type 2 diabetes mellitus with hyperglycemia: Secondary | ICD-10-CM

## 2017-10-06 DIAGNOSIS — I10 Essential (primary) hypertension: Secondary | ICD-10-CM | POA: Diagnosis not present

## 2017-10-06 DIAGNOSIS — F418 Other specified anxiety disorders: Secondary | ICD-10-CM

## 2017-10-06 MED ORDER — LOSARTAN POTASSIUM 100 MG PO TABS: 100.0000 mg | ORAL_TABLET | Freq: Every day | ORAL | 2 refills | Status: DC

## 2017-10-06 MED ORDER — ESZOPICLONE 2 MG PO TABS: ORAL_TABLET | ORAL | 1 refills | Status: DC

## 2017-10-06 MED ORDER — OMEPRAZOLE 40 MG PO CPDR: 40.0000 mg | DELAYED_RELEASE_CAPSULE | Freq: Every day | ORAL | 1 refills | Status: DC

## 2017-10-06 MED ORDER — ALPRAZOLAM 0.5 MG PO TABS: ORAL_TABLET | ORAL | 1 refills | Status: DC

## 2017-10-06 MED ORDER — BUPROPION HCL ER (SR) 100 MG PO TB12: 100.0000 mg | ORAL_TABLET | Freq: Two times a day (BID) | ORAL | 0 refills | Status: DC

## 2017-10-06 NOTE — Patient Instructions (Addendum)
I recommend starting wellbutrin for your depression.  This is different than Paxil. start with one tablet in the morning upon waking and take the second dose after lunch (ok to wait a little while but definitely  before 5 pm)    Your diet has too many starches in it for effective weight loss .  Here are a few alternative choices that will make good substitutions :   For breakfast :  Mark Welch now makes a frozen breakfast frittata and an egg sandwich  that can be microwaved in 2 minutes and are very low carb. Frittatas are similar to quiches without the crust  For lunch:  Avoid pretzels and chips with your sandwhich .   Try the mini sweet peppers and pickles for a crunch snack   For dinner:  Cut back on the rice.  Add more green leafy vegetables.   Try mashed and  riced cauliflower instead , very low carb   You can start the Saxenda once you are comfortable taking the wellbutrin

## 2017-10-06 NOTE — Progress Notes (Signed)
Subjective:  Patient ID: Mark Welch, male    DOB: 04-30-1976  Age: 42 y.o. MRN: [id number]  CC: The primary encounter diagnosis was Fatty liver disease, nonalcoholic. Diagnoses of Uncontrolled type 2 diabetes mellitus with hyperglycemia (HCC), Dyslipidemia (high LDL; low HDL), Essential hypertension, benign, Diet-controlled type 2 diabetes mellitus (HCC), Obesity (BMI 30-39.9), Essential hypertension, Anxiety associated with depression, and Low testosterone in male were also pertinent to this visit.  HPI Mark Welch presents for FOLLOW UP on multiple issues   GAD: Stopped taking  paxil  Because he felt it wasn't helping his depression/anxiety  despite the increase to 20 mg daily. Reports that he has had a persistent "bad mood" since stopping it  2-3 weeks ago,  and has started using alprazolam more often , but not daily .  Triggered by loss of job 2 weeks ago. Marland Kitchen  Hehas resumed going to the gym for the last 3 weeks, sometimes going twice daily . Has multiple orthopedic issues limiting his use of equipment for cardio:   Treadmill aggravates plantar fasciitis, e lliptiglider makes his hamstrings and knees hurt.  [facility] PT [facility] at [facility]  in August 2018 noted hamstring shortening, IT band stiffness  and right knee OA  .  Weight is unchanged.  Has not started Saxenda yet   Type 2 DM: diet controlled .  Diet  reviewed.  Having 1-2 starches per meal (bread,  Chips,  Rice most common)    Lab Results  Component Value Date   HGBA1C 6.3 07/03/2017   Has not been checking sugars .  . fasting today   Was taking clomid for low T (Urology) for constant fatigue , but stopped it due to daily headaches that resolved with suspension    Outpatient Medications Prior to Visit  Medication Sig Dispense Refill  . amLODipine (NORVASC) 2.5 MG tablet Take 1 tablet (2.5 mg total) by mouth daily. 90 tablet 2  . ibuprofen (ADVIL,MOTRIN) 800 MG tablet Take 1 tablet (800 mg total) by  mouth every 8 (eight) hours as needed. 90 tablet 1  . sildenafil (REVATIO) 20 MG tablet Take 3 to 5 tablets two hours before intercouse on an empty stomach.  Do not take with nitrates. 50 tablet 3  . ALPRAZolam (XANAX) 0.5 MG tablet TAKE 1 TABLET BY MOUTH EVERY DAY AS NEEDED FOR ANXIETY 30 tablet 4  . eszopiclone (LUNESTA) 2 MG TABS tablet TAKE ONE TABLET BY MOUTH IMMEDIATELY BEFORE BEDTIME. 90 tablet 1  . losartan (COZAAR) 100 MG tablet TAKE 1 TABLET (100 MG TOTAL) BY MOUTH DAILY. 90 tablet 2  . omeprazole (PRILOSEC) 40 MG capsule TAKE ONE CAPSULE BY MOUTH ONE TIME DAILY 90 capsule 1  . clomiPHENE (CLOMID) 50 MG tablet 1/2 tab daily (Patient not taking: Reported on 10/06/2017) 30 tablet 3  . PARoxetine (PAXIL) 10 MG tablet TAKE ONE TABLET BY MOUTH ONE TIME DAILY WITH FOOD . INCREASE TO TWO TABLETS AFTER ONE MONTH. (Patient not taking: Reported on 10/06/2017) 90 tablet 0   No facility-administered medications prior to visit.     Review of Systems;  Patient denies headache, fevers, malaise, unintentional weight loss, skin rash, eye pain, sinus congestion and sinus pain, sore throat, dysphagia,  hemoptysis , cough, dyspnea, wheezing, chest pain, palpitations, orthopnea, edema, abdominal pain, nausea, melena, diarrhea, constipation, flank pain, dysuria, hematuria, urinary  Frequency, nocturia, numbness, tingling, seizures,  Focal weakness, Loss of consciousness,  Tremor, insomnia, depression, anxiety, and suicidal ideation.  Objective:  BP 110/66 (BP Location: Left Arm, Patient Position: Sitting, Cuff Size: Large)   Pulse 78   Temp 98.1 F (36.7 C) (Oral)   Resp 15   Ht 5\' 11"  (1.803 m)   Wt 275 lb (124.7 kg)   SpO2 96%   BMI 38.35 kg/m   BP Readings from Last 3 Encounters:  10/06/17 110/66  08/19/17 128/85  07/22/17 (!) 141/83    Wt Readings from Last 3 Encounters:  10/06/17 275 lb (124.7 kg)  08/19/17 276 lb (125.2 kg)  07/22/17 273 lb 12.8 oz (124.2 kg)    General  appearance: alert, cooperative and appears stated age Ears: normal TM's and external ear canals both ears Throat: lips, mucosa, and tongue normal; teeth and gums normal Neck: no adenopathy, no carotid bruit, supple, symmetrical, trachea midline and thyroid not enlarged, symmetric, no tenderness/mass/nodules Back: symmetric, no curvature. ROM normal. No CVA tenderness. Lungs: clear to auscultation bilaterally Heart: regular rate and rhythm, S1, S2 normal, no murmur, click, rub or gallop Abdomen: soft, non-tender; bowel sounds normal; no masses,  no organomegaly Pulses: 2+ and symmetric Skin: Skin color, texture, turgor normal. No rashes or lesions Lymph nodes: Cervical, supraclavicular, and axillary nodes normal.  Lab Results  Component Value Date   HGBA1C 6.3 07/03/2017   HGBA1C 8.2 (H) 04/02/2017   HGBA1C 6.8 (H) 10/02/2016    Lab Results  Component Value Date   CREATININE 0.88 07/03/2017   CREATININE 0.83 04/02/2017   CREATININE 0.92 12/30/2016    Lab Results  Component Value Date   WBC 7.2 11/26/2016   HGB 13.4 11/26/2016   HCT 38.2 11/26/2016   PLT 198 11/26/2016   GLUCOSE 114 (H) 07/03/2017   CHOL 205 (H) 07/03/2017   TRIG 200.0 (H) 07/03/2017   HDL 32.80 (L) 07/03/2017   LDLDIRECT 144.0 04/02/2017   LDLCALC 132 (H) 07/03/2017   ALT 24 07/03/2017   AST 20 07/03/2017   NA 138 07/03/2017   K 3.9 07/03/2017   CL 103 07/03/2017   CREATININE 0.88 07/03/2017   BUN 17 07/03/2017   CO2 29 07/03/2017   TSH 1.490 07/22/2017   HGBA1C 6.3 07/03/2017   MICROALBUR 1.4 10/02/2016    No results found.  Assessment & Plan:   Problem List Items Addressed This Visit    Fatty liver disease, nonalcoholic - Primary   Relevant Orders   Comprehensive metabolic panel   Dyslipidemia (high LDL; low HDL)   Relevant Orders   Lipid Profile   Anxiety associated with depression    Discussed adding wellbutrin for depressive symptoms including negative attitude and fatigue,  May need  to add SSRI .  Follow up one month       Relevant Medications   buPROPion (WELLBUTRIN SR) 100 MG 12 hr tablet   ALPRAZolam (XANAX) 0.5 MG tablet   Diet-controlled type 2 diabetes mellitus (HCC)    Recommended starting Saxenda for management of obesity and diet controlled DM  Diet reviewed in detail.  elimination of excessive starches advised and increased use of cardio equipment to achieve weight loss.   Lab Results  Component Value Date   HGBA1C 6.3 07/03/2017   Lab Results  Component Value Date   MICROALBUR 1.4 10/02/2016          Relevant Medications   losartan (COZAAR) 100 MG tablet   Hypertension    Well controlled on current regimen. Renal function stable, no changes today.      Relevant Medications   losartan (COZAAR)  100 MG tablet   Low testosterone in male    Diagnosed by Urology and treated with Clomid.  Patient has d/d'd medication due to persistent headaches       Obesity (BMI 30-39.9)    I have addressed  BMI and recommended a low glycemic index diet utilizing smaller more frequent meals to increase metabolism.  I have also recommended that patient start exercising with a goal of 30 minutes of aerobic exercise a minimum of 5 days per week . patient to start Saxenda.        Other Visit Diagnoses    Essential hypertension, benign       Relevant Medications   losartan (COZAAR) 100 MG tablet   Other Relevant Orders   Comprehensive metabolic panel      I have changed Md A. Corvino's omeprazole. I am also having him start on buPROPion. Additionally, I am having him maintain his ibuprofen, amLODipine, PARoxetine, sildenafil, clomiPHENE, ALPRAZolam, eszopiclone, and losartan.  Meds ordered this encounter  Medications  . buPROPion (WELLBUTRIN SR) 100 MG 12 hr tablet    Sig: Take 1 tablet (100 mg total) by mouth 2 (two) times daily.    Dispense:  180 tablet    Refill:  0  . ALPRAZolam (XANAX) 0.5 MG tablet    Sig: TAKE 1 TABLET BY MOUTH EVERY DAY AS  NEEDED FOR ANXIETY    Dispense:  30 tablet    Refill:  1  . eszopiclone (LUNESTA) 2 MG TABS tablet    Sig: TAKE ONE TABLET BY MOUTH IMMEDIATELY BEFORE BEDTIME.    Dispense:  90 tablet    Refill:  1  . losartan (COZAAR) 100 MG tablet    Sig: Take 1 tablet (100 mg total) by mouth daily.    Dispense:  90 tablet    Refill:  2  . omeprazole (PRILOSEC) 40 MG capsule    Sig: Take 1 capsule (40 mg total) by mouth daily.    Dispense:  90 capsule    Refill:  1   A total of 25 minutes of face to face time was spent with patient more than half of which was spent in counselling about the above mentioned conditions  and coordination of care  Medications Discontinued During This Encounter  Medication Reason  . ALPRAZolam (XANAX) 0.5 MG tablet Reorder  . eszopiclone (LUNESTA) 2 MG TABS tablet Reorder  . losartan (COZAAR) 100 MG tablet Reorder  . omeprazole (PRILOSEC) 40 MG capsule Reorder    Follow-up: Return in about 3 months (around 01/04/2018) for follow up diabetes.   Sherlene Shams, MD

## 2017-10-07 ENCOUNTER — Other Ambulatory Visit (INDEPENDENT_AMBULATORY_CARE_PROVIDER_SITE_OTHER): Payer: BLUE CROSS/BLUE SHIELD

## 2017-10-07 DIAGNOSIS — R7989 Other specified abnormal findings of blood chemistry: Secondary | ICD-10-CM | POA: Insufficient documentation

## 2017-10-07 DIAGNOSIS — I10 Essential (primary) hypertension: Secondary | ICD-10-CM | POA: Diagnosis not present

## 2017-10-07 DIAGNOSIS — K760 Fatty (change of) liver, not elsewhere classified: Secondary | ICD-10-CM | POA: Diagnosis not present

## 2017-10-07 DIAGNOSIS — E1165 Type 2 diabetes mellitus with hyperglycemia: Secondary | ICD-10-CM | POA: Diagnosis not present

## 2017-10-07 DIAGNOSIS — E785 Hyperlipidemia, unspecified: Secondary | ICD-10-CM

## 2017-10-07 LAB — LIPID PANEL
CHOL/HDL RATIO: 7
Cholesterol: 200 mg/dL (ref 0–200)
HDL: 29.4 mg/dL — ABNORMAL LOW (ref >39.00)
LDL Cholesterol: 132 mg/dL — ABNORMAL HIGH (ref 0–99)
NonHDL: 171
TRIGLYCERIDES: 195 mg/dL — AB (ref 0.0–149.0)
VLDL: 39 mg/dL (ref 0.0–40.0)

## 2017-10-07 LAB — COMPREHENSIVE METABOLIC PANEL
ALBUMIN: 3.9 g/dL (ref 3.5–5.2)
ALK PHOS: 36 U/L — AB (ref 39–117)
ALT: 31 U/L (ref 0–53)
AST: 27 U/L (ref 0–37)
BUN: 14 mg/dL (ref 6–23)
CALCIUM: 9 mg/dL (ref 8.4–10.5)
CO2: 28 mEq/L (ref 19–32)
CREATININE: 0.97 mg/dL (ref 0.40–1.50)
Chloride: 102 mEq/L (ref 96–112)
GFR: 89.45 mL/min (ref >60.00)
Glucose, Bld: 130 mg/dL — ABNORMAL HIGH (ref 70–99)
Potassium: 3.7 mEq/L (ref 3.5–5.1)
Sodium: 137 mEq/L (ref 135–145)
Total Bilirubin: 0.6 mg/dL (ref 0.2–1.2)
Total Protein: 7.3 g/dL (ref 6.0–8.3)

## 2017-10-07 LAB — HEMOGLOBIN A1C: Hgb A1c MFr Bld: 6.6 % — ABNORMAL HIGH (ref 4.6–6.5)

## 2017-10-07 NOTE — Assessment & Plan Note (Signed)
Well controlled on current regimen. Renal function stable, no changes today. 

## 2017-10-07 NOTE — Assessment & Plan Note (Signed)
Discussed adding wellbutrin for depressive symptoms including negative attitude and fatigue,  May need to add SSRI .  Follow up one month

## 2017-10-07 NOTE — Assessment & Plan Note (Signed)
Diagnosed by Urology and treated with Clomid.  Patient has d/d'd medication due to persistent headaches

## 2017-10-07 NOTE — Assessment & Plan Note (Signed)
Recommended starting Saxenda for management of obesity and diet controlled DM  Diet reviewed in detail.  elimination of excessive starches advised and increased use of cardio equipment to achieve weight loss.   Lab Results  Component Value Date   HGBA1C 6.3 07/03/2017   Lab Results  Component Value Date   MICROALBUR 1.4 10/02/2016

## 2017-10-07 NOTE — Assessment & Plan Note (Signed)
I have addressed  BMI and recommended a low glycemic index diet utilizing smaller more frequent meals to increase metabolism.  I have also recommended that patient start exercising with a goal of 30 minutes of aerobic exercise a minimum of 5 days per week . patient to start Saxenda.

## 2017-10-09 ENCOUNTER — Encounter: Payer: Self-pay | Admitting: Internal Medicine

## 2017-10-09 NOTE — Progress Notes (Signed)
Based on current lipid profile, the risk of clinically significant CAD is 15% over the next 10 years, using the Framingham risk calculator. The [employer] recommends starting patients aged 44 or higher on moderate intensity statin therapy for LDL between 70-189 and 10 yr risk of CAD > 7.5% ;  and high intensity therapy for anyone with LDL > 190.

## 2017-10-09 NOTE — Assessment & Plan Note (Signed)
Based on current lipid profile, the risk of clinically significant CAD is 15% over the next 10 years, using the Framingham risk calculator. The [employer] recommends starting patients aged 44 or higher on moderate intensity statin therapy for LDL between 70-189 and 10 yr risk of CAD > 7.5% ;  and high intensity therapy for anyone with LDL > 190. Simvastatin trial advised.   Lab Results  Component Value Date   CHOL 200 10/07/2017   HDL 29.40 (L) 10/07/2017   LDLCALC 132 (H) 10/07/2017   LDLDIRECT 144.0 04/02/2017   TRIG 195.0 (H) 10/07/2017   CHOLHDL 7 10/07/2017

## 2017-10-09 NOTE — Assessment & Plan Note (Signed)
Based on your fasting cholesterol ,  your 10 year risk of having some type of coronary event (including heart attack) is 15% , meaning that one of  every 8 women with the same medical history  will have a heart attack or stroke in the next 10 years.     The most recent guidelines from the [employer] of [employer]  I recommend starting moderate intensity statin therapy to lower the risk of heart attack and stroke.

## 2017-10-18 ENCOUNTER — Other Ambulatory Visit: Payer: Self-pay | Admitting: Internal Medicine

## 2017-10-18 DIAGNOSIS — E669 Obesity, unspecified: Secondary | ICD-10-CM

## 2018-01-05 ENCOUNTER — Ambulatory Visit: Payer: BLUE CROSS/BLUE SHIELD | Admitting: Internal Medicine

## 2018-01-05 ENCOUNTER — Encounter: Payer: Self-pay | Admitting: Internal Medicine

## 2018-01-05 VITALS — BP 122/86 | HR 77 | Temp 98.1°F | Resp 15 | Ht 71.0 in | Wt 256.0 lb

## 2018-01-05 DIAGNOSIS — I10 Essential (primary) hypertension: Secondary | ICD-10-CM | POA: Diagnosis not present

## 2018-01-05 DIAGNOSIS — E119 Type 2 diabetes mellitus without complications: Secondary | ICD-10-CM | POA: Diagnosis not present

## 2018-01-05 DIAGNOSIS — R7989 Other specified abnormal findings of blood chemistry: Secondary | ICD-10-CM | POA: Diagnosis not present

## 2018-01-05 DIAGNOSIS — E669 Obesity, unspecified: Secondary | ICD-10-CM | POA: Diagnosis not present

## 2018-01-05 MED ORDER — LOSARTAN POTASSIUM 100 MG PO TABS: 100.0000 mg | ORAL_TABLET | Freq: Every day | ORAL | 1 refills | Status: AC

## 2018-01-05 MED ORDER — OMEPRAZOLE 40 MG PO CPDR: 40.0000 mg | DELAYED_RELEASE_CAPSULE | Freq: Every day | ORAL | 2 refills | Status: AC

## 2018-01-05 MED ORDER — CLOMIPHENE CITRATE 50 MG PO TABS: ORAL_TABLET | ORAL | 1 refills | Status: AC

## 2018-01-05 MED ORDER — CLOMIPHENE CITRATE 50 MG PO TABS
ORAL_TABLET | ORAL | 5 refills | Status: AC
Start: 2018-01-05 — End: ?

## 2018-01-05 MED ORDER — SILDENAFIL CITRATE 20 MG PO TABS: ORAL_TABLET | ORAL | 3 refills | Status: AC

## 2018-01-05 NOTE — Patient Instructions (Addendum)
Congratulations!!   You have done exceedingly well in just 3 months with weight loss!  Please return for fasting  labs,  (4 hours of fasting needed )  your blood pressure is still  above the currently recommended acceptable standards of 120/70, so I am recommending that you check your home readings a few ties before you move and let me know if the amlodipine needs to be resumed

## 2018-01-05 NOTE — Progress Notes (Signed)
Subjective:  Patient ID: Mark Welch, male    DOB: 03/11/1976  Age: 42 y.o. MRN: [id number]  CC: The primary encounter diagnosis was Essential hypertension. Diagnoses of Diet-controlled type 2 diabetes mellitus (HCC), Essential hypertension, benign, Obesity (BMI 30-39.9), and Low testosterone in male were also pertinent to this visit.  HPI Mark Welch presents for  Follow up on TYPE 2 dm,  HYPERTENSION andobesity.  Has lost 21 lbs thorugh diet and exercise.  On his own he has stopped taking Saxenda,  Buproprion, paroxetine and Lunesta.   Still losing weight, using  Portion size reduction and daily reduciton  And exercises 6-7 times per week   Moving to [place] with new wife AND STEPCHILDREN IN [place].   Outpatient Medications Prior to Visit  Medication Sig Dispense Refill  . ibuprofen (ADVIL,MOTRIN) 800 MG tablet Take 1 tablet (800 mg total) by mouth every 8 (eight) hours as needed. 90 tablet 1  . clomiPHENE (CLOMID) 50 MG tablet 1/2 tab daily 30 tablet 3  . losartan (COZAAR) 100 MG tablet Take 1 tablet (100 mg total) by mouth daily. 90 tablet 2  . omeprazole (PRILOSEC) 40 MG capsule Take 1 capsule (40 mg total) by mouth daily. 90 capsule 1  . sildenafil (REVATIO) 20 MG tablet Take 3 to 5 tablets two hours before intercouse on an empty stomach.  Do not take with nitrates. 50 tablet 3  . ALPRAZolam (XANAX) 0.5 MG tablet TAKE 1 TABLET BY MOUTH EVERY DAY AS NEEDED FOR ANXIETY (Patient not taking: Reported on 01/05/2018) 30 tablet 1  . amLODipine (NORVASC) 2.5 MG tablet Take 1 tablet (2.5 mg total) by mouth daily. (Patient not taking: Reported on 01/05/2018) 90 tablet 2  . buPROPion (WELLBUTRIN SR) 100 MG 12 hr tablet Take 1 tablet (100 mg total) by mouth 2 (two) times daily. (Patient not taking: Reported on 01/05/2018) 180 tablet 0  . eszopiclone (LUNESTA) 2 MG TABS tablet TAKE ONE TABLET BY MOUTH IMMEDIATELY BEFORE BEDTIME. (Patient not taking: Reported on 01/05/2018) 90 tablet 1    . PARoxetine (PAXIL) 10 MG tablet TAKE ONE TABLET BY MOUTH ONE TIME DAILY WITH FOOD . INCREASE TO TWO TABLETS AFTER ONE MONTH. (Patient not taking: Reported on 10/06/2017) 90 tablet 0  . SAXENDA 18 MG/3ML SOPN INJECT 0.6MG  INTO THE SKIN ONCE DAILY ON WEEK 1, 1.2MG  DAILY ON WEEK 2, 1.8MG  DAILY ON WEEK 3, THEN 2.4MG  DAILY ON WEEK 4 (Patient not taking: Reported on 01/05/2018) 9 mL 0   No facility-administered medications prior to visit.     Review of Systems;  Patient denies headache, fevers, malaise, unintentional weight loss, skin rash, eye pain, sinus congestion and sinus pain, sore throat, dysphagia,  hemoptysis , cough, dyspnea, wheezing, chest pain, palpitations, orthopnea, edema, abdominal pain, nausea, melena, diarrhea, constipation, flank pain, dysuria, hematuria, urinary  Frequency, nocturia, numbness, tingling, seizures,  Focal weakness, Loss of consciousness,  Tremor, insomnia, depression, anxiety, and suicidal ideation.      Objective:  BP 122/86 (BP Location: Left Arm, Patient Position: Sitting, Cuff Size: Normal)   Pulse 77   Temp 98.1 F (36.7 C) (Oral)   Resp 15   Ht  (1.803 m)   Wt 256 lb (116.1 kg)   SpO2 98%   BMI 35.70 kg/m   BP Readings from Last 3 Encounters:  01/05/18 122/86  10/06/17 110/66  08/19/17 128/85    Wt Readings from Last 3 Encounters:  01/05/18 256 lb (116.1 kg)  10/06/17 275 lb (124.7  kg)  08/19/17 276 lb (125.2 kg)    General appearance: alert, cooperative and appears stated age Ears: normal TM's and external ear canals both ears Throat: lips, mucosa, and tongue normal; teeth and gums normal Neck: no adenopathy, no carotid bruit, supple, symmetrical, trachea midline and thyroid not enlarged, symmetric, no tenderness/mass/nodules Back: symmetric, no curvature. ROM normal. No CVA tenderness. Lungs: clear to auscultation bilaterally Heart: regular rate and rhythm, S1, S2 normal, no murmur, click, rub or gallop Abdomen: soft,  non-tender; bowel sounds normal; no masses,  no organomegaly Pulses: 2+ and symmetric Skin: Skin color, texture, turgor normal. No rashes or lesions Lymph nodes: Cervical, supraclavicular, and axillary nodes normal.  Lab Results  Component Value Date   HGBA1C 6.6 (H) 10/07/2017   HGBA1C 6.3 07/03/2017   HGBA1C 8.2 (H) 04/02/2017    Lab Results  Component Value Date   CREATININE 0.97 10/07/2017   CREATININE 0.88 07/03/2017   CREATININE 0.83 04/02/2017    Lab Results  Component Value Date   WBC 7.2 11/26/2016   HGB 13.4 11/26/2016   HCT 38.2 11/26/2016   PLT 198 11/26/2016   GLUCOSE 130 (H) 10/07/2017   CHOL 200 10/07/2017   TRIG 195.0 (H) 10/07/2017   HDL 29.40 (L) 10/07/2017   LDLDIRECT 144.0 04/02/2017   LDLCALC 132 (H) 10/07/2017   ALT 31 10/07/2017   AST 27 10/07/2017   NA 137 10/07/2017   K 3.7 10/07/2017   CL 102 10/07/2017   CREATININE 0.97 10/07/2017   BUN 14 10/07/2017   CO2 28 10/07/2017   TSH 1.490 07/22/2017   HGBA1C 6.6 (H) 10/07/2017   MICROALBUR 1.4 10/02/2016    No results found.  Assessment & Plan:   Problem List Items Addressed This Visit    Obesity (BMI 30-39.9)    I have congratulated him in reduction of   BMI and encouraged  Continued weight loss with goal of 10% of body weight over the next 6 months using a low glycemic index diet and regular exercise a minimum of 5 days per week.       Low testosterone in male    Diagnosed by Urology and treated with Clomid.  Patient has resumed medication and is tolerating  medication       Relevant Orders   Testos,Total,Free and SHBG (Male)   Hypertension - Primary    Well controlled on current regimen. Renal function  Has been stable, no changes today.      Relevant Medications   losartan (COZAAR) 100 MG tablet   sildenafil (REVATIO) 20 MG tablet   Other Relevant Orders   Comprehensive metabolic panel   Diet-controlled type 2 diabetes mellitus (HCC)    Currently well-controlled on diet  alone  .  hemoglobin A1c is at goal of less than 7.0 . Patient is reminded to schedule an annual eye exam and foot exam is normal today. Patient has no microalbuminuria. Patient is tolerating statin therapy for CAD risk reduction and on ACE/ARB for renal protection and hypertension    Lab Results  Component Value Date   HGBA1C 6.6 (H) 10/07/2017   Lab Results  Component Value Date   MICROALBUR 1.4 10/02/2016          Relevant Medications   losartan (COZAAR) 100 MG tablet   Other Relevant Orders   Hemoglobin A1c   Lipid panel   Microalbumin / creatinine urine ratio    Other Visit Diagnoses    Essential hypertension, benign  Relevant Medications   losartan (COZAAR) 100 MG tablet   sildenafil (REVATIO) 20 MG tablet      I have discontinued Celeste A. Colello's amLODipine, PARoxetine, buPROPion, ALPRAZolam, eszopiclone, and SAXENDA. I am also having him maintain his ibuprofen, losartan, omeprazole, clomiPHENE, clomiPHENE, and sildenafil.  Meds ordered this encounter  Medications  . losartan (COZAAR) 100 MG tablet    Sig: Take 1 tablet (100 mg total) by mouth daily.    Dispense:  90 tablet    Refill:  1  . omeprazole (PRILOSEC) 40 MG capsule    Sig: Take 1 capsule (40 mg total) by mouth daily.    Dispense:  90 capsule    Refill:  2  . clomiPHENE (CLOMID) 50 MG tablet    Sig: 1/2 tab daily    Dispense:  45 tablet    Refill:  1  . clomiPHENE (CLOMID) 50 MG tablet    Sig: 1/2 tab daily    Dispense:  30 tablet    Refill:  5  . sildenafil (REVATIO) 20 MG tablet    Sig: Take 3 to 5 tablets two hours before intercouse on an empty stomach.  Do not take with nitrates.    Dispense:  50 tablet    Refill:  3    Medications Discontinued During This Encounter  Medication Reason  . PARoxetine (PAXIL) 10 MG tablet   . SAXENDA 18 MG/3ML SOPN   . buPROPion (WELLBUTRIN SR) 100 MG 12 hr tablet   . eszopiclone (LUNESTA) 2 MG TABS tablet   . ALPRAZolam (XANAX) 0.5 MG tablet    . amLODipine (NORVASC) 2.5 MG tablet   . losartan (COZAAR) 100 MG tablet Reorder  . omeprazole (PRILOSEC) 40 MG capsule Reorder  . clomiPHENE (CLOMID) 50 MG tablet Reorder  . sildenafil (REVATIO) 20 MG tablet Reorder    Follow-up: No follow-ups on file.   Sherlene Shams, MD

## 2018-01-06 NOTE — Assessment & Plan Note (Addendum)
Currently well-controlled on diet alone  .  hemoglobin A1c is at goal of less than 7.0 . Patient is reminded to schedule an annual eye exam and foot exam is normal today. Patient has no microalbuminuria. Patient is tolerating statin therapy for CAD risk reduction and on ACE/ARB for renal protection and hypertension    Lab Results  Component Value Date   HGBA1C 6.6 (H) 10/07/2017   Lab Results  Component Value Date   MICROALBUR 1.4 10/02/2016

## 2018-01-06 NOTE — Assessment & Plan Note (Signed)
Well controlled on current regimen. Renal function  Has been stable, no changes today.

## 2018-01-06 NOTE — Assessment & Plan Note (Signed)
I have congratulated him in reduction of   BMI and encouraged  Continued weight loss with goal of 10% of body weight over the next 6 months using a low glycemic index diet and regular exercise a minimum of 5 days per week.   

## 2018-01-06 NOTE — Assessment & Plan Note (Signed)
Diagnosed by Urology and treated with Clomid.  Patient has resumed medication and is tolerating  medication

## 2018-01-22 ENCOUNTER — Telehealth: Payer: Self-pay | Admitting: Internal Medicine

## 2018-01-22 DIAGNOSIS — Z0279 Encounter for issue of other medical certificate: Secondary | ICD-10-CM

## 2018-01-22 NOTE — Telephone Encounter (Signed)
fyi

## 2018-01-22 NOTE — Telephone Encounter (Signed)
Forms have been placed in red folder.  

## 2018-01-22 NOTE — Telephone Encounter (Signed)
First Message sent in error.  Papers are up front in Dr. Melina Schools color folder that need to be completed.

## 2018-01-22 NOTE — Telephone Encounter (Signed)
Pt dropped of papers for Dr. Birdie Sons to fill out. Papers are located in color folder up front.

## 2018-01-26 NOTE — Telephone Encounter (Signed)
Signed and returned to you this morning . Needs labs and OV printed out and sent with form

## 2018-01-30 NOTE — Telephone Encounter (Signed)
Spoke with pt to let pt know that paperwork has been completed. Pt requested that we mail these papers in the envelope he provider.

## 2018-03-03 ENCOUNTER — Other Ambulatory Visit: Payer: BLUE CROSS/BLUE SHIELD

## 2018-03-10 ENCOUNTER — Ambulatory Visit: Payer: BLUE CROSS/BLUE SHIELD | Admitting: Urology

## 2018-03-18 ENCOUNTER — Ambulatory Visit (INDEPENDENT_AMBULATORY_CARE_PROVIDER_SITE_OTHER): Payer: Commercial Managed Care - POS | Admitting: Family

## 2018-03-18 ENCOUNTER — Encounter (INDEPENDENT_AMBULATORY_CARE_PROVIDER_SITE_OTHER): Payer: Self-pay

## 2018-03-18 VITALS — BP 128/92 | HR 80 | Temp 98.6°F | Resp 16 | Ht 71.0 in | Wt 222.0 lb

## 2018-03-18 DIAGNOSIS — M546 Pain in thoracic spine: Secondary | ICD-10-CM

## 2018-03-18 MED ORDER — CYCLOBENZAPRINE HCL 10 MG PO TABS
10.00 mg | ORAL_TABLET | Freq: Three times a day (TID) | ORAL | 0 refills | Status: AC | PRN
Start: 2018-03-18 — End: 2018-05-17

## 2018-03-18 MED ORDER — NAPROXEN 500 MG PO TABS
500.00 mg | ORAL_TABLET | Freq: Two times a day (BID) | ORAL | 0 refills | Status: AC | PRN
Start: 2018-03-18 — End: 2018-03-28

## 2018-03-18 NOTE — Patient Instructions (Signed)
Thoracic Spine Strain  The thoracic spine is the middle part of the back between the neck and the lower back. A thoracic spine strain is due to stretching and tearing of the muscle fibers that support the spine. This may happen because of severe coughing or heavy lifting. Or it may be caused by twisting injuries of the upper back, such as from a fall or a car or bike accident.  This often causes increased pain when you move or breathe deeply. This may take 3 to 6 weeks or longer to heal.  Home care   Rest. Avoid heavy lifting or strenuous work. Avoid any activity that causes pain.   You may find relief withheat(hot shower, hot bath or heating pad) andmassage. Or you may prefercold packs. Try both and use the method that feels best for 20 minutes several times a day. To make an ice pack, put ice cubes in a plasticbag that seals at the top. Wrap the bag in aclean, thintowel or cloth. Never put ice or an ice pack directly on your skin.   If you have a severe cough, use an over-the-countercoughmedicineunless another cough medicine was prescribed.   You may useover-the-counter pain medicineto control pain, unless another medicine was prescribed. Talk with your provider before using these medicines if you have chronic liver or kidney disease or ever had a stomach ulcer or gastrointestinal bleeding.  Follow-up care  Follow up with your healthcare provider, or as directed.  When to seek medical advice  Call your healthcare provider right away if you have:   A change in the type of pain: if it feels different, becomes more severe, lasts longer, or begins to spread into your shoulder, arm, neck, jaw or back  Call 911  Call 911 if you have:   Shortness of breath or increased pain with breathing   Cough with dark colored sputum (phlegm) or blood   Weakness, dizziness, or fainting   Numbness or weakness in one or both legs or arms  Date Last Reviewed: 01/07/2017   2000-2019 [employer], [place]. [address] [place], [place] [address]. All rights reserved. This information is not intended as a substitute for professional medical care. Always follow your healthcare professional's instructions.    Flexeril for muscle spasm.  Do not take while driving or while operating heavy machinery.  Naprosyn for pain.  Do not stay in one position for more than 20-30 minutes  Gentle movements, gentle walking,   Gentle stretches as tolerated  Heat to back, 10-15 min, 3-4 times a day  Anti-inflammatories as directed for the next 5-10 days  Follow up with [facility].

## 2018-03-18 NOTE — Progress Notes (Signed)
[facility]  [facility]     Patient: [facility]   Date: 03/18/2018   MRN: [id number]       [name] is a 42 y.o. male      HISTORY     Chief Complaint   Patient presents with   . Back Pain     mid back pain x 2 months; no known injury; has seen chiropractor with no relief         42 YO M with h/o GERD and HTN c/o mid thoracic back pain x 2 months.  Started after moving and lifting heavy boxes.  Denies chest pain, sob, n/v/d, abd pain, numbness, tingling, weakness, changes in bowel/bladder.  Has seen chiropractor for 2 weeks without relief.       Back Pain   This is a new problem. The current episode started more than 1 month ago. The problem occurs constantly. The problem is unchanged. The pain is present in the thoracic spine. The quality of the pain is described as aching. The pain does not radiate. The pain is at a severity of 5/10. The pain is moderate. The symptoms are aggravated by sitting and standing. Associated symptoms include weight loss. Pertinent negatives include no abdominal pain, bladder incontinence, bowel incontinence, chest pain, dysuria, fever, headaches, leg pain, numbness, paresis, paresthesias, perianal numbness, tingling or weakness. (Intentional weight loss over 6 months. ) Risk factors include obesity. He has tried chiropractic manipulation for the symptoms. The treatment provided no relief.       Review of Systems   Constitutional: Positive for weight loss. Negative for activity change, chills, diaphoresis and fever.   HENT: Negative for congestion, rhinorrhea and sore throat.    Respiratory: Negative for shortness of breath.    Cardiovascular: Negative for chest pain.   Gastrointestinal: Negative for abdominal pain, bowel incontinence, constipation, nausea and vomiting.   Genitourinary: Negative for bladder incontinence, difficulty urinating, dysuria and flank pain.   Musculoskeletal: Positive for back pain. Negative for gait problem, joint swelling and neck pain.    Skin: Negative for rash.   Neurological: Negative for dizziness, tingling, weakness, light-headedness, numbness, headaches and paresthesias.   Psychiatric/Behavioral: Negative for confusion and sleep disturbance. The patient is not nervous/anxious.        History:  Past Medical History:   Diagnosis Date   . Gastroesophageal reflux disease    . Hypertension        History reviewed. No pertinent surgical history.    Family History   Problem Relation Age of Onset   . Cancer Mother    . Diabetes Mother    . Cancer Father        Social History   Substance Use Topics   . Smoking status: Never Smoker   . Smokeless tobacco: Never Used   . Alcohol use Not on file       History reviewed.        Current Outpatient Prescriptions:   .  Losartan Potassium (COZAAR PO), Take by mouth, Disp: , Rfl:   .  omeprazole (PRILOSEC) 40 MG capsule, Take 40 mg by mouth, Disp: , Rfl:   .  SILDENAFIL CITRATE PO, Take by mouth, Disp: , Rfl:   .  cyclobenzaprine (FLEXERIL) 10 MG tablet, Take 1 tablet (10 mg total) by mouth every 8 (eight) hours as needed for Muscle spasms, Disp: 30 tablet, Rfl: 0  .  naproxen (NAPROSYN) 500 MG tablet, Take 1 tablet (500 mg total) by mouth  every 12 (twelve) hours as needed (pain), Disp: 30 tablet, Rfl: 0    No Known Allergies    Medications and Allergies reviewed.    PHYSICAL EXAM     Vitals:    03/18/18 2049   BP: (!) 128/92   Pulse: 80   Resp: 16   Temp: 98.6 F (37 C)   SpO2: 97%   Weight: 100.7 kg (222 lb)   Height: 1.803 m (5\' 11" )       Physical Exam   Nursing note and vitals reviewed.  Constitutional: He is oriented to person, place, and time. He appears well-developed and well-nourished. No distress.   HENT:   Head: Normocephalic and atraumatic.   Eyes: Conjunctivae are normal. Pupils are equal, round, and reactive to light. No scleral icterus.   Neck: Normal range of motion. Neck supple. No spinous process tenderness and no muscular tenderness present. Normal range of motion present.   Cardiovascular:  Normal rate, regular rhythm and normal heart sounds.    Pulmonary/Chest: Effort normal and breath sounds normal.   Musculoskeletal: He exhibits tenderness. He exhibits no edema.        Thoracic back: He exhibits tenderness and pain. He exhibits normal range of motion, no bony tenderness, no swelling, no edema, no deformity, no laceration, no spasm and normal pulse.        Lumbar back: He exhibits decreased range of motion, tenderness and pain.        Back:    Neurological: He is alert and oriented to person, place, and time. He has normal reflexes. He displays normal reflexes. He exhibits normal muscle tone. Coordination and gait normal.   SLR - negative bilaterally   Skin: Skin is warm. No rash noted.   Psychiatric: He has a normal mood and affect. His behavior is normal.         UCC COURSE       Results     ** No results found for the last 24 hours. **            No results found.      Orders Placed This Encounter   Medications   . cyclobenzaprine (FLEXERIL) 10 MG tablet     Sig: Take 1 tablet (10 mg total) by mouth every 8 (eight) hours as needed for Muscle spasms     Dispense:  30 tablet     Refill:  0   . naproxen (NAPROSYN) 500 MG tablet     Sig: Take 1 tablet (500 mg total) by mouth every 12 (twelve) hours as needed (pain)     Dispense:  30 tablet     Refill:  0         PROCEDURES     Procedures       ASSESSMENT     Encounter Diagnosis   Name Primary?   . Acute bilateral thoracic back pain Yes          SSESSMENT    PLAN     Flexeril for muscle spasm.  Do not take while driving or while operating heavy machinery.  Naprosyn for pain.  Do not stay in one position for more than 20-30 minutes  Gentle movements, gentle walking,   Gentle stretches as tolerated  Heat to back, 10-15 min, 3-4 times a day  Anti-inflammatories as directed for the next 5-10 days  Follow up with [facility]          Discussed results and diagnosis with patient/family.  Reviewed warning signs for worsening condition, as well as,  indications for follow-up with pmd and return to urgent care clinic.   Patient/family expressed understanding of instructions.    Orders Placed This Encounter   Procedures   . Referral to [facility]         An After Visit Summary was printed and given to the patient.      Signed,  Rometta Emery, FNP

## 2018-04-01 ENCOUNTER — Telehealth: Payer: Commercial Managed Care - POS

## 2018-04-09 ENCOUNTER — Ambulatory Visit (FREE_STANDING_LABORATORY_FACILITY): Payer: Commercial Managed Care - POS | Admitting: Family Medicine

## 2018-04-09 ENCOUNTER — Encounter (INDEPENDENT_AMBULATORY_CARE_PROVIDER_SITE_OTHER): Payer: Self-pay | Admitting: Family Medicine

## 2018-04-09 VITALS — BP 123/74 | HR 69 | Temp 98.2°F | Ht 69.25 in | Wt 222.0 lb

## 2018-04-09 DIAGNOSIS — F419 Anxiety disorder, unspecified: Secondary | ICD-10-CM | POA: Insufficient documentation

## 2018-04-09 DIAGNOSIS — Z0000 Encounter for general adult medical examination without abnormal findings: Secondary | ICD-10-CM

## 2018-04-09 DIAGNOSIS — F418 Other specified anxiety disorders: Secondary | ICD-10-CM

## 2018-04-09 DIAGNOSIS — M549 Dorsalgia, unspecified: Secondary | ICD-10-CM | POA: Insufficient documentation

## 2018-04-09 DIAGNOSIS — K219 Gastro-esophageal reflux disease without esophagitis: Secondary | ICD-10-CM

## 2018-04-09 DIAGNOSIS — I10 Essential (primary) hypertension: Secondary | ICD-10-CM

## 2018-04-09 LAB — MICROALBUMIN, RANDOM URINE
Urine Creatinine, Random: 162.7 mg/dL
Urine Microalbumin, Random: 9 (ref 0.0–30.0)
Urine Microalbumin/Creatinine Ratio: 6 ug/mg (ref 0–30)

## 2018-04-09 LAB — CBC AND DIFFERENTIAL
Absolute NRBC: 0 10*3/uL (ref 0.00–0.00)
Basophils Absolute Automated: 0.05 10*3/uL (ref 0.00–0.08)
Basophils Automated: 0.7 %
Eosinophils Absolute Automated: 0 10*3/uL (ref 0.00–0.44)
Eosinophils Automated: 0 %
Hematocrit: 41.3 % (ref 37.6–49.6)
Hgb: 13.7 g/dL (ref 12.5–17.1)
Immature Granulocytes Absolute: 0.02 10*3/uL (ref 0.00–0.07)
Immature Granulocytes: 0.3 %
Lymphocytes Absolute Automated: 1.84 10*3/uL (ref 0.42–3.22)
Lymphocytes Automated: 26.2 %
MCH: 29.3 pg (ref 25.1–33.5)
MCHC: 33.2 g/dL (ref 31.5–35.8)
MCV: 88.2 fL (ref 78.0–96.0)
MPV: 11.1 fL (ref 8.9–12.5)
Monocytes Absolute Automated: 0.58 10*3/uL (ref 0.21–0.85)
Monocytes: 8.3 %
Neutrophils Absolute: 4.54 10*3/uL (ref 1.10–6.33)
Neutrophils: 64.5 %
Nucleated RBC: 0 /100 WBC (ref 0.0–0.0)
Platelets: 219 10*3/uL (ref 142–346)
RBC: 4.68 10*6/uL (ref 4.20–5.90)
RDW: 13 % (ref 11–15)
WBC: 7.03 10*3/uL (ref 3.10–9.50)

## 2018-04-09 LAB — COMPREHENSIVE METABOLIC PANEL
ALT: 20 U/L (ref 0–55)
AST (SGOT): 19 U/L (ref 5–34)
Albumin/Globulin Ratio: 1.1 (ref 0.9–2.2)
Albumin: 4.2 g/dL (ref 3.5–5.0)
Alkaline Phosphatase: 59 U/L (ref 38–106)
BUN: 14 mg/dL (ref 9.0–28.0)
Bilirubin, Total: 1 mg/dL (ref 0.2–1.2)
CO2: 30 mEq/L — ABNORMAL HIGH (ref 21–29)
Calcium: 10.1 mg/dL (ref 8.5–10.5)
Chloride: 101 mEq/L (ref 100–111)
Creatinine: 0.9 mg/dL (ref 0.5–1.5)
Globulin: 3.9 g/dL — ABNORMAL HIGH (ref 2.0–3.7)
Glucose: 82 mg/dL (ref 70–100)
Potassium: 4 mEq/L (ref 3.5–5.1)
Protein, Total: 8.1 g/dL (ref 6.0–8.3)
Sodium: 139 mEq/L (ref 136–145)

## 2018-04-09 LAB — GFR: EGFR: >60

## 2018-04-09 LAB — HEMOGLOBIN A1C
Average Estimated Glucose: 116.9 mg/dL
Hemoglobin A1C: 5.7 % (ref 4.6–5.9)

## 2018-04-09 LAB — LIPID PANEL
Cholesterol / HDL Ratio: 5.4
Cholesterol: 223 mg/dL — ABNORMAL HIGH (ref 0–199)
HDL: 41 mg/dL (ref 40–9999)
LDL Calculated: 162 mg/dL — ABNORMAL HIGH (ref 0–99)
Triglycerides: 101 mg/dL (ref 34–149)
VLDL Calculated: 20 mg/dL (ref 10–40)

## 2018-04-09 LAB — TSH: TSH: 1.22 u[IU]/mL (ref 0.35–4.94)

## 2018-04-09 LAB — HEMOLYSIS INDEX: Hemolysis Index: 3 (ref 0–18)

## 2018-04-09 NOTE — Progress Notes (Signed)
Date: 04/09/2018 11:16 AM   Patient ID: Derrick Nixon is a 42 y.o. male.         Have you seen any specialists/other providers since your last visit with [place]?  No  Arm preference verified? Yes  The patient is due for nothing at this time, HM is up-to-date.    Subjective:      Chief Complaint:  Chief Complaint   Patient presents with   . Annual Exam     referral to see a spine specialist for back pain.        HPI:  Visit Type: Health Maintenance Visit  Work Status: working full-time, Health visitor  Reported Health: fair health  Reported Diet: compliant with well-balanced diet and working on low carb and high protein diet.   Reported Exercise: regularly, 3-4x/week, 1-2 hours/day, elliptical, and weight lifting. , walking and exercise bike  Dental: regular dental visits twice a year  Vision: glasses and regular eye exams   Hearing: normal hearing  Immunization Status: immunizations up to date  Reproductive Health: not currently sexually active, separated, no children, staying with family  Prior Screening Tests: last PSA < 1 year ago, no previous colorectal cancer screening and no previous dexa scan  General Health Risks: no family history of prostate cancer, no family history of colon cancer and no family history of breast cancer  Safety Elements Used: uses seat belts, smoke detectors in household, carbon monoxide detectors in household, sunscreen use, does not text and drive and no guns at home    BACK PAIN:  Pt states that he's had some chronic issues with his back.  Usually in the lower thoracic and lumbar area.  Pain is worse with prolonged standing and prolonged sitting.  Deneis any numbness or tingling.        Problem List:  Patient Active Problem List   Diagnosis   . Mid back pain   . Situational anxiety       Current Medications:  Current Outpatient Prescriptions   Medication Sig Dispense Refill   . ALPRAZolam (XANAX) 0.5 MG tablet Take 0.5 mg by mouth as needed     . cyclobenzaprine (FLEXERIL) 10 MG tablet  Take 1 tablet (10 mg total) by mouth every 8 (eight) hours as needed for Muscle spasms 30 tablet 0   . Losartan Potassium (COZAAR PO) Take by mouth     . omeprazole (PRILOSEC) 40 MG capsule Take 40 mg by mouth       No current facility-administered medications for this visit.        Allergies:  Allergies   Allergen Reactions   . Maple Flavor      Maple tree, and maple syrup: rash, tongue numbness, and swelling.        Past Medical History:  Past Medical History:   Diagnosis Date   . Gastroesophageal reflux disease    . Hypertension        Past Surgical History:  History reviewed. No pertinent surgical history.    Family History:  Family History   Problem Relation Age of Onset   . Cancer Mother    . Diabetes Mother    . Cancer Father        Social History:  Social History     Social History   . Marital status: Married     Spouse name: N/A   . Number of children: N/A   . Years of education: N/A     Occupational History   .  Not on file.     Social History Main Topics   . Smoking status: Never Smoker   . Smokeless tobacco: Never Used   . Alcohol use No   . Drug use: No   . Sexual activity: Not Currently     Other Topics Concern   . Not on file     Social History Narrative   . No narrative on file       The following sections were reviewed this encounter by the provider:   Tobacco  Meds  Problems  Med Hx  Surg Hx  Fam Hx  Soc Hx        Vitals:  BP 123/74 (BP Site: Right arm, Patient Position: Sitting, Cuff Size: Medium)   Pulse 69   Temp 98.2 F (36.8 C) (Oral)   Ht 1.759 m (5' 9.25")   Wt 100.7 kg (222 lb)   SpO2 98%   BMI 32.55 kg/m        Review of Systems  General ROS: negative for - chills, fatigue, fever, hot flashes, malaise, night sweats, sleep disturbance; positive for - weight loss (50 lbs over the past 7 months with diet and exercise)  Psychological ROS: positive for - situational anxiety; negative for -  concentration difficulties, depression  Ophthalmic ROS: negative for - blurry vision, double  vision, eye pain or photophobia  ENT ROS: negative for - hearing change, nasal congestion, nasal discharge, sinus pain, sore throat, tinnitus or vertigo  Allergy and Immunology ROS: negative for - hives, itchy/watery eyes, nasal congestion, postnasal drip or seasonal allergies  Hematological and Lymphatic ROS: negative for - bleeding problems, bruising, fatigue, night sweats, swollen lymph nodes or weight loss  Endocrine ROS: negative for - hot flashes, polydipsia/polyuria, temperature intolerance or unexpected weight changes  Respiratory ROS: negative for - cough, orthopnea, shortness of breath, sputum changes or wheezing  Cardiovascular ROS: negative for - chest pain, edema, irregular heartbeat, palpitations or shortness of breath  Gastrointestinal ROS: negative for - abdominal pain, appetite loss, blood in stools, constipation, diarrhea, gas/bloating, heartburn or nausea/vomiting  Genito-Urinary ROS: negative for - dysuria, hematuria or urinary frequency/urgency  Musculoskeletal ROS: negative for - muscle pain or muscular weakness; positive for - intermittent back pain, hip pain  Neurological ROS: negative for - dizziness, headaches or numbness/tingling   Dermatological ROS: negative for - dry skin, pruritus and rash      Objective:       Physical Exam  General appearance - alert, well appearing, and in no distress, oriented to person, place, and time and normal appearing weight  Mental status - alert, oriented to person, place, and time, normal mood, behavior, speech, dress, motor activity, and thought processes  Eyes - pupils equal and reactive, extraocular eye movements intact  Ears - bilateral TM's and external ear canals normal  Nose - normal and patent, no erythema, discharge or polyps and normal nontender sinuses  Mouth - mucous membranes moist, pharynx normal without lesions  Neck - supple, no significant adenopathy, no cervical lymphadenopathy  Chest - clear to auscultation, no wheezes, rales or rhonchi,  symmetric air entry  Heart - normal rate, regular rhythm, normal S1, S2, no murmurs, rubs, clicks or gallops  Abdomen - soft, nontender, nondistended, no masses or organomegaly  Neurological - alert, oriented, normal speech, no focal findings or movement disorder noted  Musculoskeletal - no joint tenderness, deformity or swelling  Extremities - peripheral pulses normal, no pedal edema, no clubbing or cyanosis  Skin -  normal coloration and turgor, no rashes, no suspicious skin lesions noted      Assessment/Plan:       1. Well adult exam  - CBC and differential  - Comprehensive metabolic panel  - TSH  - Lipid panel  - Hemoglobin A1C    2. Essential hypertension  - Comprehensive metabolic panel  - Microalbumin, Random Urine  - Pt stable on current medication regimen, no concerns    3. GERD without esophagitis  - Pt stable on current medication regimen, no concerns  -  Advised pt to try to decrease Omeprazole to 20mg  if possible    4. Mid back pain  - Ambulatory referral to Physical Therapy    5. Situational anxiety  - Ambulatory referral to [facility] Almetta Lovely, DO

## 2018-04-10 ENCOUNTER — Ambulatory Visit: Payer: Commercial Managed Care - POS

## 2018-04-14 ENCOUNTER — Ambulatory Visit (INDEPENDENT_AMBULATORY_CARE_PROVIDER_SITE_OTHER): Payer: Commercial Managed Care - POS

## 2018-04-14 DIAGNOSIS — F411 Generalized anxiety disorder: Secondary | ICD-10-CM

## 2018-04-14 DIAGNOSIS — F410 Panic disorder [episodic paroxysmal anxiety] without agoraphobia: Secondary | ICD-10-CM

## 2018-04-14 NOTE — Progress Notes (Signed)
[place] [facility]al [facility] Integrated Intake Assessment     04/14/2018    Clayson Riling, a 42 y.o. male, for initial evaluation visit.    Start: 11:05 AM  End:  11:48 AM    Referred by Dr. Clovis Riley.    Chief Complaint: Rider Ermis presents today for an initial evaluation visit to establish individual psychotherapy.  Patient states that he is seeking therapy services for assistance with symptoms of anxiety.    Current frequency (How often do you feel X?): Most days  Current intensity (On a scale of 1 - 10 how bad has is been over past week?): 7  Current duration (How long do symptoms last?): Moments to all day      Current Symptoms:(see/administer PHQ-9 and GAD-7)    PHQ-9 score:  14 (Moderate)    [x]  Loss of interest or pleasure in activities (inc loss of libido)  [x]  Depressed mood/ feeling hopeless / increased crying spells   [x]  Sleep pattern disturbance (including nightmares)  [x]  Feeling tired/low energy/ fatigue  [x]  Change in appetite   [x]  Excessive guilt / feeling bad about self  [x]  Concentration/forgetfulness  [x]  Psychomotor retardation or agitation  [x]  Suicidal or self-harming ideation       GAD-7 score: 14 (Moderate)     [x]  Anxiety or Nervousness  [x]  Inability to stop/control excessive worry   [x]  Worrying too much about different things  [x]  Difficulty relaxing / restlessness   [x]  Increased irritability  [x]  Feeling afraid as if something awful might happen  []  Excessive energy  []  Hallucinations   [x]  Panic attacks   [] ________________        History of present concerns:  Patient reports the problem started as a child, but most recent episode was a couple months ago  Perceived triggers/precipitating event(s): marriage and separation/divorce, move to [place]  Patient reports the problem has been increasing over time.       Current Daily functioning  How do spend your time during the day?  Work, exercise      Impact of symptoms upon functioning:  Lack of energy/motivation, increase in  procrastination, challenges in social situations leading to avoidance, decrease in self-worth, difficulty making decisions      Current Stressors:      [x]  Work/School related      []  Financial      [x]  Family/Relationships       []  Legal      []  Health       []  Other      Coping Strategies: (What have you tried to help manage the difficulties?)  Exercise, being outside      Past Mental Health History:   Previous Mental HealthTreatment/Diagnosis: no  Past psychotropic medications: Paxil (bad side effects)  Current psychotropic medications: Xanax, Bupropion (seems helpful)  Previous hospitalizations: None reported       Brief Personal History:    Patient currently lives with sister, brother-in-law, and three nieces.  Patient states that he is having difficulty being around people so he avoids being at home often.     Patient has been married for ~2 months and is currently separated and in the process of getting divorced.  Patient states that he rushed into this marriage and that it was a mistake.  Patient denies any other marriages and does not have children.    Patient is originally [place]  Patient immigrated to [place] with his family when he was ~42 years old.  Patient describes this move  as challenging and when he first began to notice anxiety.     Patient's father has never been a part of his life, and therefore patient has very limited knowledge about him. Patient's mother raised him alone until she married when patient was ~42 years old.  Patient states that he has a good relationship with his mother and step-father.  Patient has 2 siblings (younger sister and younger brother).  Patient has a fine relationship with each of his siblings.    Cultural/Ethnic/Spiritual background: Latino, Catholic, moderate level of involvement (patient finds this helpful in his life)    Functioning Relationships: alone & isolated      Family History of Mental Illness:  Patient was told that his biological father attempted  suicide and was hospitalized at one point in his life.      Trauma  Pt does not/ reports history of abuse: none.   Pt has experienced/ witnessed the following traumatic events: None reported  Reexperiencing: N\A      Risk Assessment   Suicidal ideation: no.  Patient states that he has occasional passive ideation, but that it is fleeting.  Suicidal intent: no  Suicidal plan: no  History of attempt (s): no  Past or current self-harming behavior: None reported    Homicidal ideation:    Do you have thoughts of harming someone else? no  Do you have a plan to harm anyone else at this time: no      *Safety Planning (if pt endorsed any SI/HI statements)     [x]  Assessed and explored patient's thoughts suicidal ideation, self-harm.  Not considered an active concern at this time     []  The patient is considered at moderate risk for suicide, but not to the degree that requires immediate inpatient hospitalization. Patient agreed to safety plan which includes calling 911 and/or going to ER if needed.       []  Patient is considered at high risk of harm to self/others.  Patient sent for further evaluation with emergency services.       Substance Abuse History:  Recreational drugs: None reported  Use of alcohol: occasional, social use  Use of caffeine: Energy Drink (1/day)  Tobacco use: no       Mental Status Evaluation:  General/Appearance:  [x] age appropriate     [] older than stated age     [] younger than stated age     [] bearded     [] piercings     [] tattooed         [x] average build     []  thin & gaunt looking     [] overweight     [x] casually dressed     [] disheveled     [] well dressed      [] neat    [x] good eye contact     [] avoidant eye contact     [x] cooperative     [] hesitant     [] defiant     [] Other:    Gait:    [x]  normal gait     []  gait abnormality:    Behavior/Psychomotor:    [] normal     [] psychomotor agitation     [] restless     [x] fidgety     [] tics     [] rigidity     [] flaccid      [] psychomotor retardation      [] akathesia     [] choreaoathetoid movt   [] Other:    Speech:      [x] Normal pitch     [] normal volume    []   articulation error    [] delayed    [] increased latency of response     [] loud    [] pressured    [] profane     [x] soft [] perseveration  [] Other:    Mood:  []  Normal     [] angry    [x] anxious    [] constricted    [] decreased range     [] depressed    [] dysthymic    [] euphoric    [] euthymic    [] irritable    [] labile     [x] sad   [] Other:    Affect:      [x] congruent with mood      [] full      [] constricted      [] guarded      [] flat      [] blunted      [] expansive   [] Other:    Thought Process /Attention /Concentration:      [x] Normal    []  blocked    [] circumstantial     [] concrete    [] flight of ideas     [x] goal directed    [] loose associations     [] tangential       [] distractable      [] inattentive       [x] associations intact [x]  abstract reasoning intact   Thought Content:     [] delusions    [] obsessions        []  absent of suicidal or homocidal ideation   [x] absent of psychotic symptoms  []  A/H     [] paranoia          [] obsessions      [] suicidal ideation      [] suicidal plan      [] suicidal intent      [x] passive suicidal ideation      [] homicidal ideation      [] homicidal plan      [] homicidal intent  [] Other:    Perception /Sensorium:         [x] alert     [] drowsy      [x] oriented to person    [x] oriented to place     [x] oriented to time     [x] oriented to situation          [x] does not appear to be reacting to internal or external stimuli today       [] appears to be reacting to internal or external stimuli  [] Other:    Language:  [x] age appropriate    []  naming okay   []  repetition    Fund of knowledge:  [x] age appropriate    [x]  adequate   [] in adequate []  above average   Cognition/Memory:        [x]  cognition grossly intact       []  cognition impaired due to:   []  immediate recall deficit  []  recent memory deficit  [] delayed memory deficit   [] MMSE:   [] MOCA:    Insight:        [x] age appropriate       [x] fair       [] good       [] limited   Judgment:        [x] age appropriate      [x] fair       [] good       [] limited          Assessment:  Patient is a 42 y.o. year old male originally from [place]  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    Identified precipitating factors: marriage and separation/divorce, move to [place]  Identified maintaining factors: Isolation  Identified protective factors and current strengths: Seeking help, began medication again      Assessment Outcome:  Pt has been advised on the following treatment options: Individual therapy       Plan:    Treatment Goals:                 [x]  Increase positive enjoyable/meaningful activities (behavioral activation)        [x]  Develop coping skills (e.g. deep breathing, relaxation)         [x]  Develop and practice anxiety/depression/anger management skills        []  Practice sleep hygiene         []  Practice smoking cessation skills               [x]  Improve functioning and prevent recurrence or worsening of symptoms        []  Increase medication adherence            []  Other:       Plan for follow-up:        [x]  Follow-up visit with integrated BH services scheduled for 1 week        []  Patient referred back to PCP for  (monitoring, psychotropic medication trial)        []  Patient referred to higher level of care (outpatient psychotherapy services, psychiatry, PHP, IOP, IPAC or emergency room for further evaluation)            []  Patient referred to Care Navigation for assistance with community resources         []  Other:       Alferd Apa, LCSW

## 2018-04-21 ENCOUNTER — Ambulatory Visit (INDEPENDENT_AMBULATORY_CARE_PROVIDER_SITE_OTHER): Payer: Commercial Managed Care - POS

## 2018-04-21 DIAGNOSIS — F411 Generalized anxiety disorder: Secondary | ICD-10-CM

## 2018-04-21 DIAGNOSIS — F410 Panic disorder [episodic paroxysmal anxiety] without agoraphobia: Secondary | ICD-10-CM

## 2018-04-21 NOTE — Progress Notes (Signed)
Name:  Derrick Nixon August 05, 1976 Medical Records: [id number]    Time in: 10:00  AM         Time out:  10:45  AM    Therapy session number:  1    Treatment goals: Decrease anxiety levels    DATA:     Met with patient and discussed social, emotional and behavioral functioning. Discussed goals for therapy.  Provided psycho-education on "window of tolerance," and mindfulness.  Encouraged patient to try mindfulness activity daily.  Patient identified triggers: self-doubt, work, relationships.      He continues to experience symptoms of:    Generalized anxiety disorder (3 X 53m):   worry, restlessness, decreased concentration, fatigue and irritability.    He denied suicidal ideation/homicidal ideation/self-harming behaviors.  He denied ETOH or alcohol abuse/dependency.       Interpersonal, solution focused and support therapy strategies were implemented.   Relaxation/stress management training imparted to patient.    ASSESSMENT:     He continues to meet criteria for the GAD (generalized anxiety disorder) diagnosis.     PHQ-9 score: 7 (Mild)  GAD-7 score: 9 (Mild)    PHQ-9 score decreased from 14 to 7 since 04/14/18. GAD-7 decreased from 14 to 9.    PLAN:     Treatment Plan and Goals:    Patient recommendations:        []  Increase positive enjoyable/meaningful activities (behavioral activation)         [x]  Utilize coping skills: mindfulness (be the pebble)        []  Identify triggers and maintaining factors to current sx        []  Practice sleep hygiene         []  Practice smoking cessation skills               []  Increase medication adherence                      []  Other        ProgressTowards Goals - in progress  Outcome of today's visit:        [x]  Implement today's recommendations and follow up in 2 weeks        []  No further visits scheduled at this time     Alferd Apa, LCSW

## 2018-05-08 ENCOUNTER — Ambulatory Visit (INDEPENDENT_AMBULATORY_CARE_PROVIDER_SITE_OTHER): Payer: Commercial Managed Care - POS

## 2018-05-08 DIAGNOSIS — F411 Generalized anxiety disorder: Secondary | ICD-10-CM

## 2018-05-08 DIAGNOSIS — F410 Panic disorder [episodic paroxysmal anxiety] without agoraphobia: Secondary | ICD-10-CM

## 2018-05-08 NOTE — Progress Notes (Signed)
Name:  Derrick Nixon 11/01/1975 Medical Records: [id number]    Time in: 10:03  AM         Time out:  10:45  AM    Therapy session number:  2    Treatment goals: Decrease anxiety levels    DATA:     Met with patient and discussed social, emotional and behavioral functioning. Patient reports some improvement of symptoms associated with anxiety since last session.  Patient attributes this to mindfulness and exercise.  Patient discussed some stress regarding work and whether or not he would like to move back to [place].  Discussed pros/cons and the importance of weighting each based on patient's preferences.  Patient did complete the assignment. Patient noted that it was helpful. Patient identified triggers: work.      He continues to experience symptoms of:    Generalized anxiety disorder (3 X 17m):   worry, decreased concentration, fatigue and irritability.    He denied suicidal ideation/homicidal ideation/self-harming behaviors.  He denied ETOH or alcohol abuse/dependency.       Interpersonal, solution focused and support therapy strategies were implemented.    ASSESSMENT:     He continues to meet criteria for the GAD (generalized anxiety disorder) diagnosis.     PHQ-9 score: 9 (Mild)  GAD-7 score: 5 (Mild)    PHQ-9 score increased from 7 to 9 since 04/21/18. GAD-7 decreased from 9 to 5.    PLAN:     Treatment Plan and Goals:    Patient recommendations:        []  Increase positive enjoyable/meaningful activities (behavioral activation)         [x]  Utilize coping skills: mindfulness        []  Identify triggers and maintaining factors to current sx        []  Practice sleep hygiene         []  Practice smoking cessation skills               []  Increase medication adherence                      []  Other        ProgressTowards Goals - in progress  Outcome of today's visit:        [x]  Implement today's recommendations and follow up in 3 weeks        []  No further visits scheduled at this time     Alferd Apa,  LCSW

## 2018-06-02 ENCOUNTER — Ambulatory Visit (INDEPENDENT_AMBULATORY_CARE_PROVIDER_SITE_OTHER): Payer: Commercial Managed Care - POS

## 2018-06-04 ENCOUNTER — Telehealth: Payer: Self-pay | Admitting: Urology

## 2018-06-04 NOTE — Telephone Encounter (Signed)
Pt called and states his attorney would like for you to write a letter stating that he has been having trouble with impotence, for a divorce case.  This is patient's cell# [phone]

## 2018-06-08 ENCOUNTER — Encounter: Payer: Self-pay | Admitting: Urology

## 2018-06-08 NOTE — Progress Notes (Signed)
Letter for attorney written.

## 2018-06-08 NOTE — Telephone Encounter (Signed)
I have attempted to contact the patient regarding the letter he is asking for to give to his attorney for divorce proceedings but his voice mailbox was full and could not leave a message.  I have written the letter and left it up front for the patient to pick up at his convenience.

## 2018-06-09 DIAGNOSIS — N200 Calculus of kidney: Secondary | ICD-10-CM

## 2018-06-09 HISTORY — DX: Calculus of kidney: N20.0

## 2018-06-16 ENCOUNTER — Ambulatory Visit (INDEPENDENT_AMBULATORY_CARE_PROVIDER_SITE_OTHER): Payer: Self-pay | Admitting: Urology

## 2018-06-17 ENCOUNTER — Other Ambulatory Visit: Payer: Self-pay | Admitting: Internal Medicine

## 2018-07-02 ENCOUNTER — Ambulatory Visit (INDEPENDENT_AMBULATORY_CARE_PROVIDER_SITE_OTHER): Payer: Commercial Managed Care - POS | Admitting: Nurse Practitioner

## 2018-07-07 ENCOUNTER — Ambulatory Visit (INDEPENDENT_AMBULATORY_CARE_PROVIDER_SITE_OTHER): Payer: Self-pay | Admitting: Urology

## 2018-07-07 ENCOUNTER — Ambulatory Visit (INDEPENDENT_AMBULATORY_CARE_PROVIDER_SITE_OTHER): Payer: Commercial Managed Care - POS | Admitting: Nurse Practitioner

## 2018-07-20 DIAGNOSIS — K219 Gastro-esophageal reflux disease without esophagitis: Secondary | ICD-10-CM | POA: Insufficient documentation

## 2018-07-20 DIAGNOSIS — E782 Mixed hyperlipidemia: Secondary | ICD-10-CM | POA: Insufficient documentation

## 2018-07-20 DIAGNOSIS — E7800 Pure hypercholesterolemia, unspecified: Secondary | ICD-10-CM | POA: Insufficient documentation

## 2018-07-20 DIAGNOSIS — I10 Essential (primary) hypertension: Secondary | ICD-10-CM | POA: Insufficient documentation

## 2018-07-20 NOTE — Progress Notes (Signed)
Subjective:      Date: 07/21/2018 4:06 PM   Patient ID: Derrick Nixon is a 42 y.o. male.    Chief Complaint:  Chief Complaint   Patient presents with   . Bloated     X2M   . Hypertension     f/u and med refills.        ABDOMINAL BLOATING:  Pt presents complaining of increasing abdominal bloating over the past 2 months.  He has been trying to eat more fruits and vegetables.  He is concerned he could have an allergy of som ekind.       HYPERTENSION  Visit type: routine follow-up of HTN  Type: essential  Associated Conditions: no evidence of CHF  Last follow-up: months ago  Compliance: is compliant with the current regimen  Comorbid illness: hyperlipidemia and hypertension  (use .MGFRAMINGHAM for Framingham Risk Score documentation)  Interval events: no interval events  Interval symptoms: none  Patient denies: dizziness, chest pain, shortness of breath, palpitations, pre-syncope, edema, DOE, orthopnea or PND  Average ambulatory systolic pressure: 120's  Average ambulatory diastolic pressure: 70's  End organ damage from HTN: none  Lifestyle changes: improved nutrition  Exercise: occasionally  Response to medications: has been good  Prior testing: a chemistry profile showed normal renal function  Additional concerns: In addition, pt also needs medication refill.      Problem List:  Patient Active Problem List   Diagnosis   . Mid back pain   . Anxiety   . Essential hypertension   . Pure hypercholesterolemia   . GERD without esophagitis   . Chronic midline low back pain without sciatica       Current Medications:  Current Outpatient Medications   Medication Sig Dispense Refill   . ALPRAZolam (XANAX) 0.5 MG tablet Take 0.5 mg by mouth as needed     . buPROPion SR (WELLBUTRIN SR) 100 MG 12 hr tablet Take 1 tablet (100 mg total) by mouth daily 180 tablet 1   . omeprazole (PRILOSEC) 40 MG capsule Take 40 mg by mouth as needed        . Pediatric Multivitamins-Fl (MULTIVITAMIN/FLUORIDE) 0.25 MG Chew Tab Chew by mouth     .  sildenafil (REVATIO) 20 MG tablet as needed     . losartan (COZAAR) 50 MG tablet Take 1 tablet (50 mg total) by mouth daily 90 tablet 1     No current facility-administered medications for this visit.        Allergies:  Allergies   Allergen Reactions   . Maple Flavor      Maple tree, and maple syrup: rash, tongue numbness, and swelling.        Past Medical History:  Past Medical History:   Diagnosis Date   . Gastroesophageal reflux disease    . Hypertension    . Kidney stones 06/2018   . Lower back pain        Past Surgical History:  History reviewed. No pertinent surgical history.    Family History:  Family History   Problem Relation Age of Onset   . Cancer Mother    . Diabetes Mother    . Cancer Father        Social History:  Social History     Socioeconomic History   . Marital status: Married     Spouse name: Not on file   . Number of children: Not on file   . Years of education: Not on file   . Highest  education level: Not on file   Occupational History   . Not on file   Social Needs   . Financial resource strain: Not on file   . Food insecurity:     Worry: Not on file     Inability: Not on file   . Transportation needs:     Medical: Not on file     Non-medical: Not on file   Tobacco Use   . Smoking status: Never Smoker   . Smokeless tobacco: Never Used   Substance and Sexual Activity   . Alcohol use: No   . Drug use: No   . Sexual activity: Not Currently   Lifestyle   . Physical activity:     Days per week: Not on file     Minutes per session: Not on file   . Stress: Not on file   Relationships   . Social connections:     Talks on phone: Not on file     Gets together: Not on file     Attends religious service: Not on file     Active member of club or organization: Not on file     Attends meetings of clubs or organizations: Not on file     Relationship status: Not on file   . Intimate partner violence:     Fear of current or ex partner: Not on file     Emotionally abused: Not on file     Physically abused: Not  on file     Forced sexual activity: Not on file   Other Topics Concern   . Not on file   Social History Narrative   . Not on file       The following sections were reviewed this encounter by the provider: Meds           Vitals:  BP 120/75 (BP Site: Right arm, Patient Position: Sitting, Cuff Size: Medium)   Pulse 74   Temp 98.2 F (36.8 C) (Oral)   Wt 100.2 kg (221 lb)   SpO2 96%   BMI 30.82 kg/m       ROS:  General ROS: negative for - chills, fatigue, fever  Ophthalmic ROS: negative for - blurry vision, double vision, eye pain or photophobia  Endocrine ROS: negative for - hot flashes, polydipsia/polyuria, temperature intolerance or unexpected weight changes  Respiratory ROS: negative for - cough, orthopnea, shortness of breath, sputum changes or wheezing  Cardiovascular ROS: negative for - chest pain, edema, irregular heartbeat, palpitations or shortness of breath  Gastrointestinal ROS: negative for - abdominal pain, appetite loss, blood in stools, constipation, diarrhea, heartburn or nausea/vomiting; positive for - abdominal gas/bloating  Genito-Urinary ROS: negative for - dysuria, hematuria or urinary frequency/urgency  Neurological ROS: negative for - dizziness, headaches or numbness/tingling      Objective:       Physical Exam:  General appearance - alert, well appearing, and in no distress, oriented to person, place, and time  Eyes - pupils equal and reactive, extraocular eye movements intact  Mouth - mucous membranes moist, pharynx normal without lesions  Neck - supple, no significant adenopathy, no cervical lymphadenopathy  Chest - clear to auscultation, no wheezes, rales or rhonchi, symmetric air entry  Heart - normal rate, regular rhythm, normal S1, S2, no murmurs, rubs, clicks or gallops  Abdomen - soft, nontender, nondistended, no masses or organomegaly  Neurological - alert, oriented, normal speech, no focal findings or movement disorder noted  Musculoskeletal - no joint  tenderness, deformity or  swelling  Extremities - peripheral pulses normal, no pedal edema, no clubbing or cyanosis  Skin - normal coloration and turgor, no rashes, no suspicious skin lesions noted      Assessment/Plan:       1. Abdominal bloating  - Advised pt to use OTC Gas-X (simethicone)  - Avoid gas-producing foods like beans, broccoli, and cabbage    2. Essential hypertension  - Comprehensive metabolic panel  - losartan (COZAAR) 50 MG tablet; Take 1 tablet (50 mg total) by mouth daily  Dispense: 90 tablet; Refill: 1  -  Will lower Cozaar to 50mg /day from 100mg /day since BP readings are well controlled  -   Asked that pt keep a home blood pressure log for 2 weeks and call/message if readings are high    3. Anxiety  - buPROPion SR (WELLBUTRIN SR) 100 MG 12 hr tablet; Take 1 tablet (100 mg total) by mouth daily  Dispense: 180 tablet; Refill: 1  - Pt stable on current medication regimen, no concerns          Leonette Most Almetta Lovely, DO

## 2018-07-21 ENCOUNTER — Encounter (INDEPENDENT_AMBULATORY_CARE_PROVIDER_SITE_OTHER): Payer: Self-pay | Admitting: Family Medicine

## 2018-07-21 ENCOUNTER — Ambulatory Visit (INDEPENDENT_AMBULATORY_CARE_PROVIDER_SITE_OTHER): Payer: Commercial Managed Care - POS | Admitting: Urology

## 2018-07-21 ENCOUNTER — Encounter (INDEPENDENT_AMBULATORY_CARE_PROVIDER_SITE_OTHER): Payer: Self-pay | Admitting: Urology

## 2018-07-21 ENCOUNTER — Ambulatory Visit (INDEPENDENT_AMBULATORY_CARE_PROVIDER_SITE_OTHER): Payer: Commercial Managed Care - POS | Admitting: Family Medicine

## 2018-07-21 ENCOUNTER — Telehealth (INDEPENDENT_AMBULATORY_CARE_PROVIDER_SITE_OTHER): Payer: Self-pay | Admitting: Family Medicine

## 2018-07-21 ENCOUNTER — Ambulatory Visit (INDEPENDENT_AMBULATORY_CARE_PROVIDER_SITE_OTHER): Payer: Commercial Managed Care - POS | Admitting: Nurse Practitioner

## 2018-07-21 ENCOUNTER — Encounter (INDEPENDENT_AMBULATORY_CARE_PROVIDER_SITE_OTHER): Payer: Self-pay | Admitting: Nurse Practitioner

## 2018-07-21 VITALS — BP 120/75 | HR 74 | Temp 98.2°F | Wt 221.0 lb

## 2018-07-21 VITALS — BP 124/80 | HR 62 | Ht 71.0 in | Wt 209.0 lb

## 2018-07-21 VITALS — BP 115/72 | HR 74 | Temp 98.7°F | Resp 16 | Ht 71.0 in | Wt 217.8 lb

## 2018-07-21 DIAGNOSIS — I10 Essential (primary) hypertension: Secondary | ICD-10-CM

## 2018-07-21 DIAGNOSIS — F419 Anxiety disorder, unspecified: Secondary | ICD-10-CM

## 2018-07-21 DIAGNOSIS — M549 Dorsalgia, unspecified: Secondary | ICD-10-CM

## 2018-07-21 DIAGNOSIS — R140 Abdominal distension (gaseous): Secondary | ICD-10-CM

## 2018-07-21 DIAGNOSIS — M545 Low back pain, unspecified: Secondary | ICD-10-CM | POA: Insufficient documentation

## 2018-07-21 DIAGNOSIS — N200 Calculus of kidney: Secondary | ICD-10-CM

## 2018-07-21 DIAGNOSIS — G8929 Other chronic pain: Secondary | ICD-10-CM

## 2018-07-21 LAB — BASIC METABOLIC PANEL
BUN: 16 mg/dL (ref 9.0–28.0)
CO2: 30 mEq/L — ABNORMAL HIGH (ref 21–29)
Calcium: 9.5 mg/dL (ref 8.5–10.5)
Chloride: 103 mEq/L (ref 100–111)
Creatinine: 1 mg/dL (ref 0.5–1.5)
Glucose: 82 mg/dL (ref 70–100)
Potassium: 4.1 mEq/L (ref 3.5–5.1)
Sodium: 141 mEq/L (ref 136–145)

## 2018-07-21 LAB — URINE MICROSCOPIC: Urine Bacteria: NONE SEEN /hpf

## 2018-07-21 LAB — PTH, INTACT: PTH Intact: 62.8 pg/mL (ref 9.0–72.0)

## 2018-07-21 LAB — URINALYSIS POC
POCT Urine Bilirubin: NEGATIVE
POCT Urine Glucose: NEGATIVE mg/dL
POCT Urine Ketones: NEGATIVE mg/dL
POCT Urine Nitrites: NEGATIVE
POCT Urine Urobilibogen: 0.2 mg/dL (ref 0.0–1.0)
POCT Urine pH: 5.5 (ref 5.0–8.0)
Protein, UR POCT: NEGATIVE mg/dL
Urine Specific Gravity POC: 1.02 (ref 1.001–1.035)
Urine leukocyte Esterase, POCT: NEGATIVE

## 2018-07-21 LAB — GFR: EGFR: >60

## 2018-07-21 LAB — URIC ACID: Uric acid: 7.4 mg/dL (ref 3.6–9.7)

## 2018-07-21 LAB — HEMOLYSIS INDEX: Hemolysis Index: 6 (ref 0–18)

## 2018-07-21 MED ORDER — BUPROPION HCL ER (SR) 100 MG PO TB12
100.00 mg | ORAL_TABLET | Freq: Every day | ORAL | 1 refills | Status: DC
Start: 2018-07-21 — End: 2018-07-21

## 2018-07-21 MED ORDER — LOSARTAN POTASSIUM 50 MG PO TABS
50.0000 mg | ORAL_TABLET | Freq: Every day | ORAL | 1 refills | Status: DC
Start: 2018-07-21 — End: 2018-10-13

## 2018-07-21 MED ORDER — BUPROPION HCL ER (SR) 100 MG PO TB12
100.00 mg | ORAL_TABLET | Freq: Two times a day (BID) | ORAL | 1 refills | Status: DC
Start: 2018-07-21 — End: 2018-10-06

## 2018-07-21 NOTE — Progress Notes (Signed)
Subjective:      Patient ID:  Derrick Nixon is a 42 y.o.M referred for urologic evaluation by self    Chief Complaint:  1. Kidney stone  Stone analysis    Basic Metabolic Panel    Uric acid    PTH, Intact    US Renal Kidney Bladder Complete    UroStone Max 24 Hr, One 24Hr Collection        Patient is here for evaluation of kidney stone.  About 1 month ago he developed left flank pain.  He was seen at an urgent care and passed a stone during that visit.  He brings it with him today.  He reports pain has resolved since that time.  He has a history of kidney stones in the past most recently about 6 years ago and once prior to that.  He has not had a metabolic work-up.  He has not had any intervention.  He denies any gross hematuria but has had some orange color in his urine recently.    The following portions of the patient's history were reviewed and updated as appropriate: allergies, current medications, past family history, past medical history, past social history, past surgical history and problem list.    Review of Systems  History obtained from the patient  General ROS: negative for - chills, fatigue, fever or weight loss  Psychological ROS: +anxiety  Ophthalmic ROS: negative for - blurry vision or loss of vision  Hematological and Lymphatic ROS: negative for - bleeding problems, night sweats or swollen lymph nodes  Endocrine ROS: negative for - malaise/lethargy, polydipsia/polyuria or skin changes  Respiratory ROS: no cough, shortness of breath, or wheezing  Cardiovascular ROS: no chest pain or dyspnea on exertion  Gastrointestinal ROS: no abdominal pain, change in bowel habits, or black or bloody stools  Genito-Urinary ROS: no dysuria, trouble voiding, or hematuria  Musculoskeletal ROS: negative for - joint pain or muscle pain  Neurological ROS: negative for - headaches, impaired coordination/balance or numbness/tingling       Objective:     Vitals:    07/21/18 1325   BP: 124/80   Pulse: 62     General  appearance - alert, well appearing, and in no distress  Mental status - alert, oriented to person, place, and time  Chest - clear to auscultation, no wheezes, rales or rhonchi, symmetric air entry  Heart - normal rate and regular rhythm  Abdomen - soft, nontender, nondistended, no masses or organomegaly  Musculoskeletal - no joint tenderness, deformity or swelling  Extremities - peripheral pulses normal, no pedal edema, no clubbing or cyanosis      Lab Review   Urine analysis shows   Results     Procedure Component Value Units Date/Time    PTH, Intact [[id number]] Collected:  07/21/18 1408    Specimen:  Blood Updated:  07/21/18 1409    Narrative:       Has the patient fasted?->No    Larina Bras analysis [[id number]] Collected:  07/21/18 1408    Specimen:  Other Updated:  07/21/18 1409    Narrative:       Has the patient fasted?->No    Basic Metabolic Panel [[id number]] Collected:  07/21/18 1408    Specimen:  Blood Updated:  07/21/18 1409    Narrative:       Has the patient fasted?->No    Uric acid [[id number]] Collected:  07/21/18 1408    Specimen:  Blood Updated:  07/21/18 1409    Narrative:  Has the patient fasted?->No    Urinalysis POC [[id number]]  (Abnormal) Collected:  07/21/18 1312     Updated:  07/21/18 1315     POCT Urine Color Yellow     POCT Urine Clarity Clear     POCT Urine pH 5.5     Urine leukocyte Esterase, POCT Negative     POCT Urine Nitrites Negative     Protein, UR POCT Negative mg/dL      POCT Urine Glucose Negative mg/dL      POCT Urine Ketones Negative mg/dL      POCT Urine Urobilibogen 0.2 mg/dL      POCT Urine Bilirubin Negative     Blood, UA POCT Moderate     Urine Specific Gravity POC 1.020          Radiology Review   none    Assessment:     Passed kidney stone, h/o recurrent stones     Plan:     -Stone analysis  -Metabolic work-up  -Renal ultrasound  -Follow-up 8 weeks    Orders  Orders Placed This Encounter   Procedures   . US Renal Kidney Bladder Complete     Standing Status:   Future      Standing Expiration Date:   07/22/2019     Order Specific Question:   Clinical info for radiologist     Answer:   follow kidney stone   . Urinalysis POC   . Microscopic, Urine   . Stone analysis   . Basic Metabolic Panel     Order Specific Question:   Has the patient fasted?     Answer:   No   . Uric acid   . PTH, Intact   . UroStone Max 24 Hr, One 24Hr Collection     Order Specific Question:   Patient Height (inches):     Answer:   21     Order Specific Question:   Patient Weight (lbs):     Answer:   209   . Hemolysis index     Has the patient fasted?->No   . GFR     Has the patient fasted?->No

## 2018-07-21 NOTE — Telephone Encounter (Signed)
Resent rx

## 2018-07-21 NOTE — Progress Notes (Signed)
Have you seen any specialists/other providers since your last visit with us?    No    Arm preference verified?   Yes    The patient is due for [facility] letter.

## 2018-07-21 NOTE — Patient Instructions (Signed)
-   Discussed evaluation for stone formation    - We sent your labwork today    - We analyzed your stone    - You will receive a collection kit for performing a 24 hour urine collection in the mail.  Please follow directions.  If you do not receive in 2 weeks please call the office

## 2018-07-21 NOTE — Telephone Encounter (Signed)
[facility] pharmacy called to clarify the directions on the rx Bupropion SR 100mg .    Pharmacist said it is usually prescribed for 2x/day and said the patient used to take it 2x/day.    Thank you.

## 2018-07-21 NOTE — Progress Notes (Addendum)
[facility]  New Patient Note  Primary Care MD: Peterson Ao  MRN: [id number]  HPI   CC: Back pain  HPI    Patient ID: Derrick Nixon is a 42 y.o.male, DOB June 03, 1976 who presented to our clinic for evaluation of his back pain. He complains of mid back pain when he walks or stands for a long period of time. His pain is mild, not radiating, aggravates with walking and standing and alleviates with sitting. He denies legs pain, numbness and tingling, bladder and bowel dysfunction, imbalance.   He tried chiropractor treatment with no help, he occasionally takes muscle relaxant or iboprufen. He denies PT or pain management.    Medical History     Past Medical History:   Diagnosis Date   . Gastroesophageal reflux disease    . Hypertension    . Lower back pain        Surgical History   History reviewed. No pertinent surgical history.    Family History     Family History   Problem Relation Age of Onset   . Cancer Mother    . Diabetes Mother    . Cancer Father         Social History     Social History     Socioeconomic History   . Marital status: Married     Spouse name: Not on file   . Number of children: Not on file   . Years of education: Not on file   . Highest education level: Not on file   Occupational History   . Not on file   Social Needs   . Financial resource strain: Not on file   . Food insecurity:     Worry: Not on file     Inability: Not on file   . Transportation needs:     Medical: Not on file     Non-medical: Not on file   Tobacco Use   . Smoking status: Never Smoker   . Smokeless tobacco: Never Used   Substance and Sexual Activity   . Alcohol use: No   . Drug use: No   . Sexual activity: Not Currently   Lifestyle   . Physical activity:     Days per week: Not on file     Minutes per session: Not on file   . Stress: Not on file   Relationships   . Social connections:     Talks on phone: Not on file     Gets together: Not on file     Attends religious service: Not on  file     Active member of club or organization: Not on file     Attends meetings of clubs or organizations: Not on file     Relationship status: Not on file   . Intimate partner violence:     Fear of current or ex partner: Not on file     Emotionally abused: Not on file     Physically abused: Not on file     Forced sexual activity: Not on file   Other Topics Concern   . Not on file   Social History Narrative   . Not on file       Current Medications     Current Outpatient Medications   Medication Sig Dispense Refill   . buPROPion SR (WELLBUTRIN SR) 100 MG 12 hr tablet TAKE 1 TABLET BY MOUTH 2 TIMES DAILY     . Losartan  Potassium (COZAAR PO) Take by mouth     . omeprazole (PRILOSEC) 40 MG capsule Take 40 mg by mouth     . ALPRAZolam (XANAX) 0.5 MG tablet Take 0.5 mg by mouth as needed     . Pediatric Multivitamins-Fl (MULTIVITAMIN/FLUORIDE) 0.25 MG Chew Tab Chew by mouth       No current facility-administered medications for this visit.        Allergies     Allergies   Allergen Reactions   . Maple Flavor      Maple tree, and maple syrup: rash, tongue numbness, and swelling.        Review of Systems   Review of Systems  Constitutional: Positive for activity change.   HENT: Positive for postnasal drip.    Eyes: Negative.    Gastrointestinal: Positive for abdominal distention.   Genitourinary: Negative.    Musculoskeletal: Positive for arthralgias, back pain, gait problem, myalgias and neck stiffness.   Skin: Negative.    Neurological:        Negative   Hematological: Negative.    Psychiatric/Behavioral: Positive for sleep disturbance.     Physical Examination   VITAL SIGNS:   height is 1.803 m (5\' 11" ) and weight is 98.8 kg (217 lb 12.8 oz). His oral temperature is 98.7 F (37.1 C). His blood pressure is 115/72 and his pulse is 74. His respiration is 16.          General:  Well developed, well nourished, no apparent distress  Neck:  Supple, no JVD, no apparent lymphadenopathy  HEENT:  Head normocephalic, atraumatic,  no obvious lesions in ear, nose or throat  Chest:  Equal chest rise.  No wheezes, rales or rhonchi.  Skin:  No obvious lesions or scars  Extremities:  Without clubbing or cyanosis    Neurologic Exam  Awake, alert, oriented x3, Follows commands  GCS: 15   Speech is clear  Attention span and concentration: intact  Recent and remote memory: intact    Motor Exam:     R L   L2-3 Iliopsoas (Hip Flexion)  5 5  L3-4 Quadriceps (Knee Extension)  5 5  L5-S1 Hamstring (Knee Flexion)  5 5  L4-5 Tibialis Anterior (Foot Dorsiflexion) 5 5  S1 Gastrocsoleus (Plantar Flexion) 5 5  L5 EHL (Toe Dorsiflexion)  5 5  Point tenderness: None   Sensation intact bilaterlly  Patellar (L3-4): right 2+, left 2+,  Clonus: right negative, left negative   Hoffman's: right negative, left negative  Faber's sign: right negative, left negative      Radiology Interpretation   No results found.    Impression   32 yoM with back pain, low and mid back for many years progressively got worse. I recommended to have MRI t and l spine to evaluate for any disc herniation. I also refer him to PT while waiting for MRI.       Plan     Orders Placed This Encounter   Procedures   . MRI Lumbar Spine WO Contrast     Standing Status:   Future     Standing Expiration Date:   07/22/2019     Order Specific Question:   Does the patient have a pacemaker or defibrillator?     Answer:   No     Order Specific Question:   Clinical info for radiologist     Answer:   back pain   . MRI Thoracic Spine WO Contrast     Standing Status:  Future     Standing Expiration Date:   07/22/2019     Order Specific Question:   Does the patient have a pacemaker or defibrillator?     Answer:   No     Order Specific Question:   Clinical info for radiologist     Answer:   mid back pain   . Referral to Physical Therapy     Referral Priority:   Routine     Referral Type:   Consultation     Referral Reason:   Specialty Services Required     Requested Specialty:   Physical Therapy     Number of Visits  Requested:   1     Follow-up  After MRI   Demetra Shiner, DNP NP

## 2018-07-21 NOTE — Progress Notes (Signed)
Review of Systems   Constitutional: Positive for activity change.   HENT: Positive for postnasal drip.    Eyes: Negative.    Gastrointestinal: Positive for abdominal distention.   Genitourinary: Negative.    Musculoskeletal: Positive for arthralgias, back pain, gait problem, myalgias and neck stiffness.   Skin: Negative.    Neurological:        Negative   Hematological: Negative.    Psychiatric/Behavioral: Positive for sleep disturbance.

## 2018-07-22 ENCOUNTER — Encounter (INDEPENDENT_AMBULATORY_CARE_PROVIDER_SITE_OTHER): Payer: Self-pay

## 2018-07-22 LAB — COMPREHENSIVE METABOLIC PANEL
ALT: 19 U/L (ref 0–55)
AST (SGOT): 20 U/L (ref 5–34)
Albumin/Globulin Ratio: 1 (ref 0.9–2.2)
Albumin: 3.6 g/dL (ref 3.5–5.0)
Alkaline Phosphatase: 50 U/L (ref 38–106)
BUN: 16 mg/dL (ref 9.0–28.0)
Bilirubin, Total: 0.7 mg/dL (ref 0.2–1.2)
CO2: 26 mEq/L (ref 21–29)
Calcium: 9.3 mg/dL (ref 8.5–10.5)
Chloride: 104 mEq/L (ref 100–111)
Creatinine: 1.2 mg/dL (ref 0.5–1.5)
Globulin: 3.6 g/dL (ref 2.0–3.7)
Glucose: 116 mg/dL — ABNORMAL HIGH (ref 70–100)
Potassium: 3.5 mEq/L (ref 3.5–5.1)
Protein, Total: 7.2 g/dL (ref 6.0–8.3)
Sodium: 141 mEq/L (ref 136–145)

## 2018-07-22 LAB — GFR: EGFR: >60

## 2018-07-22 LAB — HEMOLYSIS INDEX: Hemolysis Index: 7 (ref 0–18)

## 2018-07-24 LAB — STONE ANALYSIS

## 2018-09-21 ENCOUNTER — Ambulatory Visit
Admission: RE | Admit: 2018-09-21 | Discharge: 2018-09-21 | Disposition: A | Discharge status: Home / Self Care | Payer: BC Managed Care – PPO | Source: Ambulatory Visit | Attending: Urology | Admitting: Urology

## 2018-09-21 DIAGNOSIS — N281 Cyst of kidney, acquired: Secondary | ICD-10-CM | POA: Insufficient documentation

## 2018-09-21 DIAGNOSIS — N200 Calculus of kidney: Secondary | ICD-10-CM | POA: Insufficient documentation

## 2018-09-21 DIAGNOSIS — N2889 Other specified disorders of kidney and ureter: Secondary | ICD-10-CM | POA: Insufficient documentation

## 2018-09-22 ENCOUNTER — Ambulatory Visit (INDEPENDENT_AMBULATORY_CARE_PROVIDER_SITE_OTHER): Payer: BC Managed Care – PPO | Admitting: Urology

## 2018-09-22 ENCOUNTER — Encounter (INDEPENDENT_AMBULATORY_CARE_PROVIDER_SITE_OTHER): Payer: Self-pay | Admitting: Urology

## 2018-09-22 VITALS — BP 124/87 | HR 57 | Ht 71.0 in | Wt 210.0 lb

## 2018-09-22 DIAGNOSIS — N200 Calculus of kidney: Secondary | ICD-10-CM

## 2018-09-22 LAB — URINALYSIS POC
POCT Urine Bilirubin: NEGATIVE
POCT Urine Glucose: NEGATIVE mg/dL
POCT Urine Ketones: NEGATIVE mg/dL
POCT Urine Nitrites: NEGATIVE
POCT Urine Urobilibogen: 0.2 mg/dL (ref 0.0–1.0)
POCT Urine pH: 5.5 (ref 5.0–8.0)
Protein, UR POCT: NEGATIVE mg/dL
Urine Specific Gravity POC: 1.015 (ref 1.001–1.035)
Urine leukocyte Esterase, POCT: NEGATIVE

## 2018-09-22 LAB — URINE MICROSCOPIC: Urine Bacteria: NONE SEEN /hpf

## 2018-09-22 NOTE — Patient Instructions (Signed)
-   PLease have CT scan done.  I will call you with results     - - Your stone was made of calcium oxalate.  There are a number of reasons you may have made this stone.  Some of the factors may be diet related. We talked about general stone prevention measures today.  These include high fluid intake, low sodium, low animal protein and normal calcium intake

## 2018-09-22 NOTE — Progress Notes (Signed)
Subjective:      Patient ID:  Derrick Nixon is a 43 y.o.M returns for follow up     Chief Complaint:  1. Kidney stone  CT KUB        Patient is here for evaluation of kidney stone.  About 1 month ago he developed left flank pain.  He was seen at an urgent care and passed a stone during that visit.  He brings it with him today.  He reports pain has resolved since that time.  He has a history of kidney stones in the past most recently about 6 years ago and once prior to that.  He has not had a metabolic work-up.  He has not had any intervention.  He denies any gross hematuria but has had some orange color in his urine recently.    Since his last visit with me in November 2019 he had follow-up upper renal ultrasound which showed mild fullness of the left renal collecting system without clear evidence of a stone and a 1.4 cm thinly septated cyst.  He did not complete the 24-hour urine collection.  He has no complaints.     The following portions of the patient's history were reviewed and updated as appropriate: allergies, current medications, past family history, past medical history, past social history, past surgical history and problem list.    Review of Systems  History obtained from the patient  General ROS: negative for - chills, fatigue, fever or weight loss  Psychological ROS: negative for - depression, disorientation or memory difficulties  Ophthalmic ROS: negative for - blurry vision or loss of vision  Hematological and Lymphatic ROS: negative for - bleeding problems, night sweats or swollen lymph nodes  Endocrine ROS: negative for - malaise/lethargy, polydipsia/polyuria or skin changes  Respiratory ROS: no cough, shortness of breath, or wheezing  Cardiovascular ROS: no chest pain or dyspnea on exertion  Gastrointestinal ROS: no abdominal pain, change in bowel habits, or black or bloody stools  Genito-Urinary ROS: no dysuria, trouble voiding, or hematuria  Musculoskeletal ROS: negative for - joint pain or  muscle pain  Neurological ROS: negative for - headaches, impaired coordination/balance or numbness/tingling       Objective:     Vitals:    09/22/18 0942   BP: 124/87   Pulse: (!) 57     General appearance - alert, well appearing, and in no distress  Mental status - alert, oriented to person, place, and time  Chest - clear to auscultation, no wheezes, rales or rhonchi, symmetric air entry  Heart - normal rate and regular rhythm  Abdomen - soft, nontender, nondistended, no masses or organomegaly  Musculoskeletal - no joint tenderness, deformity or swelling  Extremities - peripheral pulses normal, no pedal edema, no clubbing or cyanosis      Lab Review   Urine analysis shows   Results     Procedure Component Value Units Date/Time    Urinalysis POC [[id number]]  (Abnormal) Collected:  09/22/18 0957     Updated:  09/22/18 0959     POCT Urine Color Yellow     POCT Urine Clarity Clear     POCT Urine pH 5.5     Urine leukocyte Esterase, POCT Negative     POCT Urine Nitrites Negative     Protein, UR POCT Negative mg/dL      POCT Urine Glucose Negative mg/dL      POCT Urine Ketones Negative mg/dL      POCT Urine Urobilibogen 0.2  mg/dL      POCT Urine Bilirubin Negative     Blood, UA POCT Moderate     Urine Specific Gravity POC 1.015            Radiology Review   RUS as above     Assessment:     S/p passed L ureteral stone with persistent fullness of L collecting system  Recurrent stone former, not interested in metabolic work up at the current time  Bosniak II renal cyst, no additional follow up indicated     Plan:     - CT KUB  - If normal can FU prn     Orders  Orders Placed This Encounter   Procedures    CT KUB     Standing Status:   Future     Standing Expiration Date:   09/23/2019     Scheduling Instructions:      Low Dose CT KUB for stone disease please.     Order Specific Question:   Clinical info for radiologist     Answer:   follow up kidney stones, fullness of left collecting system    Urinalysis POC     Microscopic, Urine

## 2018-09-24 ENCOUNTER — Encounter (INDEPENDENT_AMBULATORY_CARE_PROVIDER_SITE_OTHER): Payer: Self-pay | Admitting: Nurse Practitioner

## 2018-10-06 ENCOUNTER — Other Ambulatory Visit: Payer: Self-pay

## 2018-10-06 ENCOUNTER — Encounter (INDEPENDENT_AMBULATORY_CARE_PROVIDER_SITE_OTHER): Payer: Self-pay | Admitting: Nurse Practitioner

## 2018-10-06 ENCOUNTER — Ambulatory Visit: Admission: RE | Admit: 2018-10-06 | Payer: Self-pay | Source: Ambulatory Visit

## 2018-10-06 ENCOUNTER — Ambulatory Visit (INDEPENDENT_AMBULATORY_CARE_PROVIDER_SITE_OTHER): Payer: BC Managed Care – PPO | Admitting: Nurse Practitioner

## 2018-10-06 VITALS — BP 135/81 | HR 76 | Temp 97.3°F | Ht 71.0 in | Wt 217.0 lb

## 2018-10-06 DIAGNOSIS — M549 Dorsalgia, unspecified: Secondary | ICD-10-CM

## 2018-10-06 NOTE — Progress Notes (Signed)
[facility] Neurosurgery  Follow up note:  Primary Care MD: Peterson Ao  MRN: [id number]  HPI   CC: Back pain  HPI    Patient ID: Derrick Nixon is a 43 y.o.male, DOB [birth year] who was last seen in our clinic 2 months ago for mild mid back pain when we recommended to have MRI T and L spine and PT. He rtc today for discussing the results of MRIs.  He states that at this point, his back pain is resolved and contributes it to have a lot of stress at that time. He complains of mild left leg pain laterally since yesterday.   He states that he hasn't started his PT and doing some exercises from you tube.  Medical History     Past Medical History:   Diagnosis Date    Gastroesophageal reflux disease     Hypertension     Kidney stones 06/2018    Lower back pain        Surgical History   History reviewed. No pertinent surgical history.    Family History     Family History   Problem Relation Age of Onset    Cancer Mother     Diabetes Mother     Cancer Father         Social History     Social History     Socioeconomic History    Marital status: Legally Separated     Spouse name: Not on file    Number of children: Not on file    Years of education: Not on file    Highest education level: Not on file   Occupational History    Not on file   Social Needs    Financial resource strain: Not on file    Food insecurity:     Worry: Not on file     Inability: Not on file    Transportation needs:     Medical: Not on file     Non-medical: Not on file   Tobacco Use    Smoking status: Never Smoker    Smokeless tobacco: Never Used   Substance and Sexual Activity    Alcohol use: No    Drug use: No    Sexual activity: Not Currently   Lifestyle    Physical activity:     Days per week: Not on file     Minutes per session: Not on file    Stress: Not on file   Relationships    Social connections:     Talks on phone: Not on file     Gets together: Not on file     Attends religious service: Not on  file     Active member of club or organization: Not on file     Attends meetings of clubs or organizations: Not on file     Relationship status: Not on file    Intimate partner violence:     Fear of current or ex partner: Not on file     Emotionally abused: Not on file     Physically abused: Not on file     Forced sexual activity: Not on file   Other Topics Concern    Not on file   Social History Narrative    Not on file       Current Medications     Current Outpatient Medications   Medication Sig Dispense Refill    ALPRAZolam (XANAX) 0.5 MG tablet Take 0.5 mg  by mouth as needed      losartan (COZAAR) 50 MG tablet Take 1 tablet (50 mg total) by mouth daily 90 tablet 1    omeprazole (PRILOSEC) 40 MG capsule Take 40 mg by mouth as needed         Pediatric Multivitamins-Fl (MULTIVITAMIN/FLUORIDE) 0.25 MG Chew Tab Chew by mouth      sildenafil (REVATIO) 20 MG tablet as needed       No current facility-administered medications for this visit.        Allergies     Allergies   Allergen Reactions    Maple Flavor      Maple tree, and maple syrup: rash, tongue numbness, and swelling.        Review of Systems   Review of Systems  Constitutional: Positive for activity change.   HENT: Positive for postnasal drip.    Eyes: Negative.    Gastrointestinal: Positive for abdominal distention.   Genitourinary: Negative.    Musculoskeletal: Positive for arthralgias, back pain, gait problem, myalgias and neck stiffness.   Skin: Negative.    Neurological:        Negative   Hematological: Negative.    Psychiatric/Behavioral: Positive for sleep disturbance.     Physical Examination   VITAL SIGNS:   height is 1.803 m (5\' 11" ) and weight is 98.4 kg (217 lb). His oral temperature is 97.3 F (36.3 C). His blood pressure is 135/81 and his pulse is 76.          General:  Well developed, well nourished, no apparent distress  Neck:  Supple, no JVD, no apparent lymphadenopathy  HEENT:  Head normocephalic, atraumatic, no obvious lesions in  ear, nose or throat  Chest:  Equal chest rise.  No wheezes, rales or rhonchi.  Skin:  No obvious lesions or scars  Extremities:  Without clubbing or cyanosis    Neurologic Exam  Awake, alert, oriented x3, Follows commands  GCS: 15   Speech is clear  Attention span and concentration: intact  Recent and remote memory: intact    Motor Exam:     R L   L2-3 Iliopsoas (Hip Flexion)  5 5  L3-4 Quadriceps (Knee Extension)  5 5  L5-S1 Hamstring (Knee Flexion)  5 5  L4-5 Tibialis Anterior (Foot Dorsiflexion) 5 5  S1 Gastrocsoleus (Plantar Flexion) 5 5  L5 EHL (Toe Dorsiflexion)  5 5  Point tenderness: None   Sensation intact bilaterlly  Patellar (L3-4): right 2+, left 2+,  Clonus: right negative, left negative   Hoffman's: right negative, left negative  Faber's sign: right negative, left negative      Radiology Interpretation   MRI L spine wo contrast at [facility] 09/12/2018:  1- very mild multilevel degenerative disc bulging and facet arthropathy, without evidence of significant spinal stenosis or focal nerve root impingement  2- Very mild degenerative retrolisthesis at the L1-2 level    MRI T spine wo contrast at [facility] 09/12/2018:  1- Mild multilevel degenerative disc bulging, spondylosis and facet arthropathy resulting in mild central canal stenosis with cord impingement at the T9-10 and T10-11 levels, very mild central canal stenosis at the T8-9 level, and mild to moderate multilevel foraminal stenosis, without evidence of nerve root impingement.  2- small to moderate sized midline T5-6 disc extrusion causing mild ventral cord flattening      Impression   42 yoM with back pain, low and mid back for many years, his symptoms are resolved since last visit. I reviewed  his MRIs with him which show mild to moderate degrees of disc bulge, DDD. I recommended to start PT.     Plan     No orders of the defined types were placed in this encounter.    Follow-up  As needed   Demetra Shiner, DNP NP

## 2018-10-07 ENCOUNTER — Encounter (INDEPENDENT_AMBULATORY_CARE_PROVIDER_SITE_OTHER): Payer: Self-pay | Admitting: Nurse Practitioner

## 2018-10-08 ENCOUNTER — Other Ambulatory Visit (INDEPENDENT_AMBULATORY_CARE_PROVIDER_SITE_OTHER): Payer: Self-pay | Admitting: Nurse Practitioner

## 2018-10-08 DIAGNOSIS — M549 Dorsalgia, unspecified: Secondary | ICD-10-CM

## 2018-10-08 DIAGNOSIS — M545 Low back pain, unspecified: Secondary | ICD-10-CM

## 2018-10-12 NOTE — Progress Notes (Signed)
Subjective:      Date: 10/13/2018 9:26 AM   Patient ID: Derrick Nixon is a 43 y.o. male.    Chief Complaint:  Chief Complaint   Patient presents with    Hypertension     f/u; would like blood work for std testing, recently not w/ the same partner.        HYPERTENSION  Visit type: routine follow-up of HTN  Type: essential  Associated Conditions: no evidence of CHF  Last follow-up: months ago  Compliance: is compliant with the current regimen  Comorbid illness: hypertension, hyperlipidemia  Interval events: no interval events  Interval symptoms: none  Patient denies: dizziness, chest pain, shortness of breath, palpitations, pre-syncope, edema, DOE, orthopnea or PND  Average ambulatory systolic pressure: 110's  Average ambulatory diastolic pressure: 70's  End organ damage from HTN: none  Lifestyle changes: improved nutrition  Exercise: occasionally  Response to medications: has been good  Prior testing: a chemistry profile showed normal renal function      HYPERLIPIDEMIA  Visit type: follow-up - routine clinic  Type: Pure hypercholesterolemia  Last follow-up: months ago  Compliance: not currently on lipid-lowering medication  Co-morbid illnesses include: hyperlipidemia and hypertension  Last lipid panel results:   Lab Results   Component Value Date    CHOL 223 (H) 04/09/2018    TRIG 101 04/09/2018    HDL 41 04/09/2018    LDL 162 (H) 04/09/2018    CHOLHDLRATIO 5.4 04/09/2018     (use .MGFRAMINGHAM for Framingham Risk Score documentation)  Interval symptoms: none  Patient denies: chest pain and dyspnea  Lifestyle changes: inconsistent compliance with  Exercise: occasionally  Past medication(s): no Rx medication    Pt also requests STD screening as he has a new partner.  Asymptomatic.      Problem List:  Patient Active Problem List   Diagnosis    Mid back pain    Anxiety    Essential hypertension    Pure hypercholesterolemia    GERD without esophagitis    Chronic midline low back pain without sciatica        Current Medications:  Current Outpatient Medications   Medication Sig Dispense Refill    losartan (COZAAR) 50 MG tablet Take 1 tablet (50 mg total) by mouth daily 90 tablet 1    omeprazole (PRILOSEC) 40 MG capsule Take 40 mg by mouth as needed          No current facility-administered medications for this visit.        Allergies:  Allergies   Allergen Reactions    Maple Flavor      Maple tree, and maple syrup: rash, tongue numbness, and swelling.        Past Medical History:  Past Medical History:   Diagnosis Date    Gastroesophageal reflux disease     Hypertension     Kidney stones 06/2018    Lower back pain        Past Surgical History:  History reviewed. No pertinent surgical history.    Family History:  Family History   Problem Relation Age of Onset    Cancer Mother     Diabetes Mother     Cancer Father        Social History:  Social History     Socioeconomic History    Marital status: Legally Separated     Spouse name: Not on file    Number of children: Not on file    Years of education: Not  on file    Highest education level: Not on file   Occupational History    Not on file   Social Needs    Financial resource strain: Not on file    Food insecurity:     Worry: Not on file     Inability: Not on file    Transportation needs:     Medical: Not on file     Non-medical: Not on file   Tobacco Use    Smoking status: Never Smoker    Smokeless tobacco: Never Used   Substance and Sexual Activity    Alcohol use: No    Drug use: No    Sexual activity: Not Currently   Lifestyle    Physical activity:     Days per week: Not on file     Minutes per session: Not on file    Stress: Not on file   Relationships    Social connections:     Talks on phone: Not on file     Gets together: Not on file     Attends religious service: Not on file     Active member of club or organization: Not on file     Attends meetings of clubs or organizations: Not on file     Relationship status: Not on file     Intimate partner violence:     Fear of current or ex partner: Not on file     Emotionally abused: Not on file     Physically abused: Not on file     Forced sexual activity: Not on file   Other Topics Concern    Not on file   Social History Narrative    Not on file       The following sections were reviewed this encounter by the provider: Meds              Vitals:  BP 112/70 (BP Site: Right arm, Patient Position: Sitting, Cuff Size: Medium)    Pulse 67    Temp 97.4 F (36.3 C) (Oral)    Wt 99.8 kg (220 lb)    SpO2 96%    BMI 30.68 kg/m       ROS:  General ROS: negative for - chills, fatigue, fever  Ophthalmic ROS: negative for - blurry vision, double vision, eye pain or photophobia  Endocrine ROS: negative for - hot flashes, polydipsia/polyuria, temperature intolerance or unexpected weight changes  Respiratory ROS: negative for - cough, orthopnea, shortness of breath, sputum changes or wheezing  Cardiovascular ROS: negative for - chest pain, edema, irregular heartbeat, palpitations or shortness of breath  Gastrointestinal ROS: negative for - abdominal pain, appetite loss, blood in stools, constipation, diarrhea, gas/bloating, heartburn or nausea/vomiting  Genito-Urinary ROS: negative for - dysuria, hematuria or urinary frequency/urgency  Neurological ROS: negative for - dizziness, headaches or numbness/tingling         Objective:       Physical Exam:  General appearance - alert, well appearing, and in no distress, oriented to person, place, and time  Eyes - pupils equal and reactive, extraocular eye movements intact  Mouth - mucous membranes moist, pharynx normal without lesions  Neck - supple, no significant adenopathy, no cervical lymphadenopathy  Chest - clear to auscultation, no wheezes, rales or rhonchi, symmetric air entry  Heart - normal rate, regular rhythm, normal S1, S2, no murmurs, rubs, clicks or gallops  Abdomen - soft, nontender, nondistended, no masses or organomegaly  Neurological - alert,  oriented,  normal speech, no focal findings or movement disorder noted  Musculoskeletal - no joint tenderness, deformity or swelling  Extremities - peripheral pulses normal, no pedal edema, no clubbing or cyanosis  Skin - normal coloration and turgor, no rashes, no suspicious skin lesions noted         Assessment/Plan:       1. Essential hypertension  - Comprehensive metabolic panel  - losartan (COZAAR) 50 MG tablet; Take 1 tablet (50 mg total) by mouth daily  Dispense: 90 tablet; Refill: 1  - Pt stable on current medication regimen, no concerns    2. Pure hypercholesterolemia  - Lipid panel    3. Screen for STD (sexually transmitted disease)  - HIV Ag/Ab 4th generation  - C. Trachomatis/N. Gonorrhoeae RNA, TMA  - Syphilis Screen IgG and IgM  - HSV Type 1 and 2 IgG        [facility] [place]metta Lovely, DO

## 2018-10-13 ENCOUNTER — Ambulatory Visit (INDEPENDENT_AMBULATORY_CARE_PROVIDER_SITE_OTHER): Payer: BC Managed Care – PPO | Admitting: Family Medicine

## 2018-10-13 ENCOUNTER — Encounter (INDEPENDENT_AMBULATORY_CARE_PROVIDER_SITE_OTHER): Payer: Self-pay | Admitting: Family Medicine

## 2018-10-13 VITALS — BP 112/70 | HR 67 | Temp 97.4°F | Wt 220.0 lb

## 2018-10-13 DIAGNOSIS — I10 Essential (primary) hypertension: Secondary | ICD-10-CM

## 2018-10-13 DIAGNOSIS — E7800 Pure hypercholesterolemia, unspecified: Secondary | ICD-10-CM

## 2018-10-13 DIAGNOSIS — Z113 Encounter for screening for infections with a predominantly sexual mode of transmission: Secondary | ICD-10-CM

## 2018-10-13 LAB — LIPID PANEL
Cholesterol / HDL Ratio: 4.3
Cholesterol: 187 mg/dL (ref 0–199)
HDL: 44 mg/dL (ref 40–9999)
LDL Calculated: 131 mg/dL — ABNORMAL HIGH (ref 0–99)
Triglycerides: 62 mg/dL (ref 34–149)
VLDL Calculated: 12 mg/dL (ref 10–40)

## 2018-10-13 LAB — HEMOLYSIS INDEX: Hemolysis Index: 7 (ref 0–18)

## 2018-10-13 LAB — SYPHILIS SCREEN IGG AND IGM: Syphilis Screen IgG and IgM: NONREACTIVE

## 2018-10-13 LAB — COMPREHENSIVE METABOLIC PANEL
ALT: 16 U/L (ref 0–55)
AST (SGOT): 17 U/L (ref 5–34)
Albumin/Globulin Ratio: 1.1 (ref 0.9–2.2)
Albumin: 3.8 g/dL (ref 3.5–5.0)
Alkaline Phosphatase: 59 U/L (ref 38–106)
BUN: 18 mg/dL (ref 9.0–28.0)
Bilirubin, Total: 0.3 mg/dL (ref 0.2–1.2)
CO2: 25 mEq/L (ref 21–29)
Calcium: 9.5 mg/dL (ref 8.5–10.5)
Chloride: 108 mEq/L (ref 100–111)
Creatinine: 0.9 mg/dL (ref 0.5–1.5)
Globulin: 3.4 g/dL (ref 2.0–3.7)
Glucose: 95 mg/dL (ref 70–100)
Potassium: 4.2 mEq/L (ref 3.5–5.1)
Protein, Total: 7.2 g/dL (ref 6.0–8.3)
Sodium: 142 mEq/L (ref 136–145)

## 2018-10-13 LAB — HIV AG/AB 4TH GENERATION: HIV Ag/Ab, 4th Generation: NONREACTIVE

## 2018-10-13 LAB — GFR: EGFR: >60

## 2018-10-13 MED ORDER — LOSARTAN POTASSIUM 50 MG PO TABS
50.0000 mg | ORAL_TABLET | Freq: Every day | ORAL | 1 refills | Status: DC
Start: 2018-10-13 — End: 2019-07-05

## 2018-10-13 NOTE — Progress Notes (Signed)
Have you seen any specialists/other providers since your last visit with us?    No    Arm preference verified?   Yes    The patient is due for [facility] letter.

## 2018-10-14 LAB — HSV TYPE 1 AND 2 IGG: HSV 1 IgG Type-Specific AB: <0.9 (ref <0.90)

## 2018-10-14 LAB — HSV TYPE 1 AND 2 ANTIBODY IGG: HSV 2 IgG Type-Specific Antibody: <0.9 (ref <0.90)

## 2018-10-15 LAB — C. TRACHOMATIS/N. GONORRHOEAE RNA, TMA
Chlamydia trachomatis RNA, TMA: NOT DETECTED
N. gonorrhoeae RNA, TMA: NOT DETECTED

## 2018-10-19 ENCOUNTER — Inpatient Hospital Stay: Payer: BC Managed Care – PPO | Attending: Nurse Practitioner

## 2018-10-19 VITALS — BP 136/88 | HR 64

## 2018-10-19 DIAGNOSIS — M549 Dorsalgia, unspecified: Secondary | ICD-10-CM | POA: Insufficient documentation

## 2018-10-19 NOTE — Progress Notes (Signed)
Name:Derrick Nixon Age: 43 y.o.   Date of Service: 10/19/2018  Referring Physician: Demetra Shiner, [place] NP   Date of Injury: 08/18/2018  Date Care Plan Established/Reviewed: 10/19/2018  Date Treatment Started: 10/19/2018  End of Certification Date: 12/17/2018  Sessions in Plan of Care: 16  Surgery Date: No data was found      Visit Count: 1   Diagnosis:   1. Mid back pain        Subjective     History of Present Illness   History of Present Illness: Pt is having midback pain. Saw a spine specialist and said the spine looks good without any nerve issues and to begin physical therapy. Complains of pain with walking >5 minutes, and standing >10 min. Pt is unable to do cardio at the gym secondary to pain, but weight lifting is fine. Pt describes the pain as achy. No N/T reported.   Functional Limitations (PLOF): Unable to walk >5 min (previously able to walk >5 minutes without pain), unable to stand for >10 min (previously able to stand for >10 min without pain), unable to exercise on an elliptical for >15 minutes (previously able to exercise on an elliptical for >15 minutes.    Social Support/Occupation      Occupation: Environmental education officer      Precautions: No data was found  Allergies: Surveyor, mining    Past Medical History:   Diagnosis Date    Gastroesophageal reflux disease     Hypertension     Kidney stones 06/2018    Lower back pain        Objective     Posture     Thoracic Spine  Hyperkyphosis.    Lumbar Spine   Flattened.     Pelvis   Posterior pelvic tilt    Integumentary   no wound, lesion or rash noted    Palpation Hypomobility present in thoracic and lumbar spine with pain to palpation of thoracic spinous processes.     Tests       Thoracic   Positive slump.   Lumbar/Pelvic Girdle/Sacrum   Positive slump.    Neurological Testing     Sensation     Lumbar   Left   Intact: light touch    Right   Intact: light touch    Reflexes   Left   Patellar (L4): normal (2+)  Achilles (S1): normal (2+)    Right   Patellar (L4):  normal (2+)  Achilles (S1): normal (2+)      BP: 136/88 Heart Rate: 64       AROM: Lumbar Spine   Initial  10/19/18      Flexion 55      Extension 10       R L R L   Rotation       Side Bending 15 p! 35 p!     (blank fields were intentionally left blank)    STRENGTH  Lumbar  MMT /5 IE R IE L R L   T.A. Activation 2/5      Heel/Toe Walk 3/5 with slight LOB      (blank fields were intentionally left blank)    Initial R   R LE Strength  PSI Initial  L   L   43.2  Hip Flexion(L1/2) 30 p!    25.9  Hip Extension 23.9    19.8  Hip Abduction 20.1    20.5  Knee Flexion (S1) 24.5    26.1  Knee  Extension (L3) 24    31.9  Ankle DF (L4) 39      Toe Ext (L5)     (blank fields were intentionally left blank)        Outcomes:  Goal:  Initial  10/19/18         Primary Functional Status Measure   49     Pain: /10 /10 /10   Pain last 24 hrs 3     Least pain last 30 days 1     Greatest pain last 30 days 5     PSFS Activities: /10 /10 /10   1. walking 5     2. standing 6     3. sitting 6           Treatment     Therapeutic Exercises   Justification: To improve ROM, Flexibility and Strength  Instructed in the following for HEP:    Exercises Seated Pelvic Tilt - 10 reps - 3 sets - 2x daily - 7x weekly   Seated Sciatic Tensioner - 15 reps - 1 sets - 2x daily - 7x weekly   Sidelying Thoracic Rotation with Open Book - 10 reps - 2 sets - 1x daily - 7x weekly   Seated Scapular Retraction - 10 reps - 3 sets - 1x daily - 7x weekly       Therapeutic Activity   Justification: To improve functional performance  Educated in proper ergonomic posture and the importance to have lumbar support while seated for long periods of time to reduce symptoms.         ---      ---   Total Time   Timed Minutes  25 minutes   Untimed Minutes  20 minutes   Total Time  45 minutes        Assessment   Derrick Nixon is a 43 y.o. male presenting with mid-back pain who requires Physical Therapy for the following:  Impairments: impaired posture  demonstrated by increased thoracic kyphosis and decreased lumbar lordosis, decreased spinal rom and joint mobility, decreased LE strength    Pain located: thoracolumbar junction    Clinical presentation: stable secondary to localized pain  Barriers to therapy: N/A  Prior Level of Function: Unable to walk >5 min (previously able to walk >5 minutes without pain), unable to stand for >10 min (previously able to stand for >10 min without pain), unable to exercise on an elliptical for >15 minutes (previously able to exercise on an elliptical for >15 minutes.  Prognosis: good  Plan   Visits per week: 2  Number of Sessions: 16  Direct One on One  09811: Therapeutic Exercise: To Develop Strength and Endurance, ROM and Flexibility  [id number]: Neuromuscular Reeducation  97140: Manual Therapy techniques (mobilization, manipulation, manual traction) (Thoracic/lumbar spine mobs all planes grade 1-5; STM/DTM/MFR to thoracic/lumbar paraspinals, QLs, gluts, hips, hamstrings, and hip flexors)  97530: Therapeutic Activities: Dynamic activities to improve functional performance  91478: Ultrasound  Dry Needling  Supervised Modalities  97010: Thermal modalities: hot/cold packs  29562: Electrical stimulation  Next Visit: thoracic/lumbar mobs, core strengthening.       Goals    Goal 1:  Patient will demonstrate independence in prescribed HEP with proper form, sets and reps for safe discharge to an independent program.   Sessions:  16      Goal 2:  Improve Physical FS Primary Measure from 49 to 67 or better to demonstrate change for significant functional improvement.      Sessions:  16      Goal 3:  Demonstrate proper posture and body mechanics with sitting so patient can sit > 1 hours.   Sessions:  16      Goal 4:  Increase abdominal strength to 4/5 MMT to allow patient to stand > 10 minutes to wash dishes or prepare meal   Sessions:  16          Goal 5:  Increase hip abduction and extensor strength 30 PSI to allow for ambulation > 5  minutes.   Sessions:  [address]                         Leone Brand, PT, DPT    Access Code: [email or web address]   URL: [email or web address]   Date: 10/19/2018   Prepared by: Leone Brand     Exercises   Seated Pelvic Tilt - 10 reps - 3 sets - 2x daily - 7x weekly   Seated Sciatic Tensioner - 15 reps - 1 sets - 2x daily - 7x weekly   Sidelying Thoracic Rotation with Open Book - 10 reps - 2 sets - 1x daily - 7x weekly   Seated Scapular Retraction - 10 reps - 3 sets - 1x daily - 7x weekly

## 2018-10-22 ENCOUNTER — Inpatient Hospital Stay
Payer: BC Managed Care – PPO | Attending: Nurse Practitioner | Admitting: Rehabilitative and Restorative Service Providers"

## 2018-10-22 DIAGNOSIS — M549 Dorsalgia, unspecified: Secondary | ICD-10-CM | POA: Insufficient documentation

## 2018-10-22 NOTE — PT/OT Therapy Note (Signed)
Name: Derrick Nixon Age: 43 y.o.   Date of Service: 10/22/2018  Referring Physician: Demetra Shiner,  NP   Date of Injury: 08/18/2018  Date Care Plan Established/Reviewed: 10/19/2018  Date Treatment Started: 10/19/2018  End of Certification Date: 12/17/2018  Sessions in Plan of Care: 16  Surgery Date: No data was found    Visit Count: 2   Diagnosis:   1. Mid back pain        Subjective     Pain   Pt reports feeling ok today, no pain reported.    Social Support/Occupation      Occupation: Environmental education officer      Precautions: No data was found  Allergies: Maple flavor                 Objective:  AROM: Lumbar Spine    Initial  10/19/18         Flexion 55         Extension 10           R L R L   Rotation           Side Bending 15 p! 35 p!       (blank fields were intentionally left blank)     STRENGTH  Lumbar  MMT /5 IE R  IE L  R  L    T.A. Activation 2/5         Heel/Toe Walk 3/5 with slight LOB         (blank fields were intentionally left blank)      Initial R     R  LE Strength  PSI Initial  L     L    43.2   Hip Flexion (L1/2) 30 p!     25.9   Hip Extension 23.9      19.8   Hip Abduction  20.1      20.5   Knee Flexion (S1)  24.5       26.1   Knee Extension (L3)  24     31.9    Ankle DF (L4)  39         Toe Ext (L5)       (blank fields were intentionally left blank)           Outcomes:  Goal:  Initial  10/19/18             Primary Functional Status Measure    49       Pain: /10 /10 /10   Pain last 24 hrs 3       Least pain last 30 days 1       Greatest pain last 30 days 5       PSFS Activities: /10 /10 /10   1. walking 5       2. standing 6       3. sitting 6            Treatment     Therapeutic Exercises   Justification: To improve ROM, Flexibility and Strength  UBE L5 X 6 min (3 min fwd/3 min backwards) w/subjective discussion    Standing cross country skier arms w/green TB x 20 w/cues to facilitate trunk rotation and avoid lumbar ext    quadraped thread the needle x 10 each side w/cues to facilitate trunk rotation and  to have eyes follow hand     Neuromuscular Re-Education   Justification: To improve motor control and proprioception  -  scap retract & PD w/blue TB x 20 each (5 sec hold w/scap retract) with verbal & tactile cues to facilitate scap depression & retraction for reaching overhead and out to side without impingement.    - prone over ball alternating UE & LE extension x 20 w/verbal & tactile cues to maintain neutral c/s and facilitate TrA recruitment for improved lumbar stability during upright daily activities    Manual Therapy   Justification: To improve soft tissue and joint mobility  Prone:  STMob (B) thoracic and lumbar paraspinals, grade III-IV PAs T6-L5       ---      ---   Total Time   Timed Minutes  40 minutes   Total Time  40 minutes        Assessment   Pt had difficulty avoiding lumbar hyperextension during ex and required frequent cueing to correct.  Decreased trunk rotation and hypomobility thoracic spine noted.    Progress towards functional goals: HEP compliant    Patient requires continued skilled care to: improve core strength and posture so pt can sit, stand and walk.  Plan   Continue with POC      Goals    Goal 1:  Patient will demonstrate independence in prescribed HEP with proper form, sets and reps for safe discharge to an independent program.   Sessions:  16      Goal 2:  Improve Physical FS Primary Measure from 49 to 67 or better to demonstrate change for significant functional improvement.      Sessions:  16      Goal 3:  Demonstrate proper posture and body mechanics with sitting so patient can sit > 1 hours.   Sessions:  16      Goal 4:  Increase abdominal strength to 4/5 MMT to allow patient to stand > 10 minutes to wash dishes or prepare meal   Sessions:  16          Goal 5:  Increase hip abduction and extensor strength 30 PSI to allow for ambulation > 5 minutes.   Sessions:  45                         Ward Givens, PT      HEP:    Exercises Seated Pelvic Tilt - 10 reps - 3 sets - 2x daily  - 7x weekly   Seated Sciatic Tensioner - 15 reps - 1 sets - 2x daily - 7x weekly   Sidelying Thoracic Rotation with Open Book - 10 reps - 2 sets - 1x daily - 7x weekly   Seated Scapular Retraction - 10 reps - 3 sets - 1x daily - 7x weekly

## 2018-10-29 ENCOUNTER — Inpatient Hospital Stay
Payer: BC Managed Care – PPO | Attending: Nurse Practitioner | Admitting: Rehabilitative and Restorative Service Providers"

## 2018-10-29 DIAGNOSIS — M549 Dorsalgia, unspecified: Secondary | ICD-10-CM

## 2018-10-29 NOTE — PT/OT Therapy Note (Signed)
Name: Derrick Nixon Age: 43 y.o.   Date of Service: 10/29/2018  Referring Physician: Demetra Shiner, [place] NP   Date of Injury: 08/18/2018  Date Care Plan Established/Reviewed: 10/19/2018  Date Treatment Started: 10/19/2018  End of Certification Date: 12/17/2018  Sessions in Plan of Care: 16  Surgery Date: No data was found    Visit Count: 3   Diagnosis:   1. Mid back pain        Subjective     Pain   Pt reports no pain today.  Reports felt good after last visit.    Social Support/Occupation      Occupation: Environmental education officer      Precautions: No data was found  Allergies: Maple flavor                       Objective:  AROM: Lumbar Spine    Initial  10/19/18         Flexion 55         Extension 10           R L R L   Rotation           Side Bending 15 p! 35 p!       (blank fields were intentionally left blank)     STRENGTH  Lumbar  MMT /5 IE R  IE L  R  L    T.A. Activation 2/5         Heel/Toe Walk 3/5 with slight LOB         (blank fields were intentionally left blank)      Initial R     R  LE Strength  PSI Initial  L     L    43.2   Hip Flexion (L1/2) 30 p!     25.9   Hip Extension 23.9      19.8   Hip Abduction  20.1      20.5   Knee Flexion (S1)  24.5       26.1   Knee Extension (L3)  24     31.9    Ankle DF (L4)  39         Toe Ext (L5)       (blank fields were intentionally left blank)           Outcomes:  Goal:  Initial  10/19/18             Primary Functional Status Measure    49       Pain: /10 /10 /10   Pain last 24 hrs 3       Least pain last 30 days 1       Greatest pain last 30 days 5       PSFS Activities: /10 /10 /10   1. walking 5       2. standing 6       3. sitting 6            Treatment     Therapeutic Exercises   Justification: To improve ROM, Flexibility and Strength  UBE L5 X 6 min (3 min fwd/3 min backwards) w/subjective discussion    Standing cross country skier arms w/green TB x 20 w/cues to facilitate trunk rotation and avoid lumbar ext    quadraped thread the needle x 15 each side w/cues to  facilitate trunk rotation and to have eyes follow hand (progressed reps)    Added: supine  thoracic stretch over foam roll (in horizontal position) w/hands behind head for neck support - will perform at home      Neuromuscular Re-Education   Justification: To improve motor control and proprioception  - scap retract & PD w/blue TB x 20 each (5 sec hold w/scap retract) with verbal & tactile cues to facilitate scap depression & retraction for reaching overhead and out to side without impingement.    - prone over ball alternating UE & LE extension x 20 w/verbal & tactile cues to maintain neutral c/s and facilitate TrA recruitment for improved lumbar stability during upright daily activities    Manual Therapy   Justification: To improve soft tissue and joint mobility  Prone:  STMob (B) thoracic and lumbar paraspinals, grade III-IV PAs T6-L5       ---      ---   Total Time   Timed Minutes  43 minutes   Total Time  43 minutes        Assessment   Pt with poor postural awareness - requires frequent cuing to avoid lumbar ext, forward, rounded shoulders and forward head when standing/sitting.  Hypomobility thoracic spine noted. Excellent effort given in clinic.    Progress towards functional goals: HEP compliant    Patient requires continued skilled care to: improve core strength and posture so pt can sit, stand and walk.  Plan   Continue with POC      Goals    Goal 1:  Patient will demonstrate independence in prescribed HEP with proper form, sets and reps for safe discharge to an independent program.   Sessions:  16      Goal 2:  Improve Physical FS Primary Measure from 49 to 67 or better to demonstrate change for significant functional improvement.      Sessions:  16      Goal 3:  Demonstrate proper posture and body mechanics with sitting so patient can sit > 1 hours.   Sessions:  16      Goal 4:  Increase abdominal strength to 4/5 MMT to allow patient to stand > 10 minutes to wash dishes or prepare meal   Sessions:  16           Goal 5:  Increase hip abduction and extensor strength 30 PSI to allow for ambulation > 5 minutes.   Sessions:  66                         Ward Givens, PT      HEP:    Exercises Seated Pelvic Tilt - 10 reps - 3 sets - 2x daily - 7x weekly   Seated Sciatic Tensioner - 15 reps - 1 sets - 2x daily - 7x weekly   Sidelying Thoracic Rotation with Open Book - 10 reps - 2 sets - 1x daily - 7x weekly   Seated Scapular Retraction - 10 reps - 3 sets - 1x daily - 7x weekly

## 2018-11-02 ENCOUNTER — Inpatient Hospital Stay: Payer: BC Managed Care – PPO | Attending: Nurse Practitioner

## 2018-11-02 DIAGNOSIS — M549 Dorsalgia, unspecified: Secondary | ICD-10-CM | POA: Insufficient documentation

## 2018-11-02 NOTE — PT/OT Therapy Note (Signed)
Name: Derrick Nixon Age: 43 y.o.   Date of Service: 11/02/2018  Referring Physician: Demetra Shiner, [place] NP   Date of Injury: 08/18/2018  Date Care Plan Established/Reviewed: 10/19/2018  Date Treatment Started: 10/19/2018  End of Certification Date: 12/17/2018  Sessions in Plan of Care: 16  Surgery Date: No data was found    Visit Count: 4   Diagnosis:   1. Mid back pain        Subjective     Pain   Pt reports he is feeling good. He feels like he is getting better, but he did a lot of driving over the weekend, so he feels a bit tighter today in the traps.     Social Support/Occupation      Occupation: Environmental education officer      Precautions: No data was found  Allergies: Maple flavor                       Objective:  AROM: Lumbar Spine    Initial  10/19/18         Flexion 55         Extension 10           R L R L   Rotation           Side Bending 15 p! 35 p!       (blank fields were intentionally left blank)     STRENGTH  Lumbar  MMT /5 IE R  IE L  R  L    T.A. Activation 2/5         Heel/Toe Walk 3/5 with slight LOB         (blank fields were intentionally left blank)      Initial R     R  LE Strength  PSI Initial  L     L    43.2   Hip Flexion (L1/2) 30 p!     25.9   Hip Extension 23.9      19.8   Hip Abduction  20.1      20.5   Knee Flexion (S1)  24.5       26.1   Knee Extension (L3)  24     31.9    Ankle DF (L4)  39         Toe Ext (L5)       (blank fields were intentionally left blank)           Outcomes:  Goal:  Initial  10/19/18             Primary Functional Status Measure    49       Pain: /10 /10 /10   Pain last 24 hrs 3       Least pain last 30 days 1       Greatest pain last 30 days 5       PSFS Activities: /10 /10 /10   1. walking 5       2. standing 6       3. sitting 6            Treatment     Therapeutic Exercises   Justification: To improve ROM, Flexibility and Strength  UBE L5 X 6 min (3 min fwd/3 min backwards) w/subjective discussion    Reviewed: Open books B 25x each    quadraped thread the needle x 15 each  side w/cues to facilitate trunk rotation and to have  eyes follow hand (progressed reps)    Thoracic extension over dowel in supine with B shoulder flex to emphasize thoracic ext x30     supine thoracic stretch over foam roll (in horizontal position) w/hands behind head for neck support 10x10 sec    Resisted ER 20x with green TB with cues to keep scapulas retracted and depressed to improve shoulder stability with carrying activities     Not done:  Standing cross country skier arms w/green TB x 20 w/cues to facilitate trunk rotation and avoid lumbar ext    Neuromuscular Re-Education   Justification: To improve motor control and proprioception  - scap retract & PD w/blue TB x 20 each (5 sec hold w/scap retract) with verbal & tactile cues to facilitate scap depression & retraction for reaching overhead and out to side without impingement.    - prone over ball alternating UE & LE extension x 20 w/verbal & tactile cues to maintain neutral c/s and facilitate TrA recruitment for improved lumbar stability during upright daily activities    -horizontal abduction 20x with green TB with cues to keep scapulas retracted and depressed to improve shoulder stability with carrying activities        Manual Therapy   Justification: To improve soft tissue and joint mobility  Prone:  STMob (B) thoracic and lumbar paraspinals, grade III-IV PAs C7-T10    IASTM to thoracic paraspinals and B UT, LS using hypervolt       ---      ---   Total Time   Timed Minutes  45 minutes   Total Time  45 minutes        Assessment   Cont hypomobility in upper thoracic spine with only slight improvements from MT. Pt able to correct posture with excessive cueing for activation of scapular/postural muscles and reduced lumbar lordosis.       Progress towards functional goals: HEP compliant    Patient requires continued skilled care to: improve core strength and posture so pt can sit, stand and walk.  Plan   Continue with POC  Cont thoracic ext  mobility      Goals    Goal 1:  Patient will demonstrate independence in prescribed HEP with proper form, sets and reps for safe discharge to an independent program.   Sessions:  16      Goal 2:  Improve Physical FS Primary Measure from 49 to 67 or better to demonstrate change for significant functional improvement.      Sessions:  16      Goal 3:  Demonstrate proper posture and body mechanics with sitting so patient can sit > 1 hours.   Sessions:  16      Goal 4:  Increase abdominal strength to 4/5 MMT to allow patient to stand > 10 minutes to wash dishes or prepare meal   Sessions:  16          Goal 5:  Increase hip abduction and extensor strength 30 PSI to allow for ambulation > 5 minutes.   Sessions:  16                         Zuleyka Kloc, PT, DPT      HEP:    Exercises Seated Pelvic Tilt - 10 reps - 3 sets - 2x daily - 7x weekly   Seated Sciatic Tensioner - 15 reps - 1 sets - 2x daily - 7x weekly   Sidelying Thoracic  Rotation with Open Book - 10 reps - 2 sets - 1x daily - 7x weekly   Seated Scapular Retraction - 10 reps - 3 sets - 1x daily - 7x weekly

## 2018-11-04 NOTE — PT/OT Therapy Note (Signed)
Name: Derrick Nixon Age: 43 y.o.   Date of Service: 11/05/2018  Referring Physician: Demetra Shiner, [place] NP   Date of Injury: 08/18/2018  Date Care Plan Established/Reviewed: 10/19/2018  Date Treatment Started: 10/19/2018  End of Certification Date: 12/17/2018  Sessions in Plan of Care: 16  Surgery Date: No data was found    Visit Count: 5   Diagnosis:   1. Mid back pain        Subjective     Pain   Pt reports no symptoms to report today.  No issues since last visit. Pt reports able to sit 60 min, able to stand > 10 min.    Social Support/Occupation      Occupation: Environmental education officer      Precautions: No data was found  Allergies: Maple flavor                       Objective:  AROM: Lumbar Spine    Initial  10/19/18         Flexion 55         Extension 10           R L R L   Rotation           Side Bending 15 p! 35 p!       (blank fields were intentionally left blank)     STRENGTH  Lumbar  MMT /5 IE R  IE L  R  L    T.A. Activation 2/5         Heel/Toe Walk 3/5 with slight LOB         (blank fields were intentionally left blank)      Initial R     R  LE Strength  PSI Initial  L     L    43.2   Hip Flexion (L1/2) 30 p!     25.9   Hip Extension 23.9      19.8   Hip Abduction  20.1      20.5   Knee Flexion (S1)  24.5       26.1   Knee Extension (L3)  24     31.9    Ankle DF (L4)  39         Toe Ext (L5)       (blank fields were intentionally left blank)     Outcomes:  Goal:  Initial  10/19/18             Primary Functional Status Measure    49       Pain: /10 /10 /10   Pain last 24 hrs 3       Least pain last 30 days 1       Greatest pain last 30 days 5       PSFS Activities: /10 /10 /10   1. walking 5       2. standing 6       3. sitting 6            Treatment     Therapeutic Exercises   Justification: To improve ROM, Flexibility and Strength  UBE L5 X 6 min (3 min fwd/3 min backwards) w/subjective discussion      quadraped thread the needle x 15 each side w/cues to facilitate trunk rotation and to have eyes follow hand  (progressed reps)    supine thoracic stretch over foam roll (in horizontal position) w/hands behind head for  neck support 10x10 sec    Resisted ER 20x with green TB with cues to keep scapulas retracted and depressed to improve shoulder stability with carrying activities     Standing cross country skier arms w/Blue TB x 20 w/cues to facilitate trunk rotation and avoid lumbar ext (progressed resistance)      Not done:  Thoracic extension over dowel in supine with B shoulder flex to emphasize thoracic ext x30     Neuromuscular Re-Education   Justification: To improve motor control and proprioception  - scap retract & PD w/blue TB x 20 each (5 sec hold w/scap retract) with verbal & tactile cues to facilitate scap depression & retraction for reaching overhead and out to side without impingement.    - prone over ball alternating UE & LE extension x 20 w/verbal & tactile cues to maintain neutral c/s and facilitate TrA recruitment for improved lumbar stability during upright daily activities    -horizontal abduction 20x with green TB with cues to keep scapulas retracted and depressed to improve shoulder stability with carrying activities        Manual Therapy   Justification: To improve soft tissue and joint mobility  Prone:  STMob (B) thoracic and lumbar paraspinals, grade III-IV PAs T1-T12           ---      ---   Total Time   Timed Minutes  42 minutes   Total Time  42 minutes        Assessment    Pt requires frequent cueing to correct posture, avoid lumbar hyperextension during ex however improving ability to self correct w/vc. Hypomobility T/S.    Progress towards functional goals: HEP compliant    Patient requires continued skilled care to: improve core strength and posture so pt can sit, stand and walk.  Plan   Continue with POC  foto v 6    Cont thoracic ext mobility      Goals    Goal 1:  Patient will demonstrate independence in prescribed HEP with proper form, sets and reps for safe discharge to an independent  program.   Sessions:  16      Goal 2:  Improve Physical FS Primary Measure from 49 to 67 or better to demonstrate change for significant functional improvement.      Sessions:  16      Goal 3:  Demonstrate proper posture and body mechanics with sitting so patient can sit > 1 hours.   Sessions:  16      Goal 4:  Increase abdominal strength to 4/5 MMT to allow patient to stand > 10 minutes to wash dishes or prepare meal   Sessions:  16          Goal 5:  Increase hip abduction and extensor strength 30 PSI to allow for ambulation > 5 minutes.   Sessions:  16                         Ward Givens, PT, DPT      HEP:    Exercises Seated Pelvic Tilt - 10 reps - 3 sets - 2x daily - 7x weekly   Seated Sciatic Tensioner - 15 reps - 1 sets - 2x daily - 7x weekly   Sidelying Thoracic Rotation with Open Book - 10 reps - 2 sets - 1x daily - 7x weekly   Seated Scapular Retraction - 10 reps - 3 sets - 1x  daily - 7x weekly

## 2018-11-05 ENCOUNTER — Inpatient Hospital Stay
Payer: BC Managed Care – PPO | Attending: Nurse Practitioner | Admitting: Rehabilitative and Restorative Service Providers"

## 2018-11-05 DIAGNOSIS — M549 Dorsalgia, unspecified: Secondary | ICD-10-CM | POA: Insufficient documentation

## 2018-11-09 ENCOUNTER — Inpatient Hospital Stay: Payer: BC Managed Care – PPO | Attending: Nurse Practitioner

## 2018-11-09 DIAGNOSIS — M549 Dorsalgia, unspecified: Secondary | ICD-10-CM | POA: Insufficient documentation

## 2018-11-09 NOTE — PT/OT Therapy Note (Signed)
Name: Derrick Nixon Age: 43 y.o.   Date of Service: 11/09/2018  Referring Physician: Demetra Shiner, [place] NP   Date of Injury: 08/18/2018  Date Care Plan Established/Reviewed: 10/19/2018  Date Treatment Started: 10/19/2018  End of Certification Date: 12/17/2018  Sessions in Plan of Care: 16  Surgery Date: No data was found    Visit Count: 6   Diagnosis:   1. Mid back pain        Subjective     Pain   Pt reports no pain today and has been feeling good. He did have some pain the evening after his last visit.     Social Support/Occupation      Occupation: Environmental education officer      Precautions: No data was found  Allergies: Maple flavor                       Objective:  AROM: Lumbar Spine    Initial  10/19/18         Flexion 55         Extension 10           R L R L   Rotation           Side Bending 15 p! 35 p!       (blank fields were intentionally left blank)     STRENGTH  Lumbar  MMT /5 IE R  IE L  R  L    T.A. Activation 2/5         Heel/Toe Walk 3/5 with slight LOB         (blank fields were intentionally left blank)      Initial R     R  LE Strength  PSI Initial  L     L    43.2   Hip Flexion (L1/2) 30 p!     25.9   Hip Extension 23.9      19.8   Hip Abduction  20.1      20.5   Knee Flexion (S1)  24.5       26.1   Knee Extension (L3)  24     31.9    Ankle DF (L4)  39         Toe Ext (L5)       (blank fields were intentionally left blank)     Outcomes:  Goal:  Initial  10/19/18             Primary Functional Status Measure    49       Pain: /10 /10 /10   Pain last 24 hrs 3       Least pain last 30 days 1       Greatest pain last 30 days 5       PSFS Activities: /10 /10 /10   1. walking 5       2. standing 6       3. sitting 6            Treatment     Therapeutic Exercises   Justification: To improve ROM, Flexibility and Strength  UBE L5 X 6 min (3 min fwd/3 min backwards) w/subjective discussion    B open books 3x10    supine thoracic stretch over foam roll (in horizontal position) w/hands behind head for neck support 10x10  sec    Not done:  quadraped thread the needle x 15 each side w/cues to facilitate trunk rotation  and to have eyes follow hand (progressed reps)    Resisted ER 20x with green TB with cues to keep scapulas retracted and depressed to improve shoulder stability with carrying activities     Thoracic extension over dowel in supine with B shoulder flex to emphasize thoracic ext x30     Standing cross country skier arms w/Blue TB x 20 w/cues to facilitate trunk rotation and avoid lumbar ext (progressed resistance)    Neuromuscular Re-Education   Justification: To improve motor control and proprioception  -Touchdowns 2x10 facing wall with verbal/tactile cues to resist IR of B shoulders and to keep forearms in line with shoulders to help promote facilitation of SA, mid/lower traps to improve stabilization of proper posture    -Prone T's over physioball with verbal/tactile cues to keep proper scapular positioning to facilitate mid/lower trap activation 3x10    Not done:  - scap retract & PD w/blue TB x 20 each (5 sec hold w/scap retract) with verbal & tactile cues to facilitate scap depression & retraction for reaching overhead and out to side without impingement.    - prone over ball alternating UE & LE extension x 20 w/verbal & tactile cues to maintain neutral c/s and facilitate TrA recruitment for improved lumbar stability during upright daily activities    -horizontal abduction 20x with green TB with cues to keep scapulas retracted and depressed to improve shoulder stability with carrying activities        Manual Therapy   Justification: To improve soft tissue and joint mobility  Prone:  STMob (B) thoracic and lumbar paraspinals and UT, grade III-IV PAs T1-T12     IASTM to thoracic/lumbar paraspinals, UTs using hypervolt           ---      ---   Total Time   Timed Minutes  45 minutes   Total Time  45 minutes        Assessment   Cont hypomobility in thoracic spine (upper > lower). Restricted tissue extensibility in B UTs  and lower thoracic/upper lumbar paraspinals, but improved when addressed with MT. Pt tolerated new NMR exercises well with normal mm fatigue in low traps and B serratus anterior.     Progress towards functional goals: HEP compliant    Patient requires continued skilled care to: improve core strength and posture so pt can sit, stand and walk.  Plan   Continue with POC  Cont thoracic ext mobility  FOTO      Goals    Goal 1:  Patient will demonstrate independence in prescribed HEP with proper form, sets and reps for safe discharge to an independent program.   Sessions:  16      Goal 2:  Improve Physical FS Primary Measure from 49 to 67 or better to demonstrate change for significant functional improvement.      Sessions:  16      Goal 3:  Demonstrate proper posture and body mechanics with sitting so patient can sit > 1 hours.   Sessions:  16      Goal 4:  Increase abdominal strength to 4/5 MMT to allow patient to stand > 10 minutes to wash dishes or prepare meal   Sessions:  16          Goal 5:  Increase hip abduction and extensor strength 30 PSI to allow for ambulation > 5 minutes.   Sessions:  16  Leone Brand, PT, DPT      HEP:    Exercises Seated Pelvic Tilt - 10 reps - 3 sets - 2x daily - 7x weekly   Seated Sciatic Tensioner - 15 reps - 1 sets - 2x daily - 7x weekly   Sidelying Thoracic Rotation with Open Book - 10 reps - 2 sets - 1x daily - 7x weekly   Seated Scapular Retraction - 10 reps - 3 sets - 1x daily - 7x weekly

## 2018-11-11 NOTE — PT/OT Therapy Note (Signed)
Name: Derrick Nixon Age: 43 y.o.   Date of Service: 11/12/2018  Referring Physician: Demetra Shiner,  NP   Date of Injury: 08/18/2018  Date Care Plan Established/Reviewed: 10/19/2018  Date Treatment Started: 10/19/2018  End of Certification Date: 12/17/2018  Sessions in Plan of Care: 16  Surgery Date: No data was found    Visit Count: 7   Diagnosis:   1. Mid back pain        Subjective     Pain   Pt reports hes doing well today.    Social Support/Occupation      Occupation: Environmental education officer      Precautions: No data was found  Allergies: Maple flavor                       Objective:  AROM: Lumbar Spine    Initial  10/19/18         Flexion 55         Extension 10           R L R L   Rotation           Side Bending 15 p! 35 p!       (blank fields were intentionally left blank)     STRENGTH  Lumbar  MMT /5 IE R  IE L  R  L    T.A. Activation 2/5         Heel/Toe Walk 3/5 with slight LOB         (blank fields were intentionally left blank)      Initial R     R  LE Strength  PSI Initial  L     L    43.2   Hip Flexion (L1/2) 30 p!     25.9   Hip Extension 23.9      19.8   Hip Abduction  20.1      20.5   Knee Flexion (S1)  24.5       26.1   Knee Extension (L3)  24     31.9    Ankle DF (L4)  39         Toe Ext (L5)       (blank fields were intentionally left blank)     Outcomes:  Goal:  Initial  10/19/18  11/11/18           Primary Functional Status Measure    49 67      Pain: /10 /10 /10   Pain last 24 hrs 3  3     Least pain last 30 days 1  1     Greatest pain last 30 days 5 6      PSFS Activities: /10 /10 /10   1. walking 5  4     2. standing 6 7      3. sitting 6 2           Treatment     Therapeutic Exercises   Justification: To improve ROM, Flexibility and Strength  UBE L5 X 6 min (3 min fwd/3 min backwards) w/subjective discussion        supine thoracic stretch over foam roll (in horizontal position) w/hands behind head for neck support 10x10 sec    Not done:  B open books 3x10  quadraped thread the needle x 15 each side  w/cues to facilitate trunk rotation and to have eyes follow hand (progressed reps)    Resisted ER 20x  with green TB with cues to keep scapulas retracted and depressed to improve shoulder stability with carrying activities     Thoracic extension over dowel in supine with B shoulder flex to emphasize thoracic ext x30     Standing cross country skier arms w/Blue TB x 20 w/cues to facilitate trunk rotation and avoid lumbar ext (progressed resistance)    Neuromuscular Re-Education   Justification: To improve motor control and proprioception  - Prone over ball "T"  3 x 10 each w/5 sec holdw/verbal & tactile cues to facilitate scapular depression & retraction w/UT inhibition and to maintain neutral c/s for improved control w/ overhead lifting.    - scap retract & PD w/blue TB x 20 each (5 sec hold w/scap retract) with verbal & tactile cues to facilitate scap depression & retraction for reaching overhead and out to side without impingement.    - prone over ball alternating UE & LE extension x 20 w/verbal & tactile cues to maintain neutral c/s and facilitate TrA recruitment for improved lumbar stability during upright daily activities    -horizontal abduction 20x with green TB with cues to keep scapulas retracted and depressed to improve shoulder stability with carrying activities      Not done:  -Touchdowns 2x10 facing wall with verbal/tactile cues to resist IR of B shoulders and to keep forearms in line with shoulders to help promote facilitation of SA, mid/lower traps to improve stabilization of proper posture    Manual Therapy   Justification: To improve soft tissue and joint mobility  Prone:  STMob (B) thoracic and lumbar paraspinals and UT, grade III-IV PAs T1-T12                ---      ---   Total Time   Timed Minutes  40 minutes   Total Time  40 minutes        Assessment   Improved functional outcomes with decreased pain reported.  Continues to require vc and tc to maintain neutral c/s. T/s, L/S during ex however  better able to self correct w/cueing.     Progress towards functional goals: improved foto    Patient requires continued skilled care to: improve core strength and posture so pt can sit, stand and walk.  Plan   Continue with POC  Cont thoracic ext mobility    Assess hip strength      Goals    Goal 1:  Patient will demonstrate independence in prescribed HEP with proper form, sets and reps for safe discharge to an independent program.   Sessions:  16      Goal 2:  Improve Physical FS Primary Measure from 49 to 67 or better to demonstrate change for significant functional improvement.     Assessed:  11/11/18   Sessions:  16   Progression:  met       Goal 3:  Demonstrate proper posture and body mechanics with sitting so patient can sit > 1 hours.   Sessions:  16      Goal 4:  Increase abdominal strength to 4/5 MMT to allow patient to stand > 10 minutes to wash dishes or prepare meal   Sessions:  16          Goal 5:  Increase hip abduction and extensor strength 30 PSI to allow for ambulation > 5 minutes.   Sessions:  16  Ward Givens, PT, DPT      HEP:    Exercises Seated Pelvic Tilt - 10 reps - 3 sets - 2x daily - 7x weekly   Seated Sciatic Tensioner - 15 reps - 1 sets - 2x daily - 7x weekly   Sidelying Thoracic Rotation with Open Book - 10 reps - 2 sets - 1x daily - 7x weekly   Seated Scapular Retraction - 10 reps - 3 sets - 1x daily - 7x weekly

## 2018-11-12 ENCOUNTER — Inpatient Hospital Stay
Payer: BC Managed Care – PPO | Attending: Nurse Practitioner | Admitting: Rehabilitative and Restorative Service Providers"

## 2018-11-12 DIAGNOSIS — M549 Dorsalgia, unspecified: Secondary | ICD-10-CM | POA: Insufficient documentation

## 2018-11-16 ENCOUNTER — Inpatient Hospital Stay: Payer: BC Managed Care – PPO

## 2018-12-14 NOTE — PT/OT Therapy Note (Signed)
Discontinuation of Therapy Services    [name] did not complete prescribed physical therapy visits due to COVID19 and social distancing precautions.      Physical therapy has been discontinued at this time and patient has been discharged from care.   Patient has been given a HEP and has been instructed to contact MD or PT if needed.  The last therapy note is below for review.    Please feel free to contact me with any questions regarding the care of Derrick Nixon.    Sincerely,    Lorelle Formosa. Colonel Bald, PT, DPT, CLT-UE  [phone]

## 2019-07-05 ENCOUNTER — Encounter (INDEPENDENT_AMBULATORY_CARE_PROVIDER_SITE_OTHER): Payer: Self-pay | Admitting: Family Medicine

## 2019-07-05 ENCOUNTER — Ambulatory Visit (INDEPENDENT_AMBULATORY_CARE_PROVIDER_SITE_OTHER): Payer: BC Managed Care – PPO | Admitting: Family Medicine

## 2019-07-05 VITALS — BP 122/78 | HR 66 | Temp 97.6°F | Ht 70.0 in | Wt 230.0 lb

## 2019-07-05 DIAGNOSIS — I10 Essential (primary) hypertension: Secondary | ICD-10-CM

## 2019-07-05 DIAGNOSIS — Z0000 Encounter for general adult medical examination without abnormal findings: Secondary | ICD-10-CM

## 2019-07-05 DIAGNOSIS — K219 Gastro-esophageal reflux disease without esophagitis: Secondary | ICD-10-CM

## 2019-07-05 DIAGNOSIS — M5431 Sciatica, right side: Secondary | ICD-10-CM

## 2019-07-05 DIAGNOSIS — M7918 Myalgia, other site: Secondary | ICD-10-CM

## 2019-07-05 DIAGNOSIS — L409 Psoriasis, unspecified: Secondary | ICD-10-CM

## 2019-07-05 DIAGNOSIS — E7800 Pure hypercholesterolemia, unspecified: Secondary | ICD-10-CM

## 2019-07-05 LAB — CBC AND DIFFERENTIAL
Absolute NRBC: 0 10*3/uL (ref 0.00–0.00)
Basophils Absolute Automated: 0.06 10*3/uL (ref 0.00–0.08)
Basophils Automated: 0.8 %
Eosinophils Absolute Automated: 0 10*3/uL (ref 0.00–0.44)
Eosinophils Automated: 0 %
Hematocrit: 44.6 % (ref 37.6–49.6)
Hgb: 15 g/dL (ref 12.5–17.1)
Immature Granulocytes Absolute: 0.02 10*3/uL (ref 0.00–0.07)
Immature Granulocytes: 0.3 %
Lymphocytes Absolute Automated: 2.51 10*3/uL (ref 0.42–3.22)
Lymphocytes Automated: 33.5 %
MCH: 29.4 pg (ref 25.1–33.5)
MCHC: 33.6 g/dL (ref 31.5–35.8)
MCV: 87.5 fL (ref 78.0–96.0)
MPV: 10.9 fL (ref 8.9–12.5)
Monocytes Absolute Automated: 0.64 10*3/uL (ref 0.21–0.85)
Monocytes: 8.5 %
Neutrophils Absolute: 4.27 10*3/uL (ref 1.10–6.33)
Neutrophils: 56.9 %
Nucleated RBC: 0 /100 WBC (ref 0.0–0.0)
Platelets: 189 10*3/uL (ref 142–346)
RBC: 5.1 10*6/uL (ref 4.20–5.90)
RDW: 12 % (ref 11–15)
WBC: 7.5 10*3/uL (ref 3.10–9.50)

## 2019-07-05 LAB — GFR: EGFR: >60

## 2019-07-05 LAB — COMPREHENSIVE METABOLIC PANEL
ALT: 22 U/L (ref 0–55)
AST (SGOT): 28 U/L (ref 5–34)
Albumin/Globulin Ratio: 1.1 (ref 0.9–2.2)
Albumin: 4.1 g/dL (ref 3.5–5.0)
Alkaline Phosphatase: 59 U/L (ref 38–106)
Anion Gap: 9 (ref 5.0–15.0)
BUN: 14 mg/dL (ref 9.0–28.0)
Bilirubin, Total: 0.5 mg/dL (ref 0.2–1.2)
CO2: 26 mEq/L (ref 21–29)
Calcium: 9.4 mg/dL (ref 8.5–10.5)
Chloride: 103 mEq/L (ref 100–111)
Creatinine: 1 mg/dL (ref 0.5–1.5)
Globulin: 3.7 g/dL (ref 2.0–3.7)
Glucose: 95 mg/dL (ref 70–100)
Potassium: 4.3 mEq/L (ref 3.5–5.1)
Protein, Total: 7.8 g/dL (ref 6.0–8.3)
Sodium: 138 mEq/L (ref 136–145)

## 2019-07-05 LAB — HEMOGLOBIN A1C
Average Estimated Glucose: 102.5 mg/dL
Hemoglobin A1C: 5.2 % (ref 4.6–5.9)

## 2019-07-05 LAB — LIPID PANEL
Cholesterol / HDL Ratio: 5.3
Cholesterol: 231 mg/dL — ABNORMAL HIGH (ref 0–199)
HDL: 44 mg/dL (ref 40–9999)
LDL Calculated: 157 mg/dL — ABNORMAL HIGH (ref 0–99)
Triglycerides: 152 mg/dL — ABNORMAL HIGH (ref 34–149)
VLDL Calculated: 30 mg/dL (ref 10–40)

## 2019-07-05 LAB — MICROALBUMIN, RANDOM URINE
Urine Creatinine, Random: 133.4 mg/dL
Urine Microalbumin, Random: 12 (ref 0.0–30.0)
Urine Microalbumin/Creatinine Ratio: 9 ug/mg (ref 0–30)

## 2019-07-05 LAB — HEMOLYSIS INDEX: Hemolysis Index: 5 (ref 0–18)

## 2019-07-05 LAB — TSH: TSH: 0.8 u[IU]/mL (ref 0.35–4.94)

## 2019-07-05 MED ORDER — TRIAMCINOLONE ACETONIDE 0.1 % EX CREA
TOPICAL_CREAM | Freq: Two times a day (BID) | CUTANEOUS | 3 refills | Status: AC
Start: 2019-07-05 — End: ?

## 2019-07-05 NOTE — Progress Notes (Signed)
Date: 07/05/2019 11:50 AM   Patient ID: Derrick Nixon is a 43 y.o. male.         Have you seen any specialists/other providers since your last visit with [place]?  Yes (Podiatrist- Dr. Sammuel Hines)  Arm preference verified? Yes  The patient is due for pcmh letter    Subjective:      Chief Complaint:  Chief Complaint   Patient presents with    Annual Exam     non-fasting; Right glute pain, that radiates down R-leg after sitting long periods of time.        HPI:  Visit Type: Health Maintenance Visit  Work Status: working full-time, Health visitor  Reported Health: good health  Reported Diet: compliant with well-balanced diet  Reported Exercise: 1-2x/week, 30-60 minutes/day and jogging/running  Dental: regular dental visits twice a year  Vision: glasses and regular eye exams   Hearing: normal hearing  Immunization Status: immunizations up to date  Reproductive Health: not currently sexually active  Prior Screening Tests: last PSA 1 year ago, no previous colorectal cancer screening and no previous dexa scan  General Health Risks: no family history of prostate cancer, no family history of colon cancer and no family history of breast cancer  Safety Elements Used: uses seat belts, smoke detectors in household, carbon monoxide detectors in household, sunscreen use and does not text and drive      Problem List:  Patient Active Problem List   Diagnosis    Anxiety    Essential hypertension    Pure hypercholesterolemia    GERD without esophagitis    Chronic midline low back pain without sciatica    Psoriasis       Current Medications:  No outpatient medications have been marked as taking for the 07/05/19 encounter (Office Visit) with Peterson Ao, DO.       Allergies:  Allergies   Allergen Reactions    Maple Flavor      Maple tree, and maple syrup: rash, tongue numbness, and swelling.        Past Medical History:  Past Medical History:   Diagnosis Date    Gastroesophageal reflux disease      Hypertension     Kidney stones 06/2018    Lower back pain        Past Surgical History:  History reviewed. No pertinent surgical history.    Family History:  Family History   Problem Relation Age of Onset    Cancer Mother     Diabetes Mother     Ovarian cancer Mother     Cancer Father     Liver cancer Father        Social History:  Social History     Tobacco Use    Smoking status: Never Smoker    Smokeless tobacco: Never Used   Substance Use Topics    Alcohol use: No    Drug use: No       The following sections were reviewed this encounter by the provider: Meds     Problems          Vitals:  BP 122/78    Pulse 66    Temp 97.6 F (36.4 C) (Temporal)    Ht 1.778 m (5\' 10" )    Wt 104.3 kg (230 lb)    SpO2 97%    BMI 33.00 kg/m        ROS:  General ROS: negative for - chills, fatigue, fever, hot flashes, malaise, night  sweats, sleep disturbance, weight gain or weight loss  Psychological ROS: negative for - anxiety, concentration difficulties, depression, hallucinations or memory difficulties  Ophthalmic ROS: negative for - blurry vision, double vision, eye pain or photophobia  ENT ROS: negative for - hearing change, nasal congestion, nasal discharge, sinus pain, sore throat, tinnitus or vertigo  Allergy and Immunology ROS: negative for - hives, itchy/watery eyes, nasal congestion, postnasal drip or seasonal allergies  Hematological and Lymphatic ROS: negative for - bleeding problems, bruising, fatigue, night sweats, swollen lymph nodes or weight loss  Endocrine ROS: negative for - hot flashes, polydipsia/polyuria, temperature intolerance or unexpected weight changes  Respiratory ROS: negative for - cough, orthopnea, shortness of breath, sputum changes or wheezing  Cardiovascular ROS: negative for - chest pain, edema, irregular heartbeat, palpitations or shortness of breath  Gastrointestinal ROS: negative for - abdominal pain, appetite loss, blood in stools, constipation, diarrhea, gas/bloating,  nausea/vomiting; positive for - intermittent heartburn  Genito-Urinary ROS: negative for - dysuria, hematuria or urinary frequency/urgency  Musculoskeletal ROS: negative for - joint pain, joint swelling, muscle pain or muscular weakness  Neurological ROS: negative for - dizziness, headaches; positive for - numbness/tingling (right leg)  Dermatological ROS: negative for - dry skin, pruritus and rash      Objective:       Physical Exam:  General appearance - alert, well appearing, and in no distress, oriented to person, place, and time and normal appearing weight  Mental status - alert, oriented to person, place, and time, normal mood, behavior, speech, dress, motor activity, and thought processes  Eyes - pupils equal and reactive, extraocular eye movements intact  Ears - bilateral TM's and external ear canals normal  Nose - normal and patent, no erythema, discharge or polyps and normal nontender sinuses  Mouth - mucous membranes moist, pharynx normal without lesions  Neck - supple, no significant adenopathy, no cervical lymphadenopathy  Chest - clear to auscultation, no wheezes, rales or rhonchi, symmetric air entry  Heart - normal rate, regular rhythm, normal S1, S2, no murmurs, rubs, clicks or gallops  Abdomen - soft, nontender, nondistended, no masses or organomegaly  Neurological - alert, oriented, normal speech, no focal findings or movement disorder noted  Musculoskeletal - no joint tenderness, deformity or swelling  Extremities - peripheral pulses normal, no pedal edema, no clubbing or cyanosis  Skin - normal coloration and turgor, no rashes, no suspicious skin lesions noted      Assessment/Plan:       1. Well adult exam  - CBC and differential  - Comprehensive metabolic panel  - TSH  - Lipid panel  - Hemoglobin A1C    2. Essential hypertension  - Comprehensive metabolic panel  - Urine Microalbumin Random    3. GERD without esophagitis    4. Pure hypercholesterolemia  - Lipid panel    5. Gluteal pain  -  Referral to Physical Therapy-[place]    6. Right sciatic nerve pain  - Referral to Physical Therapy-[place]    7. Psoriasis  - triamcinolone (KENALOG) 0.1 % cream; Apply topically 2 (two) times daily  Dispense: 45 g; Refill: 3          Landrie Beale Vernette Almetta Lovely, DO

## 2019-07-26 ENCOUNTER — Encounter (INDEPENDENT_AMBULATORY_CARE_PROVIDER_SITE_OTHER): Payer: Self-pay | Admitting: Urology

## 2019-08-02 ENCOUNTER — Other Ambulatory Visit (INDEPENDENT_AMBULATORY_CARE_PROVIDER_SITE_OTHER): Payer: Self-pay | Admitting: Urology

## 2019-08-02 DIAGNOSIS — N200 Calculus of kidney: Secondary | ICD-10-CM

## 2019-08-03 ENCOUNTER — Encounter (INDEPENDENT_AMBULATORY_CARE_PROVIDER_SITE_OTHER): Payer: Self-pay

## 2019-08-03 ENCOUNTER — Telehealth (INDEPENDENT_AMBULATORY_CARE_PROVIDER_SITE_OTHER): Payer: Self-pay

## 2019-08-03 NOTE — Telephone Encounter (Signed)
Error

## 2019-08-04 ENCOUNTER — Telehealth (INDEPENDENT_AMBULATORY_CARE_PROVIDER_SITE_OTHER): Payer: Self-pay | Admitting: Urology

## 2019-08-04 NOTE — Telephone Encounter (Signed)
Spoke to patient.  Reviewed CT results.  Asked him to drop off disc for me to review.

## 2019-08-04 NOTE — Telephone Encounter (Signed)
PT INFORMED

## 2019-08-24 ENCOUNTER — Telehealth (INDEPENDENT_AMBULATORY_CARE_PROVIDER_SITE_OTHER): Payer: Self-pay | Admitting: Urology

## 2019-09-01 NOTE — Telephone Encounter (Signed)
Please let patient know that I had trouble opening the disc.  I brought it to radiology to have them upload it for me and I will touch base with him when I have a chance to review it after the holidy

## 2019-09-01 NOTE — Telephone Encounter (Signed)
Called patient. No answer. Unable to leave call back message.

## 2019-09-06 NOTE — Telephone Encounter (Signed)
Patient informed. 

## 2020-01-03 ENCOUNTER — Other Ambulatory Visit (INDEPENDENT_AMBULATORY_CARE_PROVIDER_SITE_OTHER): Payer: Self-pay | Admitting: Family Medicine

## 2020-05-30 ENCOUNTER — Other Ambulatory Visit (INDEPENDENT_AMBULATORY_CARE_PROVIDER_SITE_OTHER): Payer: Self-pay | Admitting: Family Medicine

## 2024-01-27 ENCOUNTER — Other Ambulatory Visit (INDEPENDENT_AMBULATORY_CARE_PROVIDER_SITE_OTHER): Payer: Self-pay
